# Patient Record
Sex: Female | Born: 1960 | Race: White | Hispanic: No | Smoking: Never smoker
Health system: Southern US, Community
[De-identification: ages and names within clinical notes are randomized; demographics above are authoritative.]

## PROBLEM LIST (undated history)

## (undated) DIAGNOSIS — R002 Palpitations: Secondary | ICD-10-CM

## (undated) DIAGNOSIS — I499 Cardiac arrhythmia, unspecified: Secondary | ICD-10-CM

## (undated) DIAGNOSIS — G4733 Obstructive sleep apnea (adult) (pediatric): Secondary | ICD-10-CM

## (undated) DIAGNOSIS — K219 Gastro-esophageal reflux disease without esophagitis: Secondary | ICD-10-CM

## (undated) DIAGNOSIS — I480 Paroxysmal atrial fibrillation: Secondary | ICD-10-CM

## (undated) DIAGNOSIS — Z87442 Personal history of urinary calculi: Secondary | ICD-10-CM

## (undated) DIAGNOSIS — F419 Anxiety disorder, unspecified: Secondary | ICD-10-CM

## (undated) DIAGNOSIS — E538 Deficiency of other specified B group vitamins: Secondary | ICD-10-CM

## (undated) DIAGNOSIS — T884XXA Failed or difficult intubation, initial encounter: Secondary | ICD-10-CM

## (undated) HISTORY — DX: Palpitations: R00.2

## (undated) HISTORY — DX: Obstructive sleep apnea (adult) (pediatric): G47.33

## (undated) HISTORY — PX: ESOPHAGOGASTRODUODENOSCOPY: SHX1529

## (undated) HISTORY — DX: Deficiency of other specified B group vitamins: E53.8

## (undated) HISTORY — PX: COLONOSCOPY: SHX174

## (undated) HISTORY — DX: Paroxysmal atrial fibrillation: I48.0

---

## 2001-01-25 HISTORY — PX: AUGMENTATION MAMMAPLASTY: SUR837

## 2003-01-26 HISTORY — PX: BREAST BIOPSY: SHX20

## 2004-01-26 HISTORY — PX: OTHER SURGICAL HISTORY: SHX169

## 2005-01-25 HISTORY — PX: REDUCTION MAMMAPLASTY: SUR839

## 2013-02-27 ENCOUNTER — Ambulatory Visit: Payer: Self-pay | Admitting: Internal Medicine

## 2013-07-21 DIAGNOSIS — E538 Deficiency of other specified B group vitamins: Secondary | ICD-10-CM | POA: Insufficient documentation

## 2014-02-27 DIAGNOSIS — N950 Postmenopausal bleeding: Secondary | ICD-10-CM | POA: Insufficient documentation

## 2015-07-21 DIAGNOSIS — I480 Paroxysmal atrial fibrillation: Secondary | ICD-10-CM | POA: Insufficient documentation

## 2015-07-21 DIAGNOSIS — F419 Anxiety disorder, unspecified: Secondary | ICD-10-CM | POA: Insufficient documentation

## 2015-12-25 ENCOUNTER — Other Ambulatory Visit: Payer: Self-pay | Admitting: Student

## 2015-12-25 DIAGNOSIS — R1310 Dysphagia, unspecified: Secondary | ICD-10-CM

## 2016-01-09 ENCOUNTER — Ambulatory Visit
Admission: RE | Admit: 2016-01-09 | Discharge: 2016-01-09 | Disposition: A | Discharge status: Home / Self Care | Payer: BLUE CROSS/BLUE SHIELD | Source: Ambulatory Visit | Attending: Student | Admitting: Student

## 2016-01-09 DIAGNOSIS — K449 Diaphragmatic hernia without obstruction or gangrene: Secondary | ICD-10-CM | POA: Insufficient documentation

## 2016-01-09 DIAGNOSIS — R1310 Dysphagia, unspecified: Secondary | ICD-10-CM | POA: Diagnosis present

## 2016-01-16 ENCOUNTER — Ambulatory Visit
Admission: RE | Admit: 2016-01-16 | Payer: BLUE CROSS/BLUE SHIELD | Source: Ambulatory Visit | Admitting: Unknown Physician Specialty

## 2016-01-16 ENCOUNTER — Encounter: Admission: RE | Payer: Self-pay | Source: Ambulatory Visit

## 2016-01-16 SURGERY — ESOPHAGOGASTRODUODENOSCOPY (EGD) WITH PROPOFOL
Anesthesia: General

## 2016-04-06 DIAGNOSIS — G4733 Obstructive sleep apnea (adult) (pediatric): Secondary | ICD-10-CM | POA: Insufficient documentation

## 2016-04-06 DIAGNOSIS — Z9989 Dependence on other enabling machines and devices: Secondary | ICD-10-CM

## 2016-04-06 DIAGNOSIS — R002 Palpitations: Secondary | ICD-10-CM | POA: Insufficient documentation

## 2016-04-06 DIAGNOSIS — Z780 Asymptomatic menopausal state: Secondary | ICD-10-CM | POA: Insufficient documentation

## 2016-05-11 ENCOUNTER — Other Ambulatory Visit: Payer: Self-pay | Admitting: Internal Medicine

## 2016-05-11 DIAGNOSIS — Z1231 Encounter for screening mammogram for malignant neoplasm of breast: Secondary | ICD-10-CM

## 2016-06-02 ENCOUNTER — Other Ambulatory Visit: Payer: Self-pay | Admitting: Internal Medicine

## 2016-06-02 ENCOUNTER — Ambulatory Visit
Admission: RE | Admit: 2016-06-02 | Discharge: 2016-06-02 | Disposition: A | Discharge status: Home / Self Care | Payer: BLUE CROSS/BLUE SHIELD | Source: Ambulatory Visit | Attending: Internal Medicine | Admitting: Internal Medicine

## 2016-06-02 DIAGNOSIS — Z1231 Encounter for screening mammogram for malignant neoplasm of breast: Secondary | ICD-10-CM

## 2016-09-28 ENCOUNTER — Other Ambulatory Visit: Payer: Self-pay | Admitting: Internal Medicine

## 2016-09-28 DIAGNOSIS — R1013 Epigastric pain: Secondary | ICD-10-CM

## 2017-05-13 ENCOUNTER — Other Ambulatory Visit: Payer: Self-pay | Admitting: Student

## 2017-05-13 DIAGNOSIS — M7050 Other bursitis of knee, unspecified knee: Secondary | ICD-10-CM

## 2017-05-24 DIAGNOSIS — F5104 Psychophysiologic insomnia: Secondary | ICD-10-CM | POA: Insufficient documentation

## 2017-08-08 ENCOUNTER — Encounter
Admission: RE | Admit: 2017-08-08 | Discharge: 2017-08-08 | Disposition: A | Discharge status: Home / Self Care | Payer: BLUE CROSS/BLUE SHIELD | Source: Ambulatory Visit | Attending: Orthopedic Surgery | Admitting: Orthopedic Surgery

## 2017-08-08 ENCOUNTER — Other Ambulatory Visit: Payer: Self-pay

## 2017-08-08 HISTORY — DX: Personal history of urinary calculi: Z87.442

## 2017-08-08 HISTORY — DX: Failed or difficult intubation, initial encounter: T88.4XXA

## 2017-08-08 HISTORY — DX: Gastro-esophageal reflux disease without esophagitis: K21.9

## 2017-08-08 HISTORY — DX: Anxiety disorder, unspecified: F41.9

## 2017-08-08 NOTE — Pre-Procedure Instructions (Signed)
ECG 12-lead3/13/2018 [facility] Component Name Value Ref Range  Vent Rate (bpm) 57   PR Interval (msec) 130   QRS Interval (msec) 92   QT Interval (msec) 462   QTc (msec) 449   Result Narrative  Sinus bradycardia at 57 bpm, leftward axis, normal intervals, Possible Left atrial enlargement Left ventricular hypertrophy Abnormal ECG No previous ECGs available I reviewed and concur with this report. Electronically signed [email or web address]:XWRUEby:SINGH MD, JASMINE ([id number]) on 05/02/2016 7:49:02 AM  Status Results Details   Encounter Summary

## 2017-08-08 NOTE — Patient Instructions (Signed)
Your procedure is scheduled on: 08-12-17 FRIDAY Report to Same Day Surgery 2nd floor medical mall [facility](Medical Mall Entrance-take elevator on left to 2nd floor.  Check in with surgery information desk.) To find out your arrival time please call [phone] between 1PM - 3PM on 08-11-17 THURSDAY  Remember: Instructions that are not followed completely may result in serious medical risk, up to and including death, or upon the discretion of your surgeon and anesthesiologist your surgery may need to be rescheduled.    _x___ 1. Do not eat food after midnight the night before your procedure. NO GUM OR CANDY AFTER MIDNIGHT.  You may drink clear liquids up to 2 hours before you are scheduled to arrive at the hospital for your procedure.  Do not drink clear liquids within 2 hours of your scheduled arrival to the hospital.  Clear liquids include  --Water or Apple juice without pulp  --Clear carbohydrate beverage such as ClearFast or Gatorade  --Black Coffee or Clear Tea (No milk, no creamers, do not add anything to the coffee or Tea    __x__ 2. No Alcohol for 24 hours before or after surgery.   __x__3. No Smoking or e-cigarettes for 24 prior to surgery.  Do not use any chewable tobacco products for at least 6 hour prior to surgery   ____  4. Bring all medications with you on the day of surgery if instructed.    __x__ 5. Notify your doctor if there is any change in your medical condition     (cold, fever, infections).    x___6. On the morning of surgery brush your teeth with toothpaste and water.  You may rinse your mouth with mouth wash if you wish.  Do not swallow any toothpaste or mouthwash.   Do not wear jewelry, make-up, hairpins, clips or nail polish.  Do not wear lotions, powders, or perfumes. You may wear deodorant.  Do not shave 48 hours prior to surgery. Men may shave face and neck.  Do not bring valuables to the hospital.    [facility] is not responsible for any belongings or  valuables.               Contacts, dentures or bridgework may not be worn into surgery.  Leave your suitcase in the car. After surgery it may be brought to your room.  For patients admitted to the hospital, discharge time is determined by your treatment team.  _  Patients discharged the day of surgery will not be allowed to drive home.  You will need someone to drive you home and stay with you the night of your procedure.    Please read over the following fact sheets that you were given:   [facility] Preparing [facility] and or MRSA Information   _x___ TAKE THE FOLLOWING MEDICATION THE MORNING OF SURGERY WITH A SMALL SIP OF WATER. These include:  1. OMEPRAZOLE  2. TAKE AN OMEPRAZOLE THE NIGHT BEFORE YOUR SURGERY  3.  4.  5.  6.  ____Fleets enema or Magnesium Citrate as directed.   _x___ Use CHG Soap or sage wipes as directed on instruction sheet   ____ Use inhalers on the day of surgery and bring to hospital day of surgery  ____ Stop Metformin and Janumet 2 days prior to surgery.    ____ Take 1/2 of usual insulin dose the night before surgery and none on the morning surgery.   _x___ Follow recommendations from Cardiologist, Pulmonologist or PCP regarding stopping Aspirin,  Coumadin, Plavix ,Eliquis, Effient, or Pradaxa, and Pletal-CALL DR PATELS OFFICE REGARDING YOUR ASPIRIN  X____Stop Anti-inflammatories such as Advil, Aleve, Ibuprofen, Motrin, Naproxen, Naprosyn, Goodies powders or aspirin products NOW- OK to take Tylenol    ____ Stop supplements until after surgery.    _X___ Bring C-Pap to the hospital.

## 2017-08-10 DIAGNOSIS — I4891 Unspecified atrial fibrillation: Secondary | ICD-10-CM | POA: Insufficient documentation

## 2017-08-10 DIAGNOSIS — E669 Obesity, unspecified: Secondary | ICD-10-CM | POA: Insufficient documentation

## 2017-08-11 MED ORDER — APIXABAN 5 MG PO TABS
5.00 | ORAL_TABLET | ORAL | Status: DC
Start: 2017-08-11 — End: 2017-08-11

## 2017-08-11 MED ORDER — ACETAMINOPHEN 325 MG PO TABS
650.00 | ORAL_TABLET | ORAL | Status: DC
Start: ? — End: 2017-08-11

## 2017-08-11 MED ORDER — MAGNESIUM OXIDE 400 (240 MG) MG PO TABS
400.00 | ORAL_TABLET | ORAL | Status: DC
Start: 2017-08-12 — End: 2017-08-11

## 2017-08-11 MED ORDER — ZOLPIDEM TARTRATE 5 MG PO TABS
5.00 | ORAL_TABLET | ORAL | Status: DC
Start: ? — End: 2017-08-11

## 2017-08-11 MED ORDER — ATENOLOL 50 MG PO TABS
50.00 | ORAL_TABLET | ORAL | Status: DC
Start: 2017-08-12 — End: 2017-08-11

## 2017-08-11 MED ORDER — DRONEDARONE HCL 400 MG PO TABS
400.00 | ORAL_TABLET | ORAL | Status: DC
Start: 2017-08-11 — End: 2017-08-11

## 2017-08-11 MED ORDER — PANTOPRAZOLE SODIUM 40 MG PO TBEC
40.00 | DELAYED_RELEASE_TABLET | ORAL | Status: DC
Start: 2017-08-12 — End: 2017-08-11

## 2017-08-11 MED ORDER — RANOLAZINE ER 500 MG PO TB12
500.00 | ORAL_TABLET | ORAL | Status: DC
Start: 2017-08-11 — End: 2017-08-11

## 2017-08-11 MED ORDER — ASPIRIN EC 81 MG PO TBEC
81.00 | DELAYED_RELEASE_TABLET | ORAL | Status: DC
Start: 2017-08-12 — End: 2017-08-11

## 2017-08-11 MED ORDER — ONDANSETRON 4 MG PO TBDP
4.00 | ORAL_TABLET | ORAL | Status: DC
Start: ? — End: 2017-08-11

## 2017-08-17 ENCOUNTER — Other Ambulatory Visit: Payer: Self-pay

## 2017-08-17 ENCOUNTER — Encounter (HOSPITAL_COMMUNITY): Payer: Self-pay | Admitting: Nurse Practitioner

## 2017-08-17 ENCOUNTER — Inpatient Hospital Stay
Admission: EM | Admit: 2017-08-17 | Discharge: 2017-08-20 | DRG: 390 | Disposition: A | Discharge status: Home / Self Care | Payer: BLUE CROSS/BLUE SHIELD | Attending: General Surgery | Admitting: General Surgery

## 2017-08-17 ENCOUNTER — Ambulatory Visit (HOSPITAL_COMMUNITY)
Admission: RE | Admit: 2017-08-17 | Discharge: 2017-08-17 | Disposition: A | Discharge status: Home / Self Care | Payer: BLUE CROSS/BLUE SHIELD | Source: Ambulatory Visit | Attending: Internal Medicine | Admitting: Internal Medicine

## 2017-08-17 ENCOUNTER — Encounter: Payer: Self-pay | Admitting: Emergency Medicine

## 2017-08-17 ENCOUNTER — Emergency Department: Payer: BLUE CROSS/BLUE SHIELD

## 2017-08-17 VITALS — BP 126/88 | HR 61 | Ht 65.0 in | Wt 226.0 lb

## 2017-08-17 DIAGNOSIS — G4730 Sleep apnea, unspecified: Secondary | ICD-10-CM | POA: Diagnosis present

## 2017-08-17 DIAGNOSIS — I480 Paroxysmal atrial fibrillation: Secondary | ICD-10-CM | POA: Insufficient documentation

## 2017-08-17 DIAGNOSIS — K219 Gastro-esophageal reflux disease without esophagitis: Secondary | ICD-10-CM | POA: Diagnosis not present

## 2017-08-17 DIAGNOSIS — E669 Obesity, unspecified: Secondary | ICD-10-CM | POA: Diagnosis present

## 2017-08-17 DIAGNOSIS — Z7901 Long term (current) use of anticoagulants: Secondary | ICD-10-CM

## 2017-08-17 DIAGNOSIS — F419 Anxiety disorder, unspecified: Secondary | ICD-10-CM | POA: Diagnosis not present

## 2017-08-17 DIAGNOSIS — K56609 Unspecified intestinal obstruction, unspecified as to partial versus complete obstruction: Secondary | ICD-10-CM | POA: Diagnosis not present

## 2017-08-17 DIAGNOSIS — Z7982 Long term (current) use of aspirin: Secondary | ICD-10-CM | POA: Insufficient documentation

## 2017-08-17 DIAGNOSIS — Z79899 Other long term (current) drug therapy: Secondary | ICD-10-CM | POA: Diagnosis not present

## 2017-08-17 DIAGNOSIS — Z87442 Personal history of urinary calculi: Secondary | ICD-10-CM

## 2017-08-17 DIAGNOSIS — R1012 Left upper quadrant pain: Secondary | ICD-10-CM | POA: Diagnosis present

## 2017-08-17 LAB — LACTIC ACID, PLASMA: LACTIC ACID, VENOUS: 0.9 mmol/L (ref 0.5–1.9)

## 2017-08-17 LAB — CBC
HCT: 41.3 % (ref 35.0–47.0)
HEMOGLOBIN: 14 g/dL (ref 12.0–16.0)
MCH: 29.9 pg (ref 26.0–34.0)
MCHC: 33.8 g/dL (ref 32.0–36.0)
MCV: 88.6 fL (ref 80.0–100.0)
Platelets: 351 10*3/uL (ref 150–440)
RBC: 4.67 MIL/uL (ref 3.80–5.20)
RDW: 13.4 % (ref 11.5–14.5)
WBC: 11.8 10*3/uL — ABNORMAL HIGH (ref 3.6–11.0)

## 2017-08-17 LAB — COMPREHENSIVE METABOLIC PANEL
ALBUMIN: 4.3 g/dL (ref 3.5–5.0)
ALT: 25 U/L (ref 0–44)
ANION GAP: 9 (ref 5–15)
AST: 22 U/L (ref 15–41)
Alkaline Phosphatase: 94 U/L (ref 38–126)
BILIRUBIN TOTAL: 0.7 mg/dL (ref 0.3–1.2)
BUN: 16 mg/dL (ref 6–20)
CALCIUM: 9.3 mg/dL (ref 8.9–10.3)
CO2: 27 mmol/L (ref 22–32)
Chloride: 104 mmol/L (ref 98–111)
Creatinine, Ser: 0.94 mg/dL (ref 0.44–1.00)
GFR calc Af Amer: >60 mL/min (ref >60)
GLUCOSE: 106 mg/dL — AB (ref 70–99)
Potassium: 4.1 mmol/L (ref 3.5–5.1)
Sodium: 140 mmol/L (ref 135–145)
TOTAL PROTEIN: 7.8 g/dL (ref 6.5–8.1)

## 2017-08-17 LAB — LIPASE, BLOOD: Lipase: 26 U/L (ref 11–51)

## 2017-08-17 MED ORDER — SODIUM CHLORIDE 0.9 % IV BOLUS
1000.0000 mL | Freq: Once | INTRAVENOUS | Status: AC
  Administered 2017-08-17: 1000 mL via INTRAVENOUS

## 2017-08-17 MED ORDER — ATENOLOL 50 MG PO TABS
50.0000 mg | ORAL_TABLET | Freq: Every evening | ORAL | Status: DC
  Administered 2017-08-18 – 2017-08-19 (×3): 50 mg via ORAL
  Filled 2017-08-17 (×4): qty 1

## 2017-08-17 MED ORDER — KETOROLAC TROMETHAMINE 30 MG/ML IJ SOLN: 15.0000 mg | Freq: Once | INTRAMUSCULAR | Status: DC

## 2017-08-17 MED ORDER — RANOLAZINE ER 500 MG PO TB12
500.0000 mg | ORAL_TABLET | Freq: Two times a day (BID) | ORAL | Status: DC
  Administered 2017-08-17 – 2017-08-18 (×2): 500 mg via ORAL
  Filled 2017-08-17 (×4): qty 1

## 2017-08-17 MED ORDER — ATENOLOL 50 MG PO TABS: 50.0000 mg | ORAL_TABLET | Freq: Every evening | ORAL | Status: DC

## 2017-08-17 MED ORDER — ONDANSETRON HCL 4 MG/2ML IJ SOLN
INTRAMUSCULAR | Status: AC
  Administered 2017-08-17: 4 mg via INTRAVENOUS
  Filled 2017-08-17: qty 2

## 2017-08-17 MED ORDER — FENTANYL CITRATE (PF) 100 MCG/2ML IJ SOLN
50.0000 ug | INTRAMUSCULAR | Status: DC | PRN
  Administered 2017-08-17: 50 ug via INTRAVENOUS

## 2017-08-17 MED ORDER — PROCHLORPERAZINE EDISYLATE 10 MG/2ML IJ SOLN
10.0000 mg | Freq: Once | INTRAMUSCULAR | Status: AC
  Administered 2017-08-17: 10 mg via INTRAVENOUS
  Filled 2017-08-17: qty 2

## 2017-08-17 MED ORDER — ONDANSETRON HCL 4 MG/2ML IJ SOLN: 4.0000 mg | Freq: Four times a day (QID) | INTRAMUSCULAR | Status: DC | PRN

## 2017-08-17 MED ORDER — ONDANSETRON HCL 4 MG/2ML IJ SOLN
4.0000 mg | Freq: Once | INTRAMUSCULAR | Status: AC | PRN
  Administered 2017-08-17: 4 mg via INTRAVENOUS

## 2017-08-17 MED ORDER — MORPHINE SULFATE (PF) 4 MG/ML IV SOLN
6.0000 mg | Freq: Once | INTRAVENOUS | Status: AC
  Administered 2017-08-17: 6 mg via INTRAVENOUS
  Filled 2017-08-17: qty 2

## 2017-08-17 MED ORDER — DRONEDARONE HCL 400 MG PO TABS
400.0000 mg | ORAL_TABLET | Freq: Two times a day (BID) | ORAL | Status: DC
  Filled 2017-08-17: qty 1

## 2017-08-17 MED ORDER — FENTANYL CITRATE (PF) 100 MCG/2ML IJ SOLN
INTRAMUSCULAR | Status: AC
  Administered 2017-08-17: 50 ug via INTRAVENOUS
  Filled 2017-08-17: qty 2

## 2017-08-17 MED ORDER — ZOLPIDEM TARTRATE 5 MG PO TABS
5.0000 mg | ORAL_TABLET | Freq: Every evening | ORAL | Status: DC | PRN
  Administered 2017-08-18 – 2017-08-19 (×2): 5 mg via ORAL
  Filled 2017-08-17 (×2): qty 1

## 2017-08-17 MED ORDER — DRONEDARONE HCL 400 MG PO TABS
400.0000 mg | ORAL_TABLET | Freq: Two times a day (BID) | ORAL | Status: DC
  Administered 2017-08-18 – 2017-08-19 (×3): 400 mg via ORAL
  Filled 2017-08-17 (×3): qty 1

## 2017-08-17 MED ORDER — PIPERACILLIN-TAZOBACTAM 3.375 G IVPB
3.3750 g | Freq: Three times a day (TID) | INTRAVENOUS | Status: DC
  Administered 2017-08-17 – 2017-08-19 (×6): 3.375 g via INTRAVENOUS
  Filled 2017-08-17 (×10): qty 50

## 2017-08-17 MED ORDER — CEFAZOLIN SODIUM-DEXTROSE 2-4 GM/100ML-% IV SOLN
2.0000 g | Freq: Once | INTRAVENOUS | Status: AC
  Administered 2017-08-18: 2 g via INTRAVENOUS
  Filled 2017-08-17: qty 100

## 2017-08-17 MED ORDER — LACTATED RINGERS IV SOLN
INTRAVENOUS | Status: DC
  Administered 2017-08-17 – 2017-08-19 (×2): via INTRAVENOUS

## 2017-08-17 MED ORDER — CLORAZEPATE DIPOTASSIUM 3.75 MG PO TABS
3.7500 mg | ORAL_TABLET | Freq: Every day | ORAL | Status: DC | PRN
  Filled 2017-08-17: qty 1

## 2017-08-17 NOTE — ED Notes (Signed)
This RN went into room to answer call bell. Pt asked "Did I ring out?" Friend answered that pt didn't ring but friend hit the call bell demanding that pt needs to be on the monitor since "she was just seen for a fib." Informed friend that pt had EKG done already and that pt doesn't require cardiac monitoring for abdominal pain. Still requesting cardiac monitoring. Placed pt on 5 lead. Denies any other needs at this time.

## 2017-08-17 NOTE — Progress Notes (Addendum)
Pt requesting cardiac monitoring for recent a fib diagnosis. RN spoke with MD concerning pt's request. MD states cardiac monitoring not needed at this time.  After speaking with pt, MD requested cardiac monitoring.

## 2017-08-17 NOTE — ED Notes (Signed)
Surgeon at bedside.  

## 2017-08-17 NOTE — Progress Notes (Addendum)
Primary Care Physician: Leotis Shames, MD Referring Physician:  F/u hospitalization  [facility]     Tracey Lin is a 57 y.o. female with a h/o paroxysmal afib, sleep apnea, treated with CPAP that is in the afib clinic for f/u hospitalization for afib while in [place], [place] to attend a[place]. She was walking to her car and  was aware of increased HR, dizziness and lightheadedness and was taken to [facility].  She had been out in the sun for about 30 mins and her fluid intake had been less than usual  for the day. Her HR was 170 bpm. She was thought to have mild dehydration.She was admitted, placed on Cardizem drip for a couple of days then started on Multaq, ranexa, atenolol, eliquis and did convert to SR prior to discharge. The MD told pt that she would need quick f/u as multaq was for short term as it could cause liver failure. She is planning to see Dr. Johney Frame soon. She would like to discuss coming off drugs and a possible ablation down the line. CHA2DS2VASc score is 1 for female. She would like to get clearance to have her knee surgery as she wants this first so she can get back to exercising to obtain wight loss. Echo was done at [facility] and with normal results( all records can be found in Care Everywhere).   She has had afib for many years but it had been quiet, treated in the [place] area until she moved to [place] 6 years ago. She saw Dr. Darrold Junker a couple of times but has not seen him since 2015.  She was suppose to have knee surgery last week but it was cancelled. She needs pre op clearance since she had recent hospital visit and is now on anticoagulation. She states that she has gained  40 lbs over the last 6 years. She does use cpap, but still sleeps very poorly. Feels that she does get at least 4 hours a night use.  Today, she denies symptoms of palpitations, chest pain, shortness of breath, orthopnea, PND, lower extremity edema, dizziness, presyncope,  syncope, or neurologic sequela. The patient is tolerating medications without difficulties and is otherwise without complaint today.   Past Medical History:  Diagnosis Date  . Anxiety   . Atrial fibrillation (HCC)   . Difficult intubation    "SMALL TRACHEA"  . GERD (gastroesophageal reflux disease)   . History of kidney stones    H/O  . Sleep apnea    USES CPAP   Past Surgical History:  Procedure Laterality Date  . AUGMENTATION MAMMAPLASTY  2003  . BREAST BIOPSY Right 2005   benign  . COLONOSCOPY    . ESOPHAGOGASTRODUODENOSCOPY    . REDUCTION MAMMAPLASTY  2007  . TUMMY TUCK  2006    Current Outpatient Medications  Medication Sig Dispense Refill  . apixaban (ELIQUIS) 5 MG TABS tablet Take 5 mg by mouth 2 (two) times daily.    Marland Kitchen aspirin EC 81 MG tablet Take 81 mg by mouth every evening.    Marland Kitchen atenolol (TENORMIN) 50 MG tablet Take 50 mg by mouth every evening.   3  . clorazepate (TRANXENE) 3.75 MG tablet Take 3.75 mg by mouth daily as needed for anxiety (TAKES VERY RARELY-MAYBE ONCE A MONTH).   0  . dronedarone (MULTAQ) 400 MG tablet Take 400 mg by mouth 2 (two) times daily with a meal.    . ibuprofen (ADVIL,MOTRIN) 200 MG tablet Take 600  mg by mouth daily as needed for moderate pain.    . Magnesium 400 MG CAPS Take 1 capsule by mouth daily.    Marland Kitchen. omeprazole (PRILOSEC) 40 MG capsule Take 40 mg by mouth daily as needed (acid reflux).    . ranolazine (RANEXA) 500 MG 12 hr tablet Take 500 mg by mouth 2 (two) times daily.    Marland Kitchen. zolpidem (AMBIEN) 5 MG tablet Take 5 mg by mouth at bedtime as needed for sleep.     No current facility-administered medications for this encounter.     Allergies  Allergen Reactions  . Ivp Dye [Iodinated Diagnostic Agents] Itching    Social History   Socioeconomic History  . Marital status: Divorced    Spouse name: Not on file  . Number of children: Not on file  . Years of education: Not on file  . Highest education level: Not on file    Occupational History  . Not on file  Social Needs  . Financial resource strain: Not on file  . Food insecurity:    Worry: Not on file    Inability: Not on file  . Transportation needs:    Medical: Not on file    Non-medical: Not on file  Tobacco Use  . Smoking status: Never Smoker  . Smokeless tobacco: Never Used  Substance and Sexual Activity  . Alcohol use: Yes    Frequency: Never    Comment: OCC WINE  . Drug use: Never  . Sexual activity: Not on file  Lifestyle  . Physical activity:    Days per week: Not on file    Minutes per session: Not on file  . Stress: Not on file  Relationships  . Social connections:    Talks on phone: Not on file    Gets together: Not on file    Attends religious service: Not on file    Active member of club or organization: Not on file    Attends meetings of clubs or organizations: Not on file    Relationship status: Not on file  . Intimate partner violence:    Fear of current or ex partner: Not on file    Emotionally abused: Not on file    Physically abused: Not on file    Forced sexual activity: Not on file  Other Topics Concern  . Not on file  Social History Narrative  . Not on file    No family history on file.  ROS- All systems are reviewed and negative except as per the HPI above  Physical Exam: Vitals:   08/17/17 0842  BP: 126/88  Pulse: 61  Weight: 226 lb (102.5 kg)  Height: 5\' 5"  (1.651 m)   Wt Readings from Last 3 Encounters:  08/17/17 226 lb (102.5 kg)  08/08/17 215 lb (97.5 kg)    Labs: No results found for: NA, K, CL, CO2, GLUCOSE, BUN, CREATININE, CALCIUM, PHOS, MG No results found for: INR No results found for: CHOL, HDL, LDLCALC, TRIG   GEN- The patient is well appearing, alert and oriented x 3 today.   Head- normocephalic, atraumatic Eyes-  Sclera clear, conjunctiva pink Ears- hearing intact Oropharynx- clear Neck- supple, no JVP Lymph- no cervical lymphadenopathy Lungs- Clear to ausculation  bilaterally, normal work of breathing Heart- Regular rate and rhythm, no murmurs, rubs or gallops, PMI not laterally displaced GI- soft, NT, ND, + BS Extremities- no clubbing, cyanosis, or edema MS- no significant deformity or atrophy Skin- no rash or lesion Psych- euthymic  mood, full affect Neuro- strength and sensation are intact  EKG-NSR at 61 bpm, pr int 146 ms, qrs int 84 ms, qtc 450 ms Epic records reviewed Care Everywhere    Assessment and Plan: 1. Paroxysmal afib Has had afib for many years,very quiet, but  with recent breakthrough , treated with hospitlaization in [place], [place], back in [place] with addition of  multaq Continue Multaq 400 mg bid, pt wants to stop, concerned with liver issues  Continue ranexa for now Afib triggers reviewed Can stop ASA  2. Chadsvasc score of 1 Continue eliquis 5 mg bid for now Per Dr. Johney Frame when to stop   3.Pending knee surgery Needing cardiac clearance  Will defer to Dr. Johney Frame  4. Lifestyle issues Encouraged to continue CPAP She wants to return to exercise when knee issues corrected Diet modification encouraged  F/u with Dr. Johney Frame 7/29    Elvina Sidle. [facility] [address] [place], [place] [address] [phone]

## 2017-08-17 NOTE — ED Notes (Addendum)
Patient's friend, Gaylan GeroldKim Dimuro, would like to be contacted if the patient goes to surgery.  She can be reached at [phone].

## 2017-08-17 NOTE — ED Notes (Signed)
Pt states abd pain began today around 12. States vomiting. States LUQ sore to touch. Denies diarrhea. Still has appendix and gallbladder. Has hx of kidney stones but states this pain is different. Pt friend is talking for pt. Was in hospital last week for a fib, takes blood thinners. Saw cardiologist this AM. Pt is alert, oriented. States fentanyl didn't help with pain. Friend states "this is definitely not her hurt but you need to fix her because we have a long trip coming up."

## 2017-08-17 NOTE — ED Triage Notes (Addendum)
Patient presents to the ED with epigastric pain and left sharp upper quadrant pain since noon.  Patient reports vomiting x 2.  Patient states is intermittent.  Patient has history of afib that is generally controlled.  Patient is tearful and appears very uncomfortable during triage.

## 2017-08-17 NOTE — ED Provider Notes (Signed)
[facility] Emergency Department Provider Note  ____________________________________________   First MD Initiated Contact with Patient 08/17/17 1749     (approximate)  I have reviewed the triage vital signs and the nursing notes.   HISTORY  Chief Complaint Abdominal Pain   HPI Tracey Lin is a 57 y.o. female since the emergency department with left upper quadrant abdominal pain that began roughly 5 or 6 hours prior to arrival.  The pain began gradually and has been slowly progressive and is now coming in severe "waves".  It radiates from the left upper quadrant across her upper abdomen towards the epigastric region.  She has had some nausea and vomiting.  Last bowel movement was this morning.  No history of abdominal surgeries.  She does have a recent diagnosis of atrial fibrillation however saw her cardiologist this morning and was in sinus rhythm.  She does have a history of kidney stones however she says this is different.  No dysuria frequency or hesitance.  Was given fentanyl in triage which worked briefly but the pain is recurred.    Past Medical History:  Diagnosis Date  . Anxiety   . Atrial fibrillation (HCC)   . Difficult intubation    "SMALL TRACHEA"  . GERD (gastroesophageal reflux disease)   . History of kidney stones    H/O  . Sleep apnea    USES CPAP    There are no active problems to display for this patient.   Past Surgical History:  Procedure Laterality Date  . AUGMENTATION MAMMAPLASTY  2003  . BREAST BIOPSY Right 2005   benign  . COLONOSCOPY    . ESOPHAGOGASTRODUODENOSCOPY    . REDUCTION MAMMAPLASTY  2007  . TUMMY TUCK  2006    Prior to Admission medications   Medication Sig Start Date End Date Taking? Authorizing Provider  apixaban (ELIQUIS) 5 MG TABS tablet Take 5 mg by mouth 2 (two) times daily.    [provider]  atenolol (TENORMIN) 50 MG tablet Take 50 mg by mouth every evening.  05/04/17   [provider]  clorazepate (TRANXENE) 3.75 MG tablet Take 3.75 mg by mouth daily as needed for anxiety (TAKES VERY RARELY-MAYBE ONCE A MONTH).  06/23/17   [provider]  dronedarone (MULTAQ) 400 MG tablet Take 400 mg by mouth 2 (two) times daily with a meal.    [provider]  ibuprofen (ADVIL,MOTRIN) 200 MG tablet Take 600 mg by mouth daily as needed for moderate pain.    [provider]  Magnesium 400 MG CAPS Take 1 capsule by mouth daily.    [provider]  omeprazole (PRILOSEC) 40 MG capsule Take 40 mg by mouth daily as needed (acid reflux).    [provider]  ranolazine (RANEXA) 500 MG 12 hr tablet Take 500 mg by mouth 2 (two) times daily.    [provider]  zolpidem (AMBIEN) 5 MG tablet Take 5 mg by mouth at bedtime as needed for sleep.    [provider]    Allergies Ivp dye [iodinated diagnostic agents]  No family history on file.  Social History Social History   Tobacco Use  . Smoking status: Never Smoker  . Smokeless tobacco: Never Used  Substance Use Topics  . Alcohol use: Yes    Frequency: Never    Comment: OCC WINE  . Drug use: Never    Review of Systems Constitutional: No fever/chills Eyes: No visual changes. ENT: No sore throat.  Cardiovascular: Denies chest pain. Respiratory: Denies shortness of breath. Gastrointestinal: Positive for abdominal pain.  Positive for nausea, positive for vomiting.  No diarrhea.  No constipation. Genitourinary: Negative for dysuria. Musculoskeletal: Negative for back pain. Skin: Negative for rash. Neurological: Negative for headaches, focal weakness or numbness.   ____________________________________________   PHYSICAL EXAM:  VITAL SIGNS: ED Triage Vitals  Enc Vitals Group     BP 08/17/17 1640 98/75     Pulse Rate 08/17/17 1640 60     Resp 08/17/17 1640 18     Temp 08/17/17 1640 98.4 F (36.9 C)     Temp Source 08/17/17 1640 Oral     SpO2 08/17/17  1640 100 %     Weight 08/17/17 1655 225 lb (102.1 kg)     Height 08/17/17 1655 5\' 5"  (1.651 m)     Head Circumference --      Peak Flow --      Pain Score 08/17/17 1640 10     Pain Loc --      Pain Edu? --      Excl. in GC? --     Constitutional: Alert and oriented x4 tearful and uncomfortable appearing fidgeting in bed Eyes: PERRL EOMI. Head: Atraumatic. Nose: No congestion/rhinnorhea. Mouth/Throat: No trismus Neck: No stridor.   Cardiovascular: Normal rate, regular rhythm. Grossly normal heart sounds.  Good peripheral circulation. Respiratory: Normal respiratory effort.  No retractions. Lungs CTAB and moving good air Gastrointestinal: Distended abdomen diffusely tender particularly left upper left lower quadrants no McBurney's tenderness negative Murphy's no frank peritonitis Musculoskeletal: No lower extremity edema   Neurologic:  Normal speech and language. No gross focal neurologic deficits are appreciated. Skin:  Skin is warm, dry and intact. No rash noted. Psychiatric: Extremely anxious appearing    ____________________________________________   DIFFERENTIAL includes but not limited to  Pancreatitis, gastritis, gastric reflux, appendicitis, diverticulitis, bowel obstruction ____________________________________________   LABS (all labs ordered are listed, but only abnormal results are displayed)  Labs Reviewed  COMPREHENSIVE METABOLIC PANEL - Abnormal; Notable for the following components:      Result Value   Glucose, Bld 106 (*)    All other components within normal limits  CBC - Abnormal; Notable for the following components:   WBC 11.8 (*)    All other components within normal limits  LIPASE, BLOOD  LACTIC ACID, PLASMA  URINALYSIS, COMPLETE (UACMP) WITH MICROSCOPIC  LACTIC ACID, PLASMA    Lab work reviewed by me with elevated white count which is nonspecific but could be secondary to pain or infection __________________________________________  EKG  ED  ECG REPORT I, Merrily BrittleNeil Jill Ruppe, the attending physician, personally viewed and interpreted this ECG.  Date: 08/17/2017 EKG Time:  Rate: 62 Rhythm: normal sinus rhythm QRS Axis: normal Intervals: normal ST/T Wave abnormalities: normal Narrative Interpretation: no evidence of acute ischemia  ____________________________________________  RADIOLOGY  CT abdomen pelvis without contrast reviewed by me consistent with early small bowel obstruction with possible mesenteric ischemia____________________________________________   PROCEDURES  Procedure(s) performed: no  Procedures  Critical Care performed: no  ____________________________________________   INITIAL IMPRESSION / ASSESSMENT AND PLAN / ED COURSE  Pertinent labs & imaging results that were available during my care of the patient were reviewed by me and considered in my medical decision making (see chart for details).   As part of my medical decision making, I reviewed the following data within the electronic MEDICAL RECORD NUMBER History obtained from family if available, nursing notes, old chart and ekg, as well  as notes from prior ED visits.  Patient arrives extremely uncomfortable appearing with left-sided abdominal pain and is quite tender.  Differential is broad but given the degree of her pain I do think she requires a CT scan of her abdomen pelvis.  She seems to have a true allergy to IV contrast so we will defer that at this point and begin with a CT noncontrast.  6 mg of IV morphine and 10 mg of IV Compazine for pain and nausea now.  The patient CT scan is back showing a small bowel obstruction and possible ischemia.  I will add on a lactic acid and reach out to on-call general surgery.  I spoke with Dr. Maia Plan who will kindly come evaluate and admit the patient to his service.      ____________________________________________   FINAL CLINICAL IMPRESSION(S) / ED DIAGNOSES  Final diagnoses:  Small bowel obstruction  (HCC)      NEW MEDICATIONS STARTED DURING THIS VISIT:  New Prescriptions   No medications on file     Note:  This document was prepared using Dragon voice recognition software and may include unintentional dictation errors.     Merrily Brittle, MD 08/17/17 2137

## 2017-08-17 NOTE — ED Notes (Signed)
Pt taken to CT via stretcher.

## 2017-08-17 NOTE — ED Notes (Signed)
Attempted to call report. Was told that the receiving nurse was busy and to call back in 15 minutes.

## 2017-08-17 NOTE — ED Notes (Signed)
Pt friend has gone home. Pt daughter at bedside. Has brought a suitcase, pillow, food. No distress noted at this time. Pt appears comfortable on stretcher.

## 2017-08-18 ENCOUNTER — Telehealth: Payer: Self-pay | Admitting: *Deleted

## 2017-08-18 ENCOUNTER — Ambulatory Visit (HOSPITAL_COMMUNITY): Payer: BLUE CROSS/BLUE SHIELD | Admitting: Nurse Practitioner

## 2017-08-18 ENCOUNTER — Other Ambulatory Visit: Payer: Self-pay

## 2017-08-18 DIAGNOSIS — R1012 Left upper quadrant pain: Secondary | ICD-10-CM | POA: Diagnosis present

## 2017-08-18 DIAGNOSIS — K56609 Unspecified intestinal obstruction, unspecified as to partial versus complete obstruction: Secondary | ICD-10-CM | POA: Diagnosis present

## 2017-08-18 DIAGNOSIS — I480 Paroxysmal atrial fibrillation: Secondary | ICD-10-CM | POA: Diagnosis present

## 2017-08-18 DIAGNOSIS — K219 Gastro-esophageal reflux disease without esophagitis: Secondary | ICD-10-CM | POA: Diagnosis present

## 2017-08-18 DIAGNOSIS — E669 Obesity, unspecified: Secondary | ICD-10-CM | POA: Diagnosis present

## 2017-08-18 DIAGNOSIS — Z87442 Personal history of urinary calculi: Secondary | ICD-10-CM | POA: Diagnosis not present

## 2017-08-18 DIAGNOSIS — G4730 Sleep apnea, unspecified: Secondary | ICD-10-CM | POA: Diagnosis present

## 2017-08-18 DIAGNOSIS — Z7901 Long term (current) use of anticoagulants: Secondary | ICD-10-CM | POA: Diagnosis not present

## 2017-08-18 DIAGNOSIS — F419 Anxiety disorder, unspecified: Secondary | ICD-10-CM | POA: Diagnosis present

## 2017-08-18 LAB — CBC WITH DIFFERENTIAL/PLATELET
BASOS ABS: 0 10*3/uL (ref 0–0.1)
BASOS ABS: 0 10*3/uL (ref 0–0.1)
BASOS PCT: 0 %
BASOS PCT: 0 %
EOS PCT: 1 %
Eosinophils Absolute: 0.1 10*3/uL (ref 0–0.7)
Eosinophils Absolute: 0.2 10*3/uL (ref 0–0.7)
Eosinophils Relative: 1 %
HCT: 35.3 % (ref 35.0–47.0)
HCT: 37.1 % (ref 35.0–47.0)
HEMOGLOBIN: 13.2 g/dL (ref 12.0–16.0)
Hemoglobin: 12 g/dL (ref 12.0–16.0)
LYMPHS PCT: 12 %
LYMPHS PCT: 21 %
Lymphs Abs: 1.6 10*3/uL (ref 1.0–3.6)
Lymphs Abs: 2.4 10*3/uL (ref 1.0–3.6)
MCH: 30.1 pg (ref 26.0–34.0)
MCH: 31.2 pg (ref 26.0–34.0)
MCHC: 34 g/dL (ref 32.0–36.0)
MCHC: 35.5 g/dL (ref 32.0–36.0)
MCV: 87.9 fL (ref 80.0–100.0)
MCV: 88.4 fL (ref 80.0–100.0)
Monocytes Absolute: 0.7 10*3/uL (ref 0.2–0.9)
Monocytes Absolute: 0.7 10*3/uL (ref 0.2–0.9)
Monocytes Relative: 5 %
Monocytes Relative: 7 %
NEUTROS ABS: 10.7 10*3/uL — AB (ref 1.4–6.5)
NEUTROS PCT: 82 %
NEUTROS PCT: 71 %
Neutro Abs: 8.2 10*3/uL — ABNORMAL HIGH (ref 1.4–6.5)
Platelets: 287 10*3/uL (ref 150–440)
Platelets: 300 10*3/uL (ref 150–440)
RBC: 4 MIL/uL (ref 3.80–5.20)
RBC: 4.22 MIL/uL (ref 3.80–5.20)
RDW: 13.3 % (ref 11.5–14.5)
RDW: 13.3 % (ref 11.5–14.5)
WBC: 11.4 10*3/uL — ABNORMAL HIGH (ref 3.6–11.0)
WBC: 13.2 10*3/uL — AB (ref 3.6–11.0)

## 2017-08-18 LAB — COMPREHENSIVE METABOLIC PANEL
ALBUMIN: 3.4 g/dL — AB (ref 3.5–5.0)
ALK PHOS: 71 U/L (ref 38–126)
ALT: 22 U/L (ref 0–44)
AST: 19 U/L (ref 15–41)
Anion gap: 5 (ref 5–15)
BUN: 15 mg/dL (ref 6–20)
CALCIUM: 8.3 mg/dL — AB (ref 8.9–10.3)
CHLORIDE: 106 mmol/L (ref 98–111)
CO2: 29 mmol/L (ref 22–32)
Creatinine, Ser: 0.88 mg/dL (ref 0.44–1.00)
GFR calc non Af Amer: >60 mL/min (ref >60)
GLUCOSE: 99 mg/dL (ref 70–99)
Potassium: 4.3 mmol/L (ref 3.5–5.1)
SODIUM: 140 mmol/L (ref 135–145)
Total Bilirubin: 0.8 mg/dL (ref 0.3–1.2)
Total Protein: 6 g/dL — ABNORMAL LOW (ref 6.5–8.1)

## 2017-08-18 LAB — APTT: aPTT: 33 seconds (ref 24–36)

## 2017-08-18 LAB — LACTIC ACID, PLASMA
LACTIC ACID, VENOUS: 0.7 mmol/L (ref 0.5–1.9)
LACTIC ACID, VENOUS: 0.8 mmol/L (ref 0.5–1.9)
Lactic Acid, Venous: 0.7 mmol/L (ref 0.5–1.9)

## 2017-08-18 LAB — PROTIME-INR
INR: 1.13
PROTHROMBIN TIME: 14.4 s (ref 11.4–15.2)

## 2017-08-18 NOTE — Progress Notes (Signed)
Spoke with Dr. Hazle Quantintron-Diaz regarding patient's plan of care for this evening. Patient to remain NPO through tonight to rest bowel and continue IV abx. Patient updated and aware of current plan of care.

## 2017-08-18 NOTE — Progress Notes (Signed)
Patient ID: Tracey Lin, female   DOB: 04/12/1960, 57 y.o.   MRN: [id number]     SURGICAL PROGRESS NOTE   Hospital Day(s): 0.   Post op day(s):  Marland Kitchen.   Interval History: Patient seen and examined today at 7:10 am, no acute events or new complaints overnight. Patient reports has no pain and passed gas per rectum. denies nausea and vomiting.  Vital signs in last 24 hours: [min-max] current  Temp:  [98 F (36.7 C)-98.4 F (36.9 C)] 98.3 F (36.8 C) (07/25 0358) Pulse Rate:  [53-63] 63 (07/25 0358) Resp:  [10-20] 20 (07/25 0358) BP: (98-126)/(62-88) 109/62 (07/25 0358) SpO2:  [94 %-100 %] 97 % (07/25 0358) Weight:  [102.1 kg (225 lb)-102.5 kg (226 lb)] 102.1 kg (225 lb) (07/24 1655)     Height: 5\' 5"  (165.1 cm) Weight: 102.1 kg (225 lb) BMI (Calculated): 37.44    Physical Exam:  Constitutional: alert, cooperative and no distress  Respiratory: breathing non-labored at rest  Cardiovascular: regular rate and sinus rhythm  Gastrointestinal: soft, non-tender to superficial or deep palpation, and non-distended  Labs:  CBC Latest Ref Rng & Units 08/17/2017 08/17/2017  WBC 3.6 - 11.0 K/uL 11.4(H) 11.8(H)  Hemoglobin 12.0 - 16.0 g/dL 62.113.2 30.814.0  Hematocrit 65.735.0 - 47.0 % 37.1 41.3  Platelets 150 - 440 K/uL 300 351   CMP Latest Ref Rng & Units 08/17/2017  Glucose 70 - 99 mg/dL 846(N106(H)  BUN 6 - 20 mg/dL 16  Creatinine 6.290.44 - 5.281.00 mg/dL 4.130.94  Sodium 244135 - 010145 mmol/L 140  Potassium 3.5 - 5.1 mmol/L 4.1  Chloride 98 - 111 mmol/L 104  CO2 22 - 32 mmol/L 27  Calcium 8.9 - 10.3 mg/dL 9.3  Total Protein 6.5 - 8.1 g/dL 7.8  Total Bilirubin 0.3 - 1.2 mg/dL 0.7  Alkaline Phos 38 - 126 U/L 94  AST 15 - 41 U/L 22  ALT 0 - 44 U/L 25   Imaging studies: No new pertinent imaging studies   Assessment/Plan:  [age] y.o. female with small bowel obstruction of unknown etiology. Patient refers at this moment that she does not feel any pain. She walked to the bathroom, passed gas and did not feel pain. On  physical exam, abdomen was palpated deeply and there was no pain.  Will repeat labs during the day to continue evaluation of trend, but most importantly will follow with physical exam.  Hospitalist evaluation will be appreciated for recommendations regarding atrial fibrillation and anticoagulation. Eliquis on Hold. If patient needed to be anticoagulated, I consider that we should start with heparin.   Gae GallopEdgardo Cintrn-Daz, MD

## 2017-08-18 NOTE — H&P (Signed)
SURGICAL HISTORY AND PHYSICAL NOTE   HISTORY OF PRESENT ILLNESS (HPI):  57 y.o. female presented to [facility] ED for evaluation of abdominal pain. Patient reports started with abdominal pain on 08/18/17 around 4PM. Pateitn refers pain is localized on the left upper quadrant and radiates to the epigastric area. No radiation to left lower or right side. She refers associated nausea but no vomiting. Nothing exacerbate the pain but deep palpation reproduce the pain. Pain improved with morphine. Does not associate oral intake. Denies diarrhea, or rectal bleeding. At ED workup was done. Labs shows mild leukocytosis, no electrolyte disturbances, and normal CO2 and lictic acid (no acidosis). CT scan was done and patient was found with finding of small bowel obstruction of unknown etiology. Images personally reviewed.   Patient refers has an exacerbation of her chronic atrial fibrillation last week and was admitted for that reason. Patient was discharged with treatment with atenolol, Multaq and Eliquis. Patient has been on Eliquis since a year ago. Last dose 08/17/17 in AM.   Surgery is consulted by Dr. Lamont Snowball in this context for evaluation and management of small bowel obstruction.  PAST MEDICAL HISTORY (PMH):  Past Medical History:  Diagnosis Date  . Anxiety   . Atrial fibrillation (HCC)   . Difficult intubation    "SMALL TRACHEA"  . GERD (gastroesophageal reflux disease)   . History of kidney stones    H/O  . Sleep apnea    USES CPAP     PAST SURGICAL HISTORY (PSH):  Past Surgical History:  Procedure Laterality Date  . AUGMENTATION MAMMAPLASTY  2003  . BREAST BIOPSY Right 2005   benign  . COLONOSCOPY    . ESOPHAGOGASTRODUODENOSCOPY    . REDUCTION MAMMAPLASTY  2007  . TUMMY TUCK  2006     MEDICATIONS:  Prior to Admission medications   Medication Sig Start Date End Date Taking? Authorizing Provider  apixaban (ELIQUIS) 5 MG TABS tablet Take 5 mg by mouth 2 (two) times daily.   Yes  [provider]  atenolol (TENORMIN) 50 MG tablet Take 50 mg by mouth every evening.  05/04/17  Yes [provider]  clorazepate (TRANXENE) 3.75 MG tablet Take 3.75 mg by mouth daily as needed for anxiety (TAKES VERY RARELY-MAYBE ONCE A MONTH).  06/23/17  Yes [provider]  dronedarone (MULTAQ) 400 MG tablet Take 400 mg by mouth 2 (two) times daily with a meal.   Yes [provider]  ibuprofen (ADVIL,MOTRIN) 200 MG tablet Take 600 mg by mouth daily as needed for moderate pain.   Yes [provider]  Magnesium 400 MG TABS Take 1 capsule by mouth daily.    Yes [provider]  omeprazole (PRILOSEC) 40 MG capsule Take 40 mg by mouth daily as needed (acid reflux).   Yes [provider]  ranolazine (RANEXA) 500 MG 12 hr tablet Take 500 mg by mouth 2 (two) times daily.   Yes [provider]  zolpidem (AMBIEN) 5 MG tablet Take 5 mg by mouth at bedtime as needed for sleep.   Yes [provider]     ALLERGIES:  Allergies  Allergen Reactions  . Codeine Nausea And Vomiting  . Ivp Dye [Iodinated Diagnostic Agents] Itching     SOCIAL HISTORY:  Social History   Socioeconomic History  . Marital status: Divorced    Spouse name: Not on file  . Number of children: Not on file  . Years of education: Not on file  . Highest education level:  Not on file  Occupational History  . Not on file  Social Needs  . Financial resource strain: Not on file  . Food insecurity:    Worry: Not on file    Inability: Not on file  . Transportation needs:    Medical: Not on file    Non-medical: Not on file  Tobacco Use  . Smoking status: Never Smoker  . Smokeless tobacco: Never Used  Substance and Sexual Activity  . Alcohol use: Yes    Frequency: Never    Comment: OCC WINE  . Drug use: Never  . Sexual activity: Not on file  Lifestyle  . Physical activity:    Days per week: Not on file    Minutes per session: Not on file  .  Stress: Not on file  Relationships  . Social connections:    Talks on phone: Not on file    Gets together: Not on file    Attends religious service: Not on file    Active member of club or organization: Not on file    Attends meetings of clubs or organizations: Not on file    Relationship status: Not on file  . Intimate partner violence:    Fear of current or ex partner: Not on file    Emotionally abused: Not on file    Physically abused: Not on file    Forced sexual activity: Not on file  Other Topics Concern  . Not on file  Social History Narrative  . Not on file    The patient currently resides (home / rehab facility / nursing home): Home The patient normally is (ambulatory / bedbound): Ambulatory   FAMILY HISTORY:  No family history on file.   REVIEW OF SYSTEMS:  Constitutional: denies weight loss, fever, chills, or sweats  Eyes: denies any other vision changes, history of eye injury  ENT: denies sore throat, hearing problems  Respiratory: denies shortness of breath, wheezing  Cardiovascular: denies chest pain, palpitations  Gastrointestinal: Positive for abdominal pain, and nausea  Genitourinary: denies burning with urination or urinary frequency Musculoskeletal: denies any other joint pains or cramps  Skin: denies any other rashes or skin discolorations  Neurological: denies any other headache, dizziness, weakness  Psychiatric: denies any other depression, anxiety   All other review of systems were negative   VITAL SIGNS:  Temp:  [98 F (36.7 C)-98.4 F (36.9 C)] 98 F (36.7 C) (07/24 2249) Pulse Rate:  [53-62] 55 (07/24 2249) Resp:  [10-18] 18 (07/24 2249) BP: (98-126)/(64-88) 115/71 (07/24 2249) SpO2:  [94 %-100 %] 97 % (07/24 2249) Weight:  [102.1 kg (225 lb)-102.5 kg (226 lb)] 102.1 kg (225 lb) (07/24 1655)     Height: 5\' 5"  (165.1 cm) Weight: 102.1 kg (225 lb) BMI (Calculated): 37.44   INTAKE/OUTPUT:  This shift: No intake/output data recorded.  Last 2  shifts: @IOLAST2SHIFTS @   PHYSICAL EXAM:  Constitutional:  -- Normal body habitus  -- Awake, alert, and oriented x3  Eyes:  -- Pupils equally round and reactive to light  -- No scleral icterus  Ear, nose, and throat:  -- No jugular venous distension  Pulmonary:  -- No crackles  -- Equal breath sounds bilaterally -- Breathing non-labored at rest Cardiovascular:  -- S1, S2 present  -- No pericardial rubs Gastrointestinal:  -- Abdomen soft, tender to palpation on left upper quadrant, non-distended, no guarding or rebound tenderness -- No abdominal masses appreciated, pulsatile or otherwise  Musculoskeletal and Integumentary:  -- Wounds or skin  discoloration: None appreciated -- Extremities: B/L UE and LE FROM, hands and feet warm, no edema  Neurologic:  -- Motor function: intact and symmetric -- Sensation: intact and symmetric   Labs:  CBC Latest Ref Rng & Units 08/17/2017 08/17/2017  WBC 3.6 - 11.0 K/uL 11.4(H) 11.8(H)  Hemoglobin 12.0 - 16.0 g/dL 16.1 09.6  Hematocrit 04.5 - 47.0 % 37.1 41.3  Platelets 150 - 440 K/uL 300 351   CMP Latest Ref Rng & Units 08/17/2017  Glucose 70 - 99 mg/dL 409(W)  BUN 6 - 20 mg/dL 16  Creatinine 1.19 - 1.47 mg/dL 8.29  Sodium 562 - 130 mmol/L 140  Potassium 3.5 - 5.1 mmol/L 4.1  Chloride 98 - 111 mmol/L 104  CO2 22 - 32 mmol/L 27  Calcium 8.9 - 10.3 mg/dL 9.3  Total Protein 6.5 - 8.1 g/dL 7.8  Total Bilirubin 0.3 - 1.2 mg/dL 0.7  Alkaline Phos 38 - 126 U/L 94  AST 15 - 41 U/L 22  ALT 0 - 44 U/L 25   Coagulation:  Lab Results  Component Value Date   INR 1.13 08/17/2017   APTT 33 08/17/2017   Lactic Acid, Venous    Component Value Date/Time   LATICACIDVEN 0.9 08/17/2017 1946    Imaging studies:  EXAM: CT ABDOMEN AND PELVIS WITHOUT CONTRAST  TECHNIQUE: Multidetector CT imaging of the abdomen and pelvis was performed following the standard protocol without IV contrast.  COMPARISON:  None.  FINDINGS: Lower chest: No  acute abnormality.  Hepatobiliary: 1.7 cm gallstone. Gallbladder appears otherwise normal with no evidence of cholecystitis. No focal liver abnormality is seen. No bile duct dilatation.  Pancreas: Unremarkable. No pancreatic ductal dilatation or surrounding inflammatory changes.  Spleen: Normal in size without focal abnormality.  Adrenals/Urinary Tract: Adrenal glands appear normal. 5 mm stone within the proximal RIGHT ureter causing minimal to mild RIGHT-sided hydronephrosis. Additional 2 mm RIGHT renal stone. LEFT kidney appears normal without mass, stone or hydronephrosis. Bladder appears normal, partially decompressed.  Stomach/Bowel: Moderately distended small bowel loops within the central abdomen and LEFT upper quadrant, measuring up to 3.7 cm diameter, with associated air-fluid levels. There are relatively decompressed small bowel loops within the lower abdomen indicating small bowel obstruction. Associated mesenteric inflammation/fluid stranding within the mesentery adjacent to the distended small bowel loops. Transition zone is seen within the LEFT lower quadrant, where there is thickening of the walls of the small bowel.  Scattered diverticulosis within the sigmoid and lower descending colon without evidence of acute diverticulitis.  Vascular/Lymphatic: No significant vascular findings are present. No enlarged abdominal or pelvic lymph nodes.  Reproductive: Uterus and bilateral adnexa are unremarkable.  Other: No abscess collections seen.  No free intraperitoneal air.  Musculoskeletal: No acute or suspicious osseous finding.  IMPRESSION: 1. Moderately distended small bowel loops within the central abdomen and LEFT upper quadrant, with associated air-fluid levels, indicating small-bowel obstruction. This could be a partial or early complete obstruction, favor partial obstruction. Small bowel wall thickening is seen at the transition zone in the LEFT  lower quadrant, suggesting that the obstruction is due to enteritis of infectious, inflammatory or ischemic nature. No obstructing mass seen. 2. Of note, there is also inflammation/fluid stranding within the mesentery between the distended small bowel loops within the upper abdomen which can be a secondary sign of an adjacent small bowel ischemia. No pneumatosis intestinalis, portal venous gas or other confirming signs of a small bowel ischemia are seen. 3. At minimum, recommend close clinical follow-up, and  follow-up CT as needed, to exclude early developing small bowel ischemia as the cause of the small bowel obstruction. 4. 5 mm stone within the proximal RIGHT ureter, suspected to be an incidental finding given the lack of RIGHT flank pain, causing only minimal to mild RIGHT-sided hydronephrosis. 5. 2 mm RIGHT renal stone. 6. Cholelithiasis without evidence of acute cholecystitis. 7. Colonic diverticulosis without evidence of acute diverticulitis.   Electronically Signed   By: Bary Richard M.D.   On: 08/17/2017 18:39  Assessment/Plan:  57 y.o. female with complex case of small bowel obstruction, complicated by pertinent comorbidities including atrial fibrillation on anticoagulation.  Patient with CT scan finding of small bowel dilation of several bowel loops. With her history of recent atrial fibrillation episode there is a concern of embolic event to small bowel. Patient does not has acidosis, normal lactate, mild leukocytosis that is not worsening, the second time that I evaluated the patient in a 2 hour period patient is out of pain. Patient is on sinus rhythm, no tachycardia, normal blood pressure.  Patient was oriented about the CT scan finding and the multiple possibilities of her small bowel obstruction, ischemia, infectious, inflammatory, malignancy, among others. Patient oriented about the alternative of conservative management trial vs diagnostic laparoscopy. Patient  oriented about the high risk for surgery at this moment due to anticoagulation which she took today. After a long conversation and offering patient the diagnostic laparoscopy with possible exploratory laparotomy, patient prefers to proceed with conservative management trial. Patient oriented that if the labs, physical exam, pain or any sign of deterioration, will need surgical management. Will follow closely with serial physical exam and serial labs. Patient will be NPO, bowel rest, IV antibiotic started. Will consult Hospitalist for atrial fibrillation recommendation patient being NPO.     All of the above findings and recommendations were discussed with the patient and her daughter, and all of patient's and her family's questions were answered to their expressed satisfaction.  Gae Gallop, MD

## 2017-08-18 NOTE — Progress Notes (Signed)
Patient had large brown BM this morning, feeling better since but mild nausea intermittently. Patient still NPO sips with meds. NSR on telemetry. Hospitalist consult pending. Patient is aware of current plan of care. Will continue to closely monitor.

## 2017-08-18 NOTE — Telephone Encounter (Signed)
   [facility] Pre-operative Risk Assessment    Request for surgical clearance:  1. What type of surgery is being performed?   Left knee arthroscopy with partial  medial menisectomy open pes  anserine   2. When is this surgery scheduled? TBD   3. What type of clearance is required (medical clearance vs. Pharmacy clearance to hold med vs. Both)? Medical & Pharmacy  4. Are there any medications that need to be held prior to surgery and how long?  Blood thinner - Eliquis   5. Practice name and name of physician performing surgery?   [facility]  Dr. Leim Fabry   6. What is your office phone number  [phone]    7.   What is your office fax number  [phone]  8.   Anesthesia type (None, local, MAC, general) ?    Tracey Lin 08/18/2017, 10:42 AM  _________________________________________________________________  Pt is scheduled to see Dr. [name] on Monday 7/29   (provider comments below)

## 2017-08-18 NOTE — Progress Notes (Signed)
Patient ID: Jennette DubinBarbara A Ebrahimi, female   DOB: 05/21/1960, 57 y.o.   MRN: [id number]  Patient re evaluated at 1:10pm. Patient refers feeling good. Denies abdominal pain. She has ambulated without pain. Upon deep palpation there is no pain. Will continue serial abdominal exams. New labs show normal lactic acid with some increase in WBC count. Since patient continue asymptomatic, will hold surgical management. Patient refers continue passing gas and had a bowel movement.

## 2017-08-18 NOTE — Progress Notes (Signed)
Patient ID: Tracey Lin, female   DOB: 07/21/1960, 57 y.o.   MRN: [id number] Patient re evaluated at 2:15 am. The WBC remain the same from 11.8 to 11.4. No increase in lactic acid which is in normal parameters 0.8. Physical exam is benign with minimal discomfort. No pain out of proportion from physical exam. No nausea or vomiting. Will continue with serial physical exam and labs. Patient oriented that if deteriorates will need surgery.

## 2017-08-19 ENCOUNTER — Encounter: Payer: Self-pay | Admitting: Internal Medicine

## 2017-08-19 LAB — CBC WITH DIFFERENTIAL/PLATELET
Basophils Absolute: 0 K/uL (ref 0–0.1)
Basophils Relative: 0 %
Eosinophils Absolute: 0.2 K/uL (ref 0–0.7)
Eosinophils Relative: 3 %
HCT: 37.1 % (ref 35.0–47.0)
Hemoglobin: 12.7 g/dL (ref 12.0–16.0)
Lymphocytes Relative: 39 %
Lymphs Abs: 2.5 K/uL (ref 1.0–3.6)
MCH: 30.5 pg (ref 26.0–34.0)
MCHC: 34.4 g/dL (ref 32.0–36.0)
MCV: 88.8 fL (ref 80.0–100.0)
Monocytes Absolute: 0.4 K/uL (ref 0.2–0.9)
Monocytes Relative: 7 %
Neutro Abs: 3.4 K/uL (ref 1.4–6.5)
Neutrophils Relative %: 51 %
Platelets: 272 K/uL (ref 150–440)
RBC: 4.17 MIL/uL (ref 3.80–5.20)
RDW: 13 % (ref 11.5–14.5)
WBC: 6.6 K/uL (ref 3.6–11.0)

## 2017-08-19 LAB — HIV ANTIBODY (ROUTINE TESTING W REFLEX): HIV Screen 4th Generation wRfx: NONREACTIVE

## 2017-08-19 MED ORDER — ACETAMINOPHEN 500 MG PO TABS
1000.0000 mg | ORAL_TABLET | Freq: Four times a day (QID) | ORAL | Status: DC | PRN
  Administered 2017-08-19: 1000 mg via ORAL
  Filled 2017-08-19: qty 2

## 2017-08-19 MED ORDER — DRONEDARONE HCL 400 MG PO TABS
400.0000 mg | ORAL_TABLET | Freq: Two times a day (BID) | ORAL | Status: DC
  Administered 2017-08-19 – 2017-08-20 (×2): 400 mg via ORAL
  Filled 2017-08-19 (×3): qty 1

## 2017-08-19 MED ORDER — RANOLAZINE ER 500 MG PO TB12
500.0000 mg | ORAL_TABLET | Freq: Two times a day (BID) | ORAL | Status: DC
  Administered 2017-08-19 – 2017-08-20 (×3): 500 mg via ORAL
  Filled 2017-08-19 (×4): qty 1

## 2017-08-19 NOTE — Progress Notes (Signed)
Patient ID: Tracey DubinBarbara A Troop, female   DOB: 12/31/1960, 57 y.o.   MRN: [id number]     SURGICAL PROGRESS NOTE   Patient evaluated and found stable. Patient refers continue without pain. Today had clear liquids and full liquid for supper. Refers did not increased pain and continued to have bowel movements. Patient refers wants to be discharged. I discussed with her about the importance to go slowly and progress diet to solid food to see if she tolerates without pain. Agree with patient that if she tolerates soft diet tomorrow may go home later during the day.

## 2017-08-19 NOTE — Telephone Encounter (Signed)
Pt has an OV with Dr Johney FrameAllred 7/29. Pre op clearance and recommendations on holding anticoagulation can be addressed then.  Corine ShelterLUKE Alexzandria Massman PA-C 08/19/2017 11:20 AM

## 2017-08-19 NOTE — Progress Notes (Signed)
Patient ID: Tracey Lin, female   DOB: 04/10/1960, 57 y.o.   MRN: [id number]     SURGICAL PROGRESS NOTE   Hospital Day(s): 1.   Post op day(s):  Marland Kitchen.   Interval History: Patient seen and examined, no acute events or new complaints overnight. Patient reports continue without pain, denies nausea or vomiting. Denies any pain. Refers continue passing gas and having bowel movements.  Vital signs in last 24 hours: [min-max] current  Temp:  [98.1 F (36.7 C)-98.9 F (37.2 C)] 98.2 F (36.8 C) (07/26 0836) Pulse Rate:  [57-66] 60 (07/26 0836) Resp:  [18-20] 18 (07/26 0836) BP: (98-124)/(53-78) 124/78 (07/26 0836) SpO2:  [97 %-99 %] 97 % (07/26 0836)     Height: 5\' 5"  (165.1 cm) Weight: 102.1 kg (225 lb) BMI (Calculated): 37.44   Physical Exam:  Constitutional: alert, cooperative and no distress  Respiratory: breathing non-labored at rest  Cardiovascular: regular rate and sinus rhythm  Gastrointestinal: soft, non-tender, and non-distended  Labs:  CBC Latest Ref Rng & Units 08/19/2017 08/18/2017 08/17/2017  WBC 3.6 - 11.0 K/uL 6.6 13.2(H) 11.4(H)  Hemoglobin 12.0 - 16.0 g/dL 40.912.7 81.112.0 91.413.2  Hematocrit 35.0 - 47.0 % 37.1 35.3 37.1  Platelets 150 - 440 K/uL 272 287 300   CMP Latest Ref Rng & Units 08/18/2017 08/17/2017  Glucose 70 - 99 mg/dL 99 782(N106(H)  BUN 6 - 20 mg/dL 15 16  Creatinine 5.620.44 - 1.00 mg/dL 1.300.88 8.650.94  Sodium 784135 - 145 mmol/L 140 140  Potassium 3.5 - 5.1 mmol/L 4.3 4.1  Chloride 98 - 111 mmol/L 106 104  CO2 22 - 32 mmol/L 29 27  Calcium 8.9 - 10.3 mg/dL 8.3(L) 9.3  Total Protein 6.5 - 8.1 g/dL 6.0(L) 7.8  Total Bilirubin 0.3 - 1.2 mg/dL 0.8 0.7  Alkaline Phos 38 - 126 U/L 71 94  AST 15 - 41 U/L 19 22  ALT 0 - 44 U/L 22 25    Imaging studies: No new pertinent imaging studies   Assessment/Plan:  57 y.o. female with small bowel obstruction of unknown etiology. Patient refers continue completely asymptomatic. There is no pain. Patient continue to pass gas. Today WBC count  normalized to 6.6. Will start clear liquid diet.  Hospitalist evaluation will be appreciated for recommendations regarding atrial fibrillation and anticoagulation. Eliquis on Hold. If patient needed to be anticoagulated, I consider that we should start with heparin  Gae GallopEdgardo Cintrn-Daz, MD

## 2017-08-19 NOTE — Consult Note (Signed)
[facility] [place] [facility]   PATIENT NAME: Tracey NgBarbara Lin    MR#:  [id number]  DATE OF BIRTH:  06/14/1960  DATE OF ADMISSION:  08/17/2017  PRIMARY CARE PHYSICIAN: Leotis ShamesSingh, Jasmine, MD   REQUESTING/REFERRING PHYSICIAN: Dr Catalina PizzaEdgar Cintron-Diaz  CHIEF COMPLAINT:   Chief Complaint  Patient presents with  . Abdominal Pain    HISTORY OF PRESENT ILLNESS:  Tracey NgBarbara Lin  is a 57 y.o. female with a known history of atrial fibrillation.  Patient was admitted yesterday by surgical team for small bowel obstruction. Patient feeling much better today.  Medical consult was called for anticoagulation.  The patient was just recently started on Eliquis last week when she was [place] to the [place] in [place] and was placed on [place] and Eliquis at that time.  She stated she had some constipation at that time but went home and was able to have a bowel movement.  She presented after projectile vomiting and severe epigastric and left upper quadrant pain.  She went to the urgent care center and then ended up in the ER and being admitted by the surgical team.  Today she is passed gas and had a bowel movement.  She is tolerated the liquid diet so far.    PAST MEDICAL HISTORY:   Past Medical History:  Diagnosis Date  . Anxiety   . Atrial fibrillation (HCC)   . Difficult intubation    "SMALL TRACHEA"  . GERD (gastroesophageal reflux disease)   . History of kidney stones    H/O  . Sleep apnea    USES CPAP    PAST SURGICAL HISTORY:   Past Surgical History:  Procedure Laterality Date  . AUGMENTATION MAMMAPLASTY  2003  . BREAST BIOPSY Right 2005   benign  . COLONOSCOPY    . ESOPHAGOGASTRODUODENOSCOPY    . REDUCTION MAMMAPLASTY  2007  . TUMMY TUCK  2006    SOCIAL HISTORY:   Social History   Tobacco Use  . Smoking status: Never Smoker  . Smokeless tobacco: Never Used  Substance Use Topics  . Alcohol use: Yes    Frequency: Never    Comment: OCC WINE     FAMILY HISTORY:   Family History  Problem Relation Age of Onset  . CAD Mother   . Pancreatic cancer Father     DRUG ALLERGIES:   Allergies  Allergen Reactions  . Codeine Nausea And Vomiting  . Ivp Dye [Iodinated Diagnostic Agents] Itching    REVIEW OF SYSTEMS:  CONSTITUTIONAL: No fever, chills or sweats.  Positive for fatigue.  EYES: No blurred or double vision.  EARS, NOSE, AND THROAT: No tinnitus or ear pain. No sore throat RESPIRATORY: No cough, shortness of breath, wheezing or hemoptysis.  CARDIOVASCULAR: No chest pain, orthopnea, edema.  GASTROINTESTINAL: Positive for nausea and  Vomiting.  Positive for abdominal pain. No blood in bowel movements GENITOURINARY: No dysuria, hematuria.  ENDOCRINE: No polyuria, nocturia,  HEMATOLOGY: No anemia, easy bruising or bleeding SKIN: No rash or lesion. MUSCULOSKELETAL: No joint pain or arthritis.   NEUROLOGIC: No tingling, numbness, weakness.  PSYCHIATRY: No anxiety or depression.  Positive for insomnia  MEDICATIONS AT HOME:   Prior to Admission medications   Medication Sig Start Date End Date Taking? Authorizing Provider  apixaban (ELIQUIS) 5 MG TABS tablet Take 5 mg by mouth 2 (two) times daily.   Yes [provider]  atenolol (TENORMIN) 50 MG tablet Take 50 mg by mouth every evening.  05/04/17  Yes [provider]  clorazepate (TRANXENE) 3.75 MG tablet Take 3.75 mg by mouth daily as needed for anxiety (TAKES VERY RARELY-MAYBE ONCE A MONTH).  06/23/17  Yes [provider]  dronedarone (MULTAQ) 400 MG tablet Take 400 mg by mouth 2 (two) times daily with a meal.   Yes [provider]  ibuprofen (ADVIL,MOTRIN) 200 MG tablet Take 600 mg by mouth daily as needed for moderate pain.   Yes [provider]  Magnesium 400 MG TABS Take 1 capsule by mouth daily.    Yes [provider]  omeprazole (PRILOSEC) 40 MG capsule Take 40 mg by mouth daily as needed (acid reflux).   Yes  [provider]  ranolazine (RANEXA) 500 MG 12 hr tablet Take 500 mg by mouth 2 (two) times daily.   Yes [provider]  zolpidem (AMBIEN) 5 MG tablet Take 5 mg by mouth at bedtime as needed for sleep.   Yes [provider]      VITAL SIGNS:  Blood pressure 100/70, pulse 61, temperature 98.1 F (36.7 C), temperature source Oral, resp. rate 18, height 5\' 5"  (1.651 m), weight 102.1 kg (225 lb), last menstrual period 08/09/2011, SpO2 98 %.  PHYSICAL EXAMINATION:  GENERAL:  57 y.o.-year-old patient lying in the bed with no acute distress.  EYES: Pupils equal, round, reactive to light and accommodation. No scleral icterus. Extraocular muscles intact.  HEENT: Head atraumatic, normocephalic. Oropharynx and nasopharynx clear.  NECK:  Supple, no jugular venous distention. No thyroid enlargement, no tenderness.  LUNGS: Normal breath sounds bilaterally, no wheezing, rales,rhonchi or crepitation. No use of accessory muscles of respiration.  CARDIOVASCULAR: S1, S2 normal. No murmurs, rubs, or gallops.  ABDOMEN: Soft, nontender, nondistended. Bowel sounds present. No organomegaly or mass.  EXTREMITIES: No pedal edema, cyanosis, or clubbing.  NEUROLOGIC: Cranial nerves II through XII are intact. Muscle strength 5/5 in all extremities. Sensation intact. Gait not checked.  PSYCHIATRIC: The patient is alert and oriented x 3.  SKIN: No rash, lesion, or ulcer.   LABORATORY PANEL:   CBC Recent Labs  Lab 08/19/17 0549  WBC 6.6  HGB 12.7  HCT 37.1  PLT 272   ------------------------------------------------------------------------------------------------------------------  Chemistries  Recent Labs  Lab 08/18/17 1120  NA 140  K 4.3  CL 106  CO2 29  GLUCOSE 99  BUN 15  CREATININE 0.88  CALCIUM 8.3*  AST 19  ALT 22  ALKPHOS 71  BILITOT 0.8    ------------------------------------------------------------------------------------------------------------------    RADIOLOGY:  Ct Abdomen Pelvis Wo Contrast  Result Date: 08/17/2017 CLINICAL DATA:  Epigastric pain and sharp LEFT upper quadrant pain since noon. Vomiting x2. EXAM: CT ABDOMEN AND PELVIS WITHOUT CONTRAST TECHNIQUE: Multidetector CT imaging of the abdomen and pelvis was performed following the standard protocol without IV contrast. COMPARISON:  None. FINDINGS: Lower chest: No acute abnormality. Hepatobiliary: 1.7 cm gallstone. Gallbladder appears otherwise normal with no evidence of cholecystitis. No focal liver abnormality is seen. No bile duct dilatation. Pancreas: Unremarkable. No pancreatic ductal dilatation or surrounding inflammatory changes. Spleen: Normal in size without focal abnormality. Adrenals/Urinary Tract: Adrenal glands appear normal. 5 mm stone within the proximal RIGHT ureter causing minimal to mild RIGHT-sided hydronephrosis. Additional 2 mm RIGHT renal stone. LEFT kidney appears normal without mass, stone or hydronephrosis. Bladder appears normal, partially decompressed. Stomach/Bowel: Moderately distended small bowel loops within the central abdomen and LEFT upper quadrant, measuring up to 3.7 cm diameter, with associated air-fluid levels. There are relatively decompressed small bowel loops within the lower  abdomen indicating small bowel obstruction. Associated mesenteric inflammation/fluid stranding within the mesentery adjacent to the distended small bowel loops. Transition zone is seen within the LEFT lower quadrant, where there is thickening of the walls of the small bowel. Scattered diverticulosis within the sigmoid and lower descending colon without evidence of acute diverticulitis. Vascular/Lymphatic: No significant vascular findings are present. No enlarged abdominal or pelvic lymph nodes. Reproductive: Uterus and bilateral adnexa are unremarkable. Other: No  abscess collections seen.  No free intraperitoneal air. Musculoskeletal: No acute or suspicious osseous finding. IMPRESSION: 1. Moderately distended small bowel loops within the central abdomen and LEFT upper quadrant, with associated air-fluid levels, indicating small-bowel obstruction. This could be a partial or early complete obstruction, favor partial obstruction. Small bowel wall thickening is seen at the transition zone in the LEFT lower quadrant, suggesting that the obstruction is due to enteritis of infectious, inflammatory or ischemic nature. No obstructing mass seen. 2. Of note, there is also inflammation/fluid stranding within the mesentery between the distended small bowel loops within the upper abdomen which can be a secondary sign of an adjacent small bowel ischemia. No pneumatosis intestinalis, portal venous gas or other confirming signs of a small bowel ischemia are seen. 3. At minimum, recommend close clinical follow-up, and follow-up CT as needed, to exclude early developing small bowel ischemia as the cause of the small bowel obstruction. 4. 5 mm stone within the proximal RIGHT ureter, suspected to be an incidental finding given the lack of RIGHT flank pain, causing only minimal to mild RIGHT-sided hydronephrosis. 5. 2 mm RIGHT renal stone. 6. Cholelithiasis without evidence of acute cholecystitis. 7. Colonic diverticulosis without evidence of acute diverticulitis. Electronically Signed   By: Bary Elner Seifert M.D.   On: 08/17/2017 18:39    EKG:   Normal sinus rhythm 62 bpm  IMPRESSION AND PLAN:   1.  Paroxysmal atrial fibrillation.  Currently in normal sinus rhythm.  Continue Multaq and atenolol.  Restart Eliquis as soon as we know she definitely does not need surgery. 2.  Sleep apnea and obesity.  Patient refused CPAP here.  Has it at home. 3.  History of B12 deficiency 4.  GERD on PPI  5.  Anxiety on Tranxene 6.  Small bowel obstruction seems to be resolving.  Hopefully the patient  will be discharged tomorrow if tolerates diet.  All the records are reviewed and case discussed with admitting provider. Management plans discussed with the patient,  and she is in agreement.  CODE STATUS: Full code  TOTAL TIME TAKING CARE OF THIS PATIENT: 50 minutes.  Case discussed with Dr. Hazle Quant and I will sign off at this time.   Alford Highland M.D on 08/19/2017 at 2:39 PM  Between 7am to 6pm - Pager - [phone]  After 6pm call admission pager [phone]  [facility] Office  [phone]  CC: Primary care physician; Leotis Shames, MD

## 2017-08-20 MED ORDER — APIXABAN 5 MG PO TABS
5.0000 mg | ORAL_TABLET | Freq: Two times a day (BID) | ORAL | Status: DC
  Administered 2017-08-20: 5 mg via ORAL
  Filled 2017-08-20 (×2): qty 1

## 2017-08-20 NOTE — Discharge Summary (Signed)
Physician Discharge Summary  Patient ID: Tracey DubinBarbara A Munger MRN: [id number] DOB/AGE: 56/03/1960 56 y.o.  Admit date: 08/17/2017 Discharge date: 08/20/2017  Admission Diagnoses: Small bowel obstruction, enteritis  Discharge Diagnoses:  Active Problems:   Small bowel obstruction (HCC)   SBO (small bowel obstruction) (HCC)   Discharged Condition: good  Hospital Course: Patient admitted due to small bowel obstruction. Due to her history of atrial fibrillation and recent start of anticoagulation it was concerned that the enteritis was due to ischemia. Diagnostic laparoscopy was discussed with patient but since the pain was improving and patient was taking Eliquis, it was decided to be held. Serial physical exam and labs were done and patient continue to improve. Patient started to pass gas per rectum and having bowel movement. Since the patient completely improved and the obstruction resolved, patient was started on clear liquids and advanced slowly. Diet advanced up to soft diet. Today patient tolerated pancakes and bacon. Tolerated well without pain.   Consults: Hospitalist  Significant Diagnostic Studies: CT scan  Treatments: IV hydration, antibiotics: Zosyn and analgesia: Morphine and Fentanyl  Discharge Exam: Blood pressure 125/72, pulse 60, temperature 98.2 F (36.8 C), temperature source Oral, resp. rate 18, height 5\' 5"  (1.651 m), weight 102.1 kg (225 lb), last menstrual period 08/09/2011, SpO2 99 %. General appearance: alert and cooperative Resp: clear to auscultation bilaterally Cardio: regular rate and rhythm, S1, S2 normal, no murmur, click, rub or gallop GI: soft, non-tender; bowel sounds normal; no masses,  no organomegaly  Disposition: Discharge disposition: 01-Home or Self Care       Discharge Instructions    Diet - low sodium heart healthy   Complete by:  As directed    Increase activity slowly   Complete by:  As directed      Allergies as of 08/20/2017    Reactions   Codeine Nausea And Vomiting   Ivp Dye [iodinated Diagnostic Agents] Itching      Medication List    TAKE these medications   atenolol 50 MG tablet Commonly known as:  TENORMIN Take 50 mg by mouth every evening.   clorazepate 3.75 MG tablet Commonly known as:  TRANXENE Take 3.75 mg by mouth daily as needed for anxiety (TAKES VERY RARELY-MAYBE ONCE A MONTH).   dronedarone 400 MG tablet Commonly known as:  MULTAQ Take 400 mg by mouth 2 (two) times daily with a meal.   ELIQUIS 5 MG Tabs tablet Generic drug:  apixaban Take 5 mg by mouth 2 (two) times daily.   ibuprofen 200 MG tablet Commonly known as:  ADVIL,MOTRIN Take 600 mg by mouth daily as needed for moderate pain.   Magnesium 400 MG Tabs Take 1 capsule by mouth daily.   omeprazole 40 MG capsule Commonly known as:  PRILOSEC Take 40 mg by mouth daily as needed (acid reflux).   ranolazine 500 MG 12 hr tablet Commonly known as:  RANEXA Take 500 mg by mouth 2 (two) times daily.   zolpidem 5 MG tablet Commonly known as:  AMBIEN Take 5 mg by mouth at bedtime as needed for sleep.        Signed: Carolan Shiverdgardo Cintron-Diaz 08/20/2017, 8:51 AM

## 2017-08-20 NOTE — Discharge Instructions (Signed)
°  Diet: Resume home heart healthy soft diet.   Activity: Increase activity as tolerated.  Medications: Resume all home medications. For mild to moderate pain: acetaminophen (Tylenol) or ibuprofen (if no kidney disease).   Call office [phone]) at any time if any questions, worsening pain, fevers/chills, bleeding, drainage from incision site, or other concerns.

## 2017-08-20 NOTE — Progress Notes (Signed)
Pt discharged home.  Discharge instructions, prescriptions and follow up appointment given to and reviewed with pt.  Pt verbalized understanding.  Escorted by auxillary. 

## 2017-08-22 ENCOUNTER — Ambulatory Visit (INDEPENDENT_AMBULATORY_CARE_PROVIDER_SITE_OTHER): Payer: BLUE CROSS/BLUE SHIELD | Admitting: Internal Medicine

## 2017-08-22 ENCOUNTER — Encounter (INDEPENDENT_AMBULATORY_CARE_PROVIDER_SITE_OTHER): Payer: Self-pay

## 2017-08-22 ENCOUNTER — Encounter: Payer: Self-pay | Admitting: Internal Medicine

## 2017-08-22 VITALS — BP 122/70 | HR 57 | Ht 65.0 in | Wt 216.0 lb

## 2017-08-22 DIAGNOSIS — M722 Plantar fascial fibromatosis: Secondary | ICD-10-CM | POA: Insufficient documentation

## 2017-08-22 DIAGNOSIS — D649 Anemia, unspecified: Secondary | ICD-10-CM | POA: Insufficient documentation

## 2017-08-22 DIAGNOSIS — F410 Panic disorder [episodic paroxysmal anxiety] without agoraphobia: Secondary | ICD-10-CM | POA: Insufficient documentation

## 2017-08-22 DIAGNOSIS — I480 Paroxysmal atrial fibrillation: Secondary | ICD-10-CM | POA: Diagnosis not present

## 2017-08-22 DIAGNOSIS — G4733 Obstructive sleep apnea (adult) (pediatric): Secondary | ICD-10-CM | POA: Diagnosis not present

## 2017-08-22 NOTE — Telephone Encounter (Signed)
I s/w Dr. Learta CoddingSunny Patel's office [facility] and informed that the pt's surgery will need to be postponed. Per Dr. Johney FrameAllred pt cannot come off Eliquis x 4 weeks after Cardioversion. Pt was seen in our today by Dr. Johney FrameAllred. Dr. Learta CoddingSunny Patel's office has been notified surgery needs to be postponed. I will remove from Pre Op Call Back pool for this time being. Pt will be calling back when she is ready to schedule he surgery.

## 2017-08-22 NOTE — Telephone Encounter (Signed)
   Primary Cardiologist: Hillis RangeJames Allred, MD  Chart reviewed as part of pre-operative protocol coverage. Patient was contacted 08/22/2017 in reference to pre-operative risk assessment for pending surgery as outlined below.  Pt has had recent atrial fib and cannot come off her Eliquis for 4 weeks after cardioversion.  The pt will call us when she is ready to proceed with surgery of Medial menisectomy.  I spoke to Dr. Johney FrameAllred today and while low risk for procedure she cannot stop Eliquis for now.      Pre-op covering staff: - Please schedule appointment and call patient to inform them. - Please contact requesting surgeon's office via preferred method (i.e, phone, fax) to inform them of need for appointment prior to surgery.  Nada BoozerLaura Justis Dupas, NP 08/22/2017, 4:26 PM

## 2017-08-22 NOTE — Progress Notes (Signed)
Electrophysiology Office Note   Date:  08/22/2017   ID:  Tracey Lin, DOB 1960-10-30, MRN [id number]  PCP:  Leotis Shames, MD  Cardiologist:  None (saw Dr Darrold Junker several years ago) Primary Electrophysiologist: Hillis Range, MD    CC: afib   History of Present Illness: Tracey Lin is a 57 y.o. female who presents today for electrophysiology evaluation.   She is referred by Tracey Lin with the AF clinic for EP consultation regarding her afib.  She reports being diagnosed with atrial fibrillation 15 years ago after presenting with palpitations and fatigue.  She has been treated with atenolol and ASA with good result.  She has not had afib for over 6 years until 2 weeks ago when she had afib and was admitted to [facility] hospital with afib while at a [place].   She was hospitalized for 3 days.  She was started on multaq, ranexa, and eliquis.  She converted to sinus after 2 days.  She has had no further afib.  Today, she denies symptoms of palpitations, chest pain, shortness of breath, orthopnea, PND, lower extremity edema, claudication, dizziness, presyncope, syncope, bleeding, or neurologic sequela. The patient is tolerating medications without difficulties and is otherwise without complaint today.    Past Medical History:  Diagnosis Date  . Anxiety   . Difficult intubation    "SMALL TRACHEA"  . GERD (gastroesophageal reflux disease)   . History of kidney stones    H/O  . OSA (obstructive sleep apnea)    USES CPAP  . Paroxysmal atrial fibrillation [facility])    Past Surgical History:  Procedure Laterality Date  . AUGMENTATION MAMMAPLASTY  2003  . BREAST BIOPSY Right 2005   benign  . COLONOSCOPY    . ESOPHAGOGASTRODUODENOSCOPY    . REDUCTION MAMMAPLASTY  2007  . TUMMY TUCK  2006     Current Outpatient Medications  Medication Sig Dispense Refill  . atenolol (TENORMIN) 50 MG tablet Take 50 mg by mouth every evening.   3  . clorazepate (TRANXENE) 3.75 MG  tablet Take 3.75 mg by mouth daily as needed for anxiety (TAKES VERY RARELY-MAYBE ONCE A MONTH).   0  . ibuprofen (ADVIL,MOTRIN) 200 MG tablet Take 600 mg by mouth daily as needed for moderate pain.    . Magnesium 400 MG TABS Take 1 capsule by mouth daily.     Marland Kitchen omeprazole (PRILOSEC) 40 MG capsule Take 40 mg by mouth daily as needed (acid reflux).    Marland Kitchen zolpidem (AMBIEN) 5 MG tablet Take 5 mg by mouth at bedtime as needed for sleep.     No current facility-administered medications for this visit.     Allergies:   Codeine and Ivp dye [iodinated diagnostic agents]   Social History:  The patient  reports that she has never smoked. She has never used smokeless tobacco. She reports that she drinks alcohol. She reports that she does not use drugs.   Family History:  The patient's  family history includes CAD in her mother; Pancreatic cancer in her father.    ROS:  Please see the history of present illness.   All other systems are personally reviewed and negative.    PHYSICAL EXAM: VS:  BP 122/70   Pulse (!) 57   Ht 5\' 5"  (1.651 m)   Wt 216 lb (98 kg)   LMP 08/09/2011 (Approximate) Comment: PT STILL SPOTS AS OF 08-08-17  SpO2 99%   BMI 35.94 kg/m  , BMI Body mass  index is 35.94 kg/m. GEN: Well nourished, well developed, in no acute distress  HEENT: normal  Neck: no JVD, carotid bruits, or masses Cardiac: RRR; no murmurs, rubs, or gallops,no edema  Respiratory:  clear to auscultation bilaterally, normal work of breathing GI: soft, nontender, nondistended, + BS MS: no deformity or atrophy  Skin: warm and dry  Neuro:  Strength and sensation are intact Psych: euthymic mood, full affect  EKG:  EKG is ordered today. The ekg ordered today is personally reviewed and shows sinus rhythm 61 bpm, PR 146 msec, QRS 84 msec, Qtc 450 msec   Recent Labs: 08/18/2017: ALT 22; BUN 15; Creatinine, Ser 0.88; Potassium 4.3; Sodium 140 08/19/2017: Hemoglobin 12.7; Platelets 272  personally reviewed    Lipid Panel  No results found for: CHOL, TRIG, HDL, CHOLHDL, VLDL, LDLCALC, LDLDIRECT personally reviewed   Wt Readings from Last 3 Encounters:  08/22/17 216 lb (98 kg)  08/17/17 225 lb (102.1 kg)  08/17/17 226 lb (102.5 kg)      Other studies personally reviewed: Additional studies/ records that were reviewed today include: AF clinic notes, prior echo  Review of the above records today demonstrates: as above   ASSESSMENT AND PLAN:  1.  Paroxysmal atrial fibrillation The patient has symptomatic recurrent afib.  She has not had but one episode in the past 6 years. We discussed options at length.  At this time, she would prefer a conservative approach.  She would like to stop ranexa and multaq.  She may consider long term AADs or ablation of she has more frequent afib.  I think that this is reasonable. Stop ranexa today Stop multaq if no afib in 2 weeks chads2vasc score is 1.  Stop eliquis 4 weeks after cardioversion as per guidelines  2. OSA Compliant with CPAP She has difficulty with sleep which she thinks is her biggest trigger for afib. At her request, I will refer to Dr Terrace ArabiaYan for management of sleep apnea.   Follow-up:  AF clinic in 6 weeks  Current medicines are reviewed at length with the patient today.   The patient does not have concerns regarding her medicines.  The following changes were made today:  none  Labs/ tests ordered today include:  Orders Placed This Encounter  Procedures  . Ambulatory referral to Sleep Studies    Signed, Hillis RangeJames Katelynn Heidler, MD  08/22/2017 9:42 AM     [facility] [address] [place] [address] [phone] (office) [phone] (fax)

## 2017-08-22 NOTE — Patient Instructions (Addendum)
Medication Instructions:  Your physician has recommended you make the following change in your medication:  1.  Stop taking Ranexa today 2.  Stop taking Eliquis in 2 weeks on September 05, 2017 3.  Stop taking Multaq in 2 weeks on September 05, 2017  Labwork: None ordered.  Testing/Procedures: None ordered.  Follow-Up: Your physician wants you to follow-up in: 6 weeks [facility]   Any Other Special Instructions Will Be Listed Below (If Applicable).  If you need a refill on your cardiac medications before your next appointment, please call your pharmacy.

## 2017-08-26 ENCOUNTER — Encounter: Admission: RE | Payer: Self-pay | Source: Ambulatory Visit

## 2017-08-26 ENCOUNTER — Ambulatory Visit
Admission: RE | Admit: 2017-08-26 | Payer: BLUE CROSS/BLUE SHIELD | Source: Ambulatory Visit | Admitting: Orthopedic Surgery

## 2017-08-26 SURGERY — ARTHROSCOPY, KNEE, WITH MEDIAL MENISCECTOMY
Anesthesia: Choice | Laterality: Left

## 2017-08-29 ENCOUNTER — Institutional Professional Consult (permissible substitution): Payer: BLUE CROSS/BLUE SHIELD | Admitting: Internal Medicine

## 2017-08-30 NOTE — Addendum Note (Signed)
Encounter addended by: Newman Niparroll, Donna C, NP on: 08/30/2017 11:18 AM  Actions taken: LOS modified

## 2017-10-03 ENCOUNTER — Encounter (HOSPITAL_COMMUNITY): Payer: Self-pay | Admitting: Nurse Practitioner

## 2017-10-03 ENCOUNTER — Ambulatory Visit (HOSPITAL_COMMUNITY)
Admission: RE | Admit: 2017-10-03 | Discharge: 2017-10-03 | Disposition: A | Discharge status: Home / Self Care | Payer: BLUE CROSS/BLUE SHIELD | Source: Ambulatory Visit | Attending: Nurse Practitioner | Admitting: Nurse Practitioner

## 2017-10-03 VITALS — BP 116/74 | HR 65 | Ht 60.0 in | Wt 216.0 lb

## 2017-10-03 DIAGNOSIS — R9431 Abnormal electrocardiogram [ECG] [EKG]: Secondary | ICD-10-CM | POA: Diagnosis not present

## 2017-10-03 DIAGNOSIS — G4733 Obstructive sleep apnea (adult) (pediatric): Secondary | ICD-10-CM | POA: Insufficient documentation

## 2017-10-03 DIAGNOSIS — R42 Dizziness and giddiness: Secondary | ICD-10-CM | POA: Insufficient documentation

## 2017-10-03 DIAGNOSIS — Z7982 Long term (current) use of aspirin: Secondary | ICD-10-CM | POA: Diagnosis not present

## 2017-10-03 DIAGNOSIS — F419 Anxiety disorder, unspecified: Secondary | ICD-10-CM | POA: Insufficient documentation

## 2017-10-03 DIAGNOSIS — K219 Gastro-esophageal reflux disease without esophagitis: Secondary | ICD-10-CM | POA: Insufficient documentation

## 2017-10-03 DIAGNOSIS — Z885 Allergy status to narcotic agent status: Secondary | ICD-10-CM | POA: Insufficient documentation

## 2017-10-03 DIAGNOSIS — I4439 Other atrioventricular block: Secondary | ICD-10-CM | POA: Diagnosis not present

## 2017-10-03 DIAGNOSIS — Z79899 Other long term (current) drug therapy: Secondary | ICD-10-CM | POA: Insufficient documentation

## 2017-10-03 DIAGNOSIS — Z8249 Family history of ischemic heart disease and other diseases of the circulatory system: Secondary | ICD-10-CM | POA: Insufficient documentation

## 2017-10-03 DIAGNOSIS — I517 Cardiomegaly: Secondary | ICD-10-CM | POA: Diagnosis not present

## 2017-10-03 DIAGNOSIS — Z87442 Personal history of urinary calculi: Secondary | ICD-10-CM | POA: Insufficient documentation

## 2017-10-03 DIAGNOSIS — I480 Paroxysmal atrial fibrillation: Secondary | ICD-10-CM | POA: Diagnosis not present

## 2017-10-03 NOTE — Progress Notes (Signed)
Primary Care Physician: Leotis Shames, MD Referring Physician:  F/u hospitalization  [facility] hospital     Tracey Lin is a 57 y.o. female with a h/o paroxysmal afib, sleep apnea, treated with CPAP that is in the afib clinic for f/u hospitalization for afib while in [place], [place] to attend a[place] rally. She was walking to her car and  was aware of increased HR, dizziness and lightheadedness and was taken to [facility].  She had been out in the sun for about 30 mins and her fluid intake had been less than usual  for the day. Her HR was 170 bpm. She was thought to have mild dehydration.She was admitted, placed on Cardizem drip for a couple of days then started on Multaq, ranexa, atenolol, eliquis and did convert to SR prior to discharge. The MD told pt that she would need quick f/u as multaq was for short term as it could cause liver failure. She is planning to see Dr. Johney Frame soon. She would like to discuss coming off drugs and a possible ablation down the line. CHA2DS2VASc score is 1 for female. She would like to get clearance to have her knee surgery as she wants this first so she can get back to exercising to obtain wight loss. Echo was done at [facility] and with normal results( all records can be found in Care Everywhere).   She has had afib for many years but it had been quiet, treated in the [place] area until she moved to [place] 6 years ago. She saw Dr. Darrold Junker a couple of times but has not seen him since 2015.  She was suppose to have knee surgery last week but it was cancelled.  She states that she has gained  40 lbs over the last 6 years. She does use cpap, but still sleeps very poorly. Feels that she does get at least 4 hours a night use.  F/u in afib clinic 9/9. When she saw Dr. Johney Frame, he advised her to start weaning off the ranexa, multaq and DOAC which she has successfully done. She  feels well. No further afib. She did have a SBO which self released right after I  saw her last in clinic. No further reoccurrence.   Today, she denies symptoms of palpitations, chest pain, shortness of breath, orthopnea, PND, lower extremity edema, dizziness, presyncope, syncope, or neurologic sequela. The patient is tolerating medications without difficulties and is otherwise without complaint today.   Past Medical History:  Diagnosis Date  . Anxiety   . Difficult intubation    "SMALL TRACHEA"  . GERD (gastroesophageal reflux disease)   . History of kidney stones    H/O  . OSA (obstructive sleep apnea)    USES CPAP  . Paroxysmal atrial fibrillation [facility])    Past Surgical History:  Procedure Laterality Date  . AUGMENTATION MAMMAPLASTY  2003  . BREAST BIOPSY Right 2005   benign  . COLONOSCOPY    . ESOPHAGOGASTRODUODENOSCOPY    . REDUCTION MAMMAPLASTY  2007  . TUMMY TUCK  2006    Current Outpatient Medications  Medication Sig Dispense Refill  . aspirin EC 81 MG tablet Take 81 mg by mouth daily.    Marland Kitchen atenolol (TENORMIN) 50 MG tablet Take 50 mg by mouth every evening.   3  . clorazepate (TRANXENE) 3.75 MG tablet Take 3.75 mg by mouth daily as needed for anxiety (TAKES VERY RARELY-MAYBE ONCE A MONTH).   0  . ibuprofen (ADVIL,MOTRIN) 200 MG tablet  Take 600 mg by mouth daily as needed for moderate pain.    . Magnesium 400 MG TABS Take 1 capsule by mouth daily.     Marland Kitchen omeprazole (PRILOSEC) 40 MG capsule Take 40 mg by mouth daily as needed (acid reflux).    Marland Kitchen zolpidem (AMBIEN) 5 MG tablet Take 5 mg by mouth at bedtime as needed for sleep.     No current facility-administered medications for this encounter.     Allergies  Allergen Reactions  . Codeine Nausea And Vomiting  . Ivp Dye [Iodinated Diagnostic Agents] Itching    Social History   Socioeconomic History  . Marital status: Divorced    Spouse name: Not on file  . Number of children: Not on file  . Years of education: Not on file  . Highest education level: Not on file  Occupational History  . Not  on file  Social Needs  . Financial resource strain: Not on file  . Food insecurity:    Worry: Not on file    Inability: Not on file  . Transportation needs:    Medical: Not on file    Non-medical: Not on file  Tobacco Use  . Smoking status: Never Smoker  . Smokeless tobacco: Never Used  Substance and Sexual Activity  . Alcohol use: Yes    Frequency: Never    Comment: OCC WINE  . Drug use: Never  . Sexual activity: Not on file  Lifestyle  . Physical activity:    Days per week: Not on file    Minutes per session: Not on file  . Stress: Not on file  Relationships  . Social connections:    Talks on phone: Not on file    Gets together: Not on file    Attends religious service: Not on file    Active member of club or organization: Not on file    Attends meetings of clubs or organizations: Not on file    Relationship status: Not on file  . Intimate partner violence:    Fear of current or ex partner: Not on file    Emotionally abused: Not on file    Physically abused: Not on file    Forced sexual activity: Not on file  Other Topics Concern  . 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 CAD Mother   . Pancreatic cancer Father     ROS- All systems are reviewed and negative except as per the HPI above  Physical Exam: Vitals:   10/03/17 1538  BP: 116/74  Pulse: 65  Weight: 98 kg  Height: 5' (1.524 m)   Wt Readings from Last 3 Encounters:  10/03/17 98 kg  08/22/17 98 kg  08/17/17 102.1 kg    Labs: Lab Results  Component Value Date   NA 140 08/18/2017   K 4.3 08/18/2017   CL 106 08/18/2017   CO2 29 08/18/2017   GLUCOSE 99 08/18/2017   BUN 15 08/18/2017   CREATININE 0.88 08/18/2017   CALCIUM 8.3 (L) 08/18/2017   Lab Results  Component Value Date   INR 1.13 08/17/2017   No results found for: CHOL, HDL, LDLCALC,  TRIG   GEN- The patient is well appearing, alert and oriented x 3 today.   Head- normocephalic, atraumatic Eyes-  Sclera clear, conjunctiva pink Ears- hearing intact  Oropharynx- clear Neck- supple, no JVP Lymph- no cervical lymphadenopathy Lungs- Clear to ausculation bilaterally, normal work of breathing Heart- Regular rate and rhythm, no murmurs, rubs or gallops, PMI not laterally displaced GI- soft, NT, ND, + BS Extremities- no clubbing, cyanosis, or edema MS- no significant deformity or atrophy Skin- no rash or lesion Psych- euthymic mood, full affect Neuro- strength and sensation are intact  EKG-NSR at 65  bpm, pr int 146 ms, qrs int 84 ms, qtc 450 ms Epic records reviewed Care Everywhere    Assessment and Plan: 1. Paroxysmal afib Has had afib for many years,very quiet, but  with recent breakthrough, treated with hospitlaization in [place], [place], back in SR with addition of  multaq Now has weaned off multaq, ranexa and DOAC and is feeling well, no further afib Afib triggers reviewed  2. Chadsvasc score of 1 Now off eliquis  4. Lifestyle issues Encouraged to continue CPAP Diet modification encouraged  F/u with afib clinic as needed  Lupita Leash C. [facility] [address] [place], [place] [address] [phone]

## 2017-10-11 ENCOUNTER — Encounter: Payer: Self-pay | Admitting: Neurology

## 2017-10-12 ENCOUNTER — Encounter: Payer: Self-pay | Admitting: Neurology

## 2017-10-12 ENCOUNTER — Ambulatory Visit (INDEPENDENT_AMBULATORY_CARE_PROVIDER_SITE_OTHER): Payer: BLUE CROSS/BLUE SHIELD | Admitting: Neurology

## 2017-10-12 VITALS — BP 135/87 | HR 67 | Ht 65.0 in | Wt 213.0 lb

## 2017-10-12 DIAGNOSIS — G4733 Obstructive sleep apnea (adult) (pediatric): Secondary | ICD-10-CM | POA: Diagnosis not present

## 2017-10-12 DIAGNOSIS — F5104 Psychophysiologic insomnia: Secondary | ICD-10-CM

## 2017-10-12 DIAGNOSIS — Z6834 Body mass index (BMI) 34.0-34.9, adult: Secondary | ICD-10-CM

## 2017-10-12 DIAGNOSIS — F419 Anxiety disorder, unspecified: Secondary | ICD-10-CM

## 2017-10-12 DIAGNOSIS — I480 Paroxysmal atrial fibrillation: Secondary | ICD-10-CM

## 2017-10-12 DIAGNOSIS — R0683 Snoring: Secondary | ICD-10-CM

## 2017-10-12 DIAGNOSIS — E6609 Other obesity due to excess calories: Secondary | ICD-10-CM

## 2017-10-12 DIAGNOSIS — K56609 Unspecified intestinal obstruction, unspecified as to partial versus complete obstruction: Secondary | ICD-10-CM | POA: Diagnosis not present

## 2017-10-12 MED ORDER — ALPRAZOLAM 0.5 MG PO TABS: 0.2500 mg | ORAL_TABLET | Freq: Every evening | ORAL | 0 refills | Status: DC | PRN

## 2017-10-12 NOTE — Progress Notes (Signed)
SLEEP MEDICINE CLINIC   Provider:  Melvyn Novas, [place] D  Primary Care Physician:  Leotis Shames, MD   Referring Provider: Dr. Hillis Range, MD  Cardiology   Chief Complaint  Patient presents with  . New Patient (Initial Visit)    pt alone, rm 10. pt has sleep apnea and uses CPAP , but doesn't like it.-  17th July 2019 she was hospitalized for Atrial fib at the [facility], [place] for 3 days. On 7-24 she was hospitalized at [facility] for presumed small bowel obstruction, has again tested for atrial fibrillation. She was on Eliquis at that time.  Didn't use the machine and stopped using the machine- she struggles with sleep using the machine. Marland Kitchen     HPI:  Tracey Lin is a 57 y.o. female patient of [place] descent , seen by Dr. Lynnell Chad  Seen on 10-12-2017  in a referral for re-evaluation of treatment options of sleep apnea.   Chief complaint according to patient : " I am used to functioning with little sleep- it's normal for me" . "I never slept well on CPAP ever, tried different masks".  Tracey Lin is a 57 year old Caucasian female patient, right-handed, who works as a Veterinary surgeon. She was recently evaluated by Dr. Johney Frame for atrial fibrillation.  She was diagnosed first time about 15 years ago after she presented with palpitations and had been very fatigued. At the time she was also sen for a PSG.  Rate control was achieved with atenolol and she used aspirin.  She had not had symptoms of atrial fibrillation for well over 6 years until mid July when she started to have another bout and was admitted to [facility] in [place].  She had suffered the atrial fibrillation on the way to an emotional MAGA rally for the election of the [place]th [place] destrict- usually stress or emotional upheaval is a trigger for her atrial fib. She had rapid ventricular response.   Her heart rate is now controlled, she remained 30 days on  anticoagulation- not longer on Eliquis. We will hydrate only the patient was also taking off Ranexa and Multaq.  She has discontinued CPAP use a while back because she did not feel any better using it.  Her initial sleep study cannot be reviewed here today but she had a repeat sleep study in May 20, 2016, while living in the [place].  She was at [facility].  The sleep study showed an overall AHI of only 6 an hour, total desaturation time was 3.4% of the total night, average oxygen oxygen saturation was 94.5%.  And she snored loudly over 30 dB for almost 40% of the night.  The findings are consistent with a mild non-positional obstructive sleep apnea.  There was not enough supine sleep time to assess positional component.  This was actually a attended sleep test a home sleep test.  Home sleep test are scored by behavioral criteria which means how much the patient moves.  Often this is mistaken.  Patient may be resting very still and the device will rates this at the time of sleep and is not, on the contrary a restless sleeper may be told that he or she does not sleep at all and they actually are.  So the interpreting physician stated that the patient should be educated on sleep hygiene, maintain ideal body weight and follow up with the referring physician.   He recommended auto CPAP  between 5 and 15 cmH2O pressure or alternatively a mandibular advancement splint.   The patient actually received a new CPAP and she has hated it ever since.   She wants to discuss alternatives to CPAP treatment. She considers herself claustrophobic.   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 She leaves about 3 hours between dinner and bed time.  She aims for a 10 -11 PM sleep time- she winds down and goes to sleep in less than 30 minutes.  She snores. She has apneas when on her back. She doesn't recall dreaming.  She falls asleep reading or watching  TV, but wakes at 3 or 4 AM.  She has 2-4 nocturias. The bedroom is cool, quiet and dark. The patient uses an adjustable bed, sleeps on her side on one pillow.    Sleep medical history and family sleep history: son  Has sleep apnea, overweight. Patient has been told that she is difficult to intubate, has a small airway, small trachea. .  Social history: divorced , sleeps alone, 2 children adult son in [place], daughter lives in the same [place]. Non smoker- never has - ETOH: once a month.  Caffeine , coffee 1 mug in AM and sometimes after lunch.  Sodas none- tea rarely.   Review of Systems:  vivid nightmares on trazodone.  Insomnia on Ambien.  Insomnia- hypervigilance.   Out of a complete 14 system review, the patient complains of only the following symptoms, and all other reviewed systems are negative. Epworth score 2/ 24  , Fatigue severity score 24. 63   , depression score n/a   Social History   Socioeconomic History  . Marital status: Divorced    Spouse name: Not on file  . Number of children: Not on file  . Years of education: Not on file  . Highest education level: Not on file  Occupational History  . Not on file  Social Needs  . Financial resource strain: Not on file  . Food insecurity:    Worry: Not on file    Inability: Not on file  . Transportation needs:    Medical: Not on file    Non-medical: Not on file  Tobacco Use  . Smoking status: Never Smoker  . Smokeless tobacco: Never Used  Substance and Sexual Activity  . Alcohol use: Yes    Frequency: Never    Comment: OCC WINE  . Drug use: Never  . Sexual activity: Not on file  Lifestyle  . Physical activity:    Days per week: Not on file    Minutes per session: Not on file  . Stress: Not on file  Relationships  . Social connections:    Talks on phone: Not on file    Gets together: Not on file    Attends religious service: Not on file    Active member of club or organization: Not on file    Attends meetings of clubs  or organizations: Not on file    Relationship status: Not on file  . Intimate partner violence:    Fear of current or ex partner: Not on file    Emotionally abused: Not on file    Physically abused: Not on file    Forced sexual activity: Not on file  Other Topics Concern  . Not on file  Social History Narrative   Lives in [place]    Works as a Community education officer of  [employer]    Family History  Problem Relation Age of Onset  . CAD Mother   . Pancreatic cancer Father     Past Medical History:  Diagnosis Date  . Anxiety   . Difficult intubation    "SMALL TRACHEA"  . GERD (gastroesophageal reflux disease)   . History of kidney stones    H/O  . OSA (obstructive sleep apnea)    USES CPAP  . Paroxysmal atrial fibrillation [facility])     Past Surgical History:  Procedure Laterality Date  . AUGMENTATION MAMMAPLASTY  2003  . BREAST BIOPSY Right 2005   benign  . COLONOSCOPY    . ESOPHAGOGASTRODUODENOSCOPY    . REDUCTION MAMMAPLASTY  2007  . TUMMY TUCK  2006    Current Outpatient Medications  Medication Sig Dispense Refill  . aspirin EC 81 MG tablet Take 81 mg by mouth daily.    Marland Kitchen atenolol (TENORMIN) 50 MG tablet Take 50 mg by mouth every evening.   3  . clorazepate (TRANXENE) 3.75 MG tablet Take 3.75 mg by mouth daily as needed for anxiety (TAKES VERY RARELY-MAYBE ONCE A MONTH).   0  . zolpidem (AMBIEN) 5 MG tablet Take 5 mg by mouth at bedtime as needed for sleep.     No current facility-administered medications for this visit.     Allergies as of 10/12/2017 - Review Complete 10/12/2017  Allergen Reaction Noted  . Codeine Nausea And Vomiting 08/17/2017  . Ivp dye [iodinated diagnostic agents] Itching 08/08/2017    Vitals: LMP 08/09/2011 (Approximate) Comment: PT STILL SPOTS AS OF 08-08-17 Last Weight:  Wt Readings from Last 1 Encounters:  10/03/17 216 lb (98 kg)   WUJ:WJXBJ is no height or weight on file to calculate BMI.     Last Height:   Ht  Readings from Last 1 Encounters:  10/03/17 5' (1.524 m)    Physical exam:  General: The patient is awake, alert and appears not in acute distress. The patient is well groomed. Head: Normocephalic, atraumatic. Neck is supple. Mallampati 5- very narrow airway  neck circumference: 15  . Nasal airflow patent - status post septoplasty,  . Retrognathia is seen.  Cardiovascular:  Regular rate and rhythm , without  murmurs or carotid bruit, and without distended neck veins. Respiratory: Lungs are clear to auscultation. Skin:  Without evidence of edema, or rash Trunk: BMI is 35. The patient's posture is erect   Neurologic exam : The patient is awake and alert, oriented to place and time.   Memory subjective described as intact. Attention span & concentration ability appears normal.  Speech is fluent,  without  dysarthria, dysphonia or aphasia.  Mood and affect are alert.  Cranial nerves: Pupils are equal and briskly reactive to light.  Funduscopic exam without  evidence of pallor or edema. Extraocular movements  in vertical and horizontal planes intact and without nystagmus. Visual fields by finger perimetry are intact. Hearing to finger rub intact.   Facial sensation intact to fine touch.  Facial motor strength is symmetric and tongue and uvula move midline. Shoulder shrug was symmetrical.   Motor exam:   Normal tone, muscle bulk and symmetric strength in all extremities. Sensory:  Fine touch, pinprick and vibration were tested in all extremities. Proprioception tested in the upper extremities was normal. Coordination: Finger-to-nose maneuver  normal without evidence of ataxia, dysmetria or tremor. Gait and station: Patient walks without assistive device and is able unassisted to climb up to the exam table. Strength within  normal limits. Stance is stable and normal .Tandem gait is unfragmented. Turns with 3 Steps. Romberg testing is negative. Deep tendon reflexes: in the  upper and lower  extremities are symmetric and intact. Babinski maneuver response is downgoing.  I reviewed her  ED and cardiology records, labs, and EKG tracings.  The patient's lab results from 08-10-2017 showed normal levels of all parts of the comprehensive metabolic panel except for the glucose at 141.  This was not a fasting glucose.  The patient's blood tests were taken after she arrived at the emergency room in the afternoon hours.  She had normal liver function tests AST 14.5 ALT 11.0 she had a normal glomerular filtration rate troponin levels were normal at 0.03.  Urine urine analysis was complete and normal.  Twelve-lead EKG showed atrial fibrillation with abnormal R wave progression, obliterated P wave, early transition QRS area.  Heart rate was 146 bpm.    Diagnosis was atrial fibrillation with rapid ventricular response.    Assessment:  After physical and neurologic examination, review of laboratory studies,  Personal review of imaging studies, reports of other /same  Imaging studies, results of polysomnography and / or neurophysiology testing and pre-existing records as far as provided in visit., my assessment is   1) OSA was mild at AHI 6 /h- but associated with snoring, not hypoxemia. This was a HST during a time this patient had not had symptoms of atrial fib.  2) Paroxysmal atrial fib is the main reason to treat even mild apnea- otherwise I wouldn't recommend CPAP at all.   3) insomnia - explained  Electronics to get out of the bedroom 30 minutes of wind down,  Hot shower, bedroom is to be cool and quiet and dark.  Keep a bedtime before midnight.  Ambien ok for prn-  Xanax prn is actually less likely to affect her the next day.   3) CPAP intolerant - lets see in an attended sleep study if she is in need of therapy at all- based on the results she may be referred to an INSPIRE procedure, dental device , or just weight loss.  I will order a referral to medical weight management.      The  patient was advised of the nature of the diagnosed disorder , the treatment options and the  risks for general health and wellness arising from not treating the condition.   I spent more than 45  minutes of face to face time with the patient.  Greater than 50% of time was spent in counseling and coordination of care. We have discussed the diagnosis and differential and I answered the patient's questions.    Plan:  Treatment plan and additional workup :   1) Anxiety , address with lifestyle changes, Xanax low dose prn for panic attacks. This will help with insomnia.   2) CPAP intolerance- need to confirm OSA in a PSG attended study. Alternative therapies are :  Inspire procedure, dental device and weight loss.     Melvyn NovasARMEN Jermya Dowding, MD 10/12/2017, 1:02 PM  Certified in Neurology by ABPN Certified in Sleep Medicine by [facility]  [facility] [address], [address] [place], [place] [id number]

## 2017-10-12 NOTE — Patient Instructions (Signed)

## 2017-11-16 ENCOUNTER — Ambulatory Visit (INDEPENDENT_AMBULATORY_CARE_PROVIDER_SITE_OTHER): Payer: BLUE CROSS/BLUE SHIELD | Admitting: Neurology

## 2017-11-16 DIAGNOSIS — G4733 Obstructive sleep apnea (adult) (pediatric): Secondary | ICD-10-CM | POA: Diagnosis not present

## 2017-11-16 DIAGNOSIS — F419 Anxiety disorder, unspecified: Secondary | ICD-10-CM

## 2017-11-16 DIAGNOSIS — I480 Paroxysmal atrial fibrillation: Secondary | ICD-10-CM

## 2017-11-16 DIAGNOSIS — G4761 Periodic limb movement disorder: Secondary | ICD-10-CM

## 2017-11-16 DIAGNOSIS — E6609 Other obesity due to excess calories: Secondary | ICD-10-CM

## 2017-11-16 DIAGNOSIS — Z6834 Body mass index (BMI) 34.0-34.9, adult: Secondary | ICD-10-CM

## 2017-11-16 DIAGNOSIS — K56609 Unspecified intestinal obstruction, unspecified as to partial versus complete obstruction: Secondary | ICD-10-CM

## 2017-11-16 DIAGNOSIS — R0683 Snoring: Secondary | ICD-10-CM

## 2017-11-16 DIAGNOSIS — F5104 Psychophysiologic insomnia: Secondary | ICD-10-CM

## 2017-11-21 DIAGNOSIS — R0683 Snoring: Secondary | ICD-10-CM | POA: Insufficient documentation

## 2017-11-21 NOTE — Procedures (Signed)
PATIENT'S NAME:  Tracey Lin, Tracey Lin DOB:      October 02, 1960      MR#:    [id number]     DATE OF RECORDING: 11/16/2017 REFERRING M.D.:  Hillis Range MD Study Performed:   Baseline Polysomnogram HISTORY:   57 year old Caucasian female patient, right-handed, who works as a Veterinary surgeon. She was recently evaluated by Dr. Johney Frame for atrial fibrillation.  She was diagnosed first time about 15 years ago after she presented with palpitations and had been very fatigued. At the time she was also referred for a PSG.  Rate control was achieved with atenolol .She used aspirin.  She had not had symptoms of atrial fibrillation for well over 6 years until mid- July 2019 when she started to have palpitation onset and was admitted to [facility] in [place], [place].  Her heart rate is now controlled, she remained 30 days on anticoagulation on Eliquis. She has discontinued CPAP use a while back because she did not feel any better using it.  Her initial sleep study cannot be reviewed here today but she had a repeat sleep study in May 20, 2016, while living in the [place].   The sleep study showed an overall AHI of only 6 /hour, total desaturation time was 3.4% of the total night, average oxygen saturation was 94.5%.  And she snored loudly over 30 dB for almost 40% of the night.  The findings are consistent with a mild non-positional obstructive sleep apnea. So the interpreting physician stated that the patient should be educated on sleep hygiene, maintain ideal body weight and follow up with the referring physician. He recommended auto CPAP between 5 and 15 cmH2O pressure or alternatively a mandibular advancement splint. The patient actually received a new CPAP and she has hated it ever since. She wants to discuss alternatives to CPAP treatment. She considers herself claustrophobic.  Patient has been told that she is difficult to intubate, has a small airway, small trachea.   The patient endorsed the Epworth  Sleepiness Scale at 2 points.   The patient's weight 214 pounds with a height of 65 (inches), resulting in a BMI of 35.6 kg/m2. The patient's neck circumference measured 15 inches.  CURRENT MEDICATIONS: Aspirin, Tenormin, Tranxene, Ambien   PROCEDURE:  This is a multichannel digital polysomnogram utilizing the Somnostar 11.2 system.  Electrodes and sensors were applied and monitored per AASM Specifications.   EEG, EOG, Chin and Limb EMG, were sampled at 200 Hz.  ECG, Snore and Nasal Pressure, Thermal Airflow, Respiratory Effort, CPAP Flow and Pressure, Oximetry was sampled at 50 Hz. Digital video and audio were recorded.      BASELINE STUDY: Lights Out was at 22:35 and Lights On at 05:00.  Total recording time (TRT) was 386 minutes, with a total sleep time (TST) of 283 minutes.   The patient's sleep latency was 39.5 minutes.  REM latency was 110 minutes.  The sleep efficiency was 73.3 %.     SLEEP ARCHITECTURE: WASO (Wake after sleep onset) was 80.5 minutes.  There were 81.5 minutes in Stage N1, 75.5 minutes Stage N2, 78 minutes Stage N3 and 48 minutes in Stage REM.  The percentage of Stage N1 was 28.8%, Stage N2 was 26.7%, Stage N3 was 27.6% and Stage R (REM sleep) was 17.%.     RESPIRATORY ANALYSIS:  There were a total of 38 respiratory events:  0 obstructive apneas, 0 central apneas and 3 mixed apneas with a total of 3 apneas and an apnea index (AI)  of .6 /hour. There were 35 hypopneas with a hypopnea index of 7.4 /hour. The patient also had 42 respiratory event related arousals (RERAs).  The total APNEA/HYPOPNEA INDEX (AHI) was 8.1 /hour and the total RESPIRATORY DISTURBANCE INDEX was 17.0 /hour.  23 events occurred in REM sleep and 30 events in NREM. The REM AHI was 0. 28.8 /hour, versus a non-REM AHI of 3.8. The patient spent 140 minutes of total sleep time in the supine position and 143 minutes in non-supine. The supine AHI was 9.4 versus a non-supine AHI of 6.8.  OXYGEN SATURATION & C02:   The Wake baseline 02 saturation was 96%, with the lowest being 81%. Time spent below 89% saturation equaled 12 minutes. Desaturation clustered in REM sleep.    PERIODIC LIMB MOVEMENTS:  The patient had a total of 110 Periodic Limb Movements.  The Periodic Limb Movement (PLM) index was 23.3 and the PLM Arousal index was 15.7/hour. The arousals were noted as: 50 were spontaneous, 74 were associated with PLMs, and 56 were associated with respiratory events.  Audio and video analysis did not show any abnormal or unusual movements, behaviors, phonations or vocalizations.   EKG was in keeping with normal sinus rhythm (NSR). Post-study, the patient indicated that sleep was the same as usual.    IMPRESSION:  1. Mild Obstructive Sleep Apnea (OSA) at AHI 8.1/h. with loud snoring at RDI 17/h.  2. Severe Periodic Limb Movement Disorder (PLMD). 3. Primary Snoring  RECOMMENDATIONS:  1. Advise CPAP titration. 2. Evaluation for PLMs- RLS overlap, neuropathy or spinal stenosis to be considered.   I certify that I have reviewed the entire raw data recording prior to the issuance of this report in accordance with the Standards of Accreditation of the American Academy of Sleep Medicine (AASM)   Melvyn Novas, MD    11-21-2017  Diplomat, American Board of Psychiatry and Neurology  Diplomat, American Board of Sleep Medicine Medical Director, [facility] [employer]

## 2017-11-21 NOTE — Addendum Note (Signed)
Addended by: Melvyn Novas on: 11/21/2017 05:33 PM   Modules accepted: Orders

## 2017-11-22 ENCOUNTER — Inpatient Hospital Stay: Admission: RE | Admit: 2017-11-22 | Payer: BLUE CROSS/BLUE SHIELD | Source: Ambulatory Visit

## 2017-11-22 ENCOUNTER — Telehealth: Payer: Self-pay | Admitting: Neurology

## 2017-11-22 NOTE — Telephone Encounter (Signed)
She had no atrial fib during the test night, but usually an atrial  fib patient is better served with CPAP. By the type and degree of her apnea, she can go to a dental device, and weight loss will help, too.  Melvyn Novas, MD

## 2017-11-22 NOTE — Telephone Encounter (Signed)
-----   Message from Melvyn Novas, MD sent at 11/21/2017  5:33 PM EDT ----- Not in atrial fib during this sleep study- mild apnea, loud snoring, severe PLMs.   IMPRESSION:  1. Mild Obstructive Sleep Apnea (OSA) at AHI 8.1/h. with loud  snoring at RDI 17/h.  2. Severe Periodic Limb Movement Disorder (PLMD). 3. Primary Snoring  RECOMMENDATIONS:  1. Advise CPAP titration. 2. Evaluation for PLMs- RLS overlap, neuropathy or spinal  stenosis to be considered.

## 2017-11-22 NOTE — Telephone Encounter (Signed)
Called the patient back to advise her of Dr Dohmeier's statement. No answer. LVM for the patient to call back to discuss.

## 2017-11-22 NOTE — Telephone Encounter (Signed)
Called the patient and reviewed her sleep study with her. Advised the patient the findings in the study. Informed her that Dr Vickey Huger would recommend CPAP for treatment and would like for her to come back in for a CPAP titration test. The patient became upset and would like to know if Dr Vickey Huger would have any other alternatives then using the CPAP. Patient is not wanting to pursue CPAP as treatment unless that is her only resort. Advised the patient that I would let Dr Vickey Huger know her concern and see if she thinks she would be a ca nidate for any other treatment options. Patient verbalized understanding and was appreciative.

## 2017-11-29 ENCOUNTER — Other Ambulatory Visit: Payer: Self-pay

## 2017-11-29 ENCOUNTER — Encounter
Admission: RE | Admit: 2017-11-29 | Discharge: 2017-11-29 | Disposition: A | Discharge status: Home / Self Care | Payer: BLUE CROSS/BLUE SHIELD | Source: Ambulatory Visit | Attending: Orthopedic Surgery | Admitting: Orthopedic Surgery

## 2017-11-29 DIAGNOSIS — Z01818 Encounter for other preprocedural examination: Secondary | ICD-10-CM | POA: Insufficient documentation

## 2017-11-29 HISTORY — DX: Cardiac arrhythmia, unspecified: I49.9

## 2017-11-29 NOTE — Patient Instructions (Signed)
Your procedure is scheduled on: Friday 12/02/17 Report to Day Surgery. To find out your arrival time please call [phone] between 1PM - 3PM on Thurs. 12/01/17.  Remember: Instructions that are not followed completely may result in serious medical risk,  up to and including death, or upon the discretion of your surgeon and anesthesiologist your  surgery may need to be rescheduled.     _X__ 1. Do not eat food after midnight the night before your procedure.                 No gum chewing or hard candies. You may drink clear liquids up to 2 hours                 before you are scheduled to arrive for your surgery- DO not drink clear                 liquids within 2 hours of the start of your surgery.                 Clear Liquids include:  water, apple juice without pulp, clear carbohydrate                 drink such as Clearfast of Gatorade, Black Coffee or Tea (Do not add                 anything to coffee or tea).  __X__2.  On the morning of surgery brush your teeth with toothpaste and water, you                may rinse your mouth with mouthwash if you wish.  Do not swallow any toothpaste of mouthwash.     _X__ 3.  No Alcohol for 24 hours before or after surgery.   __ 4.  Do Not Smoke or use e-cigarettes For 24 Hours Prior to Your Surgery.                 Do not use any chewable tobacco products for at least 6 hours prior to                 surgery.  ___  5.  Bring all medications with you on the day of surgery if instructed.   __x__  6.  Notify your doctor if there is any change in your medical condition      (cold, fever, infections).     Do not wear jewelry, make-up, hairpins, clips or nail polish. Do not wear lotions, powders, or perfumes. You may wear deodorant. Do not shave 48 hours prior to surgery. Men may shave face and neck. Do not bring valuables to the hospital.    [facility] is not responsible for any belongings or valuables.  Contacts,  dentures or bridgework may not be worn into surgery. Leave your suitcase in the car. After surgery it may be brought to your room. For patients admitted to the hospital, discharge time is determined by your treatment team.   Patients discharged the day of surgery will not be allowed to drive home.   Please read over the following fact sheets that you were given:     __x__ Take these medicines the morning of surgery with A SIP OF WATER:    1. ALPRAZolam (XANAX) 0.5 MG tablet if needed  2.   3.   4.  5.  6.  ____ Fleet Enema (as directed)   __x__ Use CHG Soap as directed  ____ Use inhalers on the day of surgery  ____ Stop metformin 2 days prior to surgery    ____ Take 1/2 of usual insulin dose the night before surgery. No insulin the morning          of surgery.   __x__ Stop aspirin today  __x__ Stop Anti-inflammatories today.  May take Tylenol if needed   ____ Stop supplements until after surgery.    ____ Bring C-Pap to the hospital.

## 2017-12-01 MED ORDER — CEFAZOLIN SODIUM-DEXTROSE 2-4 GM/100ML-% IV SOLN
2.0000 g | Freq: Once | INTRAVENOUS | Status: AC
  Administered 2017-12-02: 2 g via INTRAVENOUS

## 2017-12-02 ENCOUNTER — Encounter
Admission: RE | Disposition: A | Discharge status: Home / Self Care | Payer: Self-pay | Source: Ambulatory Visit | Attending: Orthopedic Surgery

## 2017-12-02 ENCOUNTER — Other Ambulatory Visit: Payer: Self-pay

## 2017-12-02 ENCOUNTER — Ambulatory Visit: Payer: BLUE CROSS/BLUE SHIELD

## 2017-12-02 ENCOUNTER — Ambulatory Visit
Admission: RE | Admit: 2017-12-02 | Discharge: 2017-12-02 | Disposition: A | Discharge status: Home / Self Care | Payer: BLUE CROSS/BLUE SHIELD | Source: Ambulatory Visit | Attending: Orthopedic Surgery | Admitting: Orthopedic Surgery

## 2017-12-02 DIAGNOSIS — G4733 Obstructive sleep apnea (adult) (pediatric): Secondary | ICD-10-CM | POA: Diagnosis not present

## 2017-12-02 DIAGNOSIS — Z7982 Long term (current) use of aspirin: Secondary | ICD-10-CM | POA: Insufficient documentation

## 2017-12-02 DIAGNOSIS — M2342 Loose body in knee, left knee: Secondary | ICD-10-CM | POA: Insufficient documentation

## 2017-12-02 DIAGNOSIS — Z79899 Other long term (current) drug therapy: Secondary | ICD-10-CM | POA: Diagnosis not present

## 2017-12-02 DIAGNOSIS — X58XXXA Exposure to other specified factors, initial encounter: Secondary | ICD-10-CM | POA: Diagnosis not present

## 2017-12-02 DIAGNOSIS — S83232A Complex tear of medial meniscus, current injury, left knee, initial encounter: Secondary | ICD-10-CM | POA: Diagnosis not present

## 2017-12-02 DIAGNOSIS — I480 Paroxysmal atrial fibrillation: Secondary | ICD-10-CM | POA: Insufficient documentation

## 2017-12-02 HISTORY — PX: KNEE ARTHROSCOPY: SHX127

## 2017-12-02 SURGERY — ARTHROSCOPY, KNEE
Anesthesia: General | Site: Knee | Laterality: Left

## 2017-12-02 MED ORDER — PHENYLEPHRINE HCL 10 MG/ML IJ SOLN
INTRAMUSCULAR | Status: DC | PRN
  Administered 2017-12-02: 100 ug via INTRAVENOUS
  Administered 2017-12-02: 50 ug via INTRAVENOUS
  Administered 2017-12-02 (×2): 100 ug via INTRAVENOUS

## 2017-12-02 MED ORDER — OXYCODONE HCL 5 MG/5ML PO SOLN: 5.0000 mg | Freq: Once | ORAL | Status: AC | PRN

## 2017-12-02 MED ORDER — FAMOTIDINE 20 MG PO TABS
20.0000 mg | ORAL_TABLET | Freq: Once | ORAL | Status: AC
  Administered 2017-12-02: 20 mg via ORAL

## 2017-12-02 MED ORDER — IBUPROFEN 800 MG PO TABS: 800.0000 mg | ORAL_TABLET | Freq: Three times a day (TID) | ORAL | 0 refills | Status: AC

## 2017-12-02 MED ORDER — DEXMEDETOMIDINE HCL 200 MCG/2ML IV SOLN
INTRAVENOUS | Status: DC | PRN
  Administered 2017-12-02 (×3): 4 ug via INTRAVENOUS
  Administered 2017-12-02: 8 ug via INTRAVENOUS

## 2017-12-02 MED ORDER — SUGAMMADEX SODIUM 200 MG/2ML IV SOLN
INTRAVENOUS | Status: DC | PRN
  Administered 2017-12-02: 194.2 mg via INTRAVENOUS

## 2017-12-02 MED ORDER — OXYCODONE HCL 5 MG PO TABS
5.0000 mg | ORAL_TABLET | Freq: Once | ORAL | Status: AC | PRN
  Administered 2017-12-02: 5 mg via ORAL

## 2017-12-02 MED ORDER — OXYCODONE HCL 5 MG PO TABS
ORAL_TABLET | ORAL | Status: AC
  Filled 2017-12-02: qty 1

## 2017-12-02 MED ORDER — ROCURONIUM BROMIDE 100 MG/10ML IV SOLN
INTRAVENOUS | Status: DC | PRN
  Administered 2017-12-02: 50 mg via INTRAVENOUS

## 2017-12-02 MED ORDER — LIDOCAINE HCL (CARDIAC) PF 100 MG/5ML IV SOSY
PREFILLED_SYRINGE | INTRAVENOUS | Status: DC | PRN
  Administered 2017-12-02: 40 mg via INTRAVENOUS
  Administered 2017-12-02: 100 mg via INTRAVENOUS

## 2017-12-02 MED ORDER — PROMETHAZINE HCL 25 MG/ML IJ SOLN: 6.2500 mg | INTRAMUSCULAR | Status: DC | PRN

## 2017-12-02 MED ORDER — FENTANYL CITRATE (PF) 100 MCG/2ML IJ SOLN
INTRAMUSCULAR | Status: AC
  Filled 2017-12-02: qty 2

## 2017-12-02 MED ORDER — LIDOCAINE-EPINEPHRINE 1 %-1:100000 IJ SOLN
INTRAMUSCULAR | Status: DC | PRN
  Administered 2017-12-02: 5 mL
  Administered 2017-12-02: 2 mL

## 2017-12-02 MED ORDER — GLYCOPYRROLATE 0.2 MG/ML IJ SOLN
INTRAMUSCULAR | Status: DC | PRN
  Administered 2017-12-02: 0.2 mg via INTRAVENOUS

## 2017-12-02 MED ORDER — PROPOFOL 10 MG/ML IV BOLUS
INTRAVENOUS | Status: DC | PRN
  Administered 2017-12-02: 200 mg via INTRAVENOUS

## 2017-12-02 MED ORDER — DEXMEDETOMIDINE HCL IN NACL 80 MCG/20ML IV SOLN
INTRAVENOUS | Status: AC
  Filled 2017-12-02: qty 20

## 2017-12-02 MED ORDER — ASPIRIN EC 325 MG PO TBEC: 325.0000 mg | DELAYED_RELEASE_TABLET | Freq: Every day | ORAL | 0 refills | Status: AC

## 2017-12-02 MED ORDER — DEXAMETHASONE SODIUM PHOSPHATE 10 MG/ML IJ SOLN
INTRAMUSCULAR | Status: AC
  Filled 2017-12-02: qty 1

## 2017-12-02 MED ORDER — MIDAZOLAM HCL 2 MG/2ML IJ SOLN
INTRAMUSCULAR | Status: DC | PRN
  Administered 2017-12-02: 2 mg via INTRAVENOUS

## 2017-12-02 MED ORDER — GLYCOPYRROLATE 0.2 MG/ML IJ SOLN
INTRAMUSCULAR | Status: AC
  Filled 2017-12-02: qty 1

## 2017-12-02 MED ORDER — BUPIVACAINE HCL (PF) 0.5 % IJ SOLN
INTRAMUSCULAR | Status: DC | PRN
  Administered 2017-12-02: 2 mL
  Administered 2017-12-02: 5 mL

## 2017-12-02 MED ORDER — FENTANYL CITRATE (PF) 100 MCG/2ML IJ SOLN
INTRAMUSCULAR | Status: AC
  Administered 2017-12-02: 25 ug via INTRAVENOUS
  Filled 2017-12-02: qty 2

## 2017-12-02 MED ORDER — LIDOCAINE HCL (PF) 2 % IJ SOLN
INTRAMUSCULAR | Status: AC
  Filled 2017-12-02: qty 10

## 2017-12-02 MED ORDER — FAMOTIDINE 20 MG PO TABS
ORAL_TABLET | ORAL | Status: AC
  Filled 2017-12-02: qty 1

## 2017-12-02 MED ORDER — EPHEDRINE SULFATE 50 MG/ML IJ SOLN
INTRAMUSCULAR | Status: AC
  Filled 2017-12-02: qty 1

## 2017-12-02 MED ORDER — LACTATED RINGERS IV SOLN
INTRAVENOUS | Status: DC | PRN
  Administered 2017-12-02: 4 mL

## 2017-12-02 MED ORDER — MEPERIDINE HCL 50 MG/ML IJ SOLN: 6.2500 mg | INTRAMUSCULAR | Status: DC | PRN

## 2017-12-02 MED ORDER — ACETAMINOPHEN 500 MG PO TABS: 1000.0000 mg | ORAL_TABLET | Freq: Three times a day (TID) | ORAL | 2 refills | Status: AC

## 2017-12-02 MED ORDER — CEFAZOLIN SODIUM-DEXTROSE 2-4 GM/100ML-% IV SOLN
INTRAVENOUS | Status: AC
  Filled 2017-12-02: qty 100

## 2017-12-02 MED ORDER — ONDANSETRON 4 MG PO TBDP: 4.0000 mg | ORAL_TABLET | Freq: Three times a day (TID) | ORAL | 0 refills | Status: DC | PRN

## 2017-12-02 MED ORDER — FENTANYL CITRATE (PF) 100 MCG/2ML IJ SOLN
INTRAMUSCULAR | Status: DC | PRN
  Administered 2017-12-02: 25 ug via INTRAVENOUS
  Administered 2017-12-02 (×2): 50 ug via INTRAVENOUS

## 2017-12-02 MED ORDER — HYDROCODONE-ACETAMINOPHEN 5-325 MG PO TABS: 1.0000 | ORAL_TABLET | ORAL | 0 refills | Status: DC | PRN

## 2017-12-02 MED ORDER — PROPOFOL 10 MG/ML IV BOLUS
INTRAVENOUS | Status: AC
  Filled 2017-12-02: qty 20

## 2017-12-02 MED ORDER — ONDANSETRON HCL 4 MG/2ML IJ SOLN
INTRAMUSCULAR | Status: AC
  Filled 2017-12-02: qty 2

## 2017-12-02 MED ORDER — FENTANYL CITRATE (PF) 100 MCG/2ML IJ SOLN
25.0000 ug | INTRAMUSCULAR | Status: DC | PRN
  Administered 2017-12-02 (×4): 25 ug via INTRAVENOUS

## 2017-12-02 MED ORDER — EPHEDRINE SULFATE 50 MG/ML IJ SOLN
INTRAMUSCULAR | Status: DC | PRN
  Administered 2017-12-02: 5 mg via INTRAVENOUS
  Administered 2017-12-02 (×2): 10 mg via INTRAVENOUS

## 2017-12-02 MED ORDER — ONDANSETRON HCL 4 MG/2ML IJ SOLN
INTRAMUSCULAR | Status: DC | PRN
  Administered 2017-12-02: 4 mg via INTRAVENOUS

## 2017-12-02 MED ORDER — LACTATED RINGERS IV SOLN
INTRAVENOUS | Status: DC
  Administered 2017-12-02: 10:00:00 via INTRAVENOUS

## 2017-12-02 MED ORDER — MIDAZOLAM HCL 2 MG/2ML IJ SOLN
INTRAMUSCULAR | Status: AC
  Filled 2017-12-02: qty 2

## 2017-12-02 MED ORDER — DEXAMETHASONE SODIUM PHOSPHATE 10 MG/ML IJ SOLN
INTRAMUSCULAR | Status: DC | PRN
  Administered 2017-12-02: 10 mg via INTRAVENOUS

## 2017-12-02 SURGICAL SUPPLY — 55 items
ADAPTER IRRIG TUBE 2 SPIKE SOL (ADAPTER) ×4 IMPLANT
Arthroscopic energy 90 probe with suction ×2 IMPLANT
BANDAGE ACE 6X5 VEL STRL LF (GAUZE/BANDAGES/DRESSINGS) ×2 IMPLANT
BLADE SURG SZ11 CARB STEEL (BLADE) ×2 IMPLANT
BNDG COHESIVE 6X5 TAN STRL LF (GAUZE/BANDAGES/DRESSINGS) ×2 IMPLANT
BNDG ESMARK 6X12 TAN STRL LF (GAUZE/BANDAGES/DRESSINGS) ×2 IMPLANT
BUR RADIUS 3.5 (BURR) IMPLANT
BUR RADIUS 4.0X18.5 (BURR) IMPLANT
CAST PADDING 6X4YD ST 30248 (SOFTGOODS) ×1
CHLORAPREP W/TINT 26ML (MISCELLANEOUS) ×2 IMPLANT
COOLER POLAR GLACIER W/PUMP (MISCELLANEOUS) ×2 IMPLANT
COVER WAND RF STERILE (DRAPES) ×2 IMPLANT
CUFF TOURN 24 STER (MISCELLANEOUS) IMPLANT
CUFF TOURN 30 STER DUAL PORT (MISCELLANEOUS) ×2 IMPLANT
DEVICE SUCT BLK HOLE OR FLOOR (MISCELLANEOUS) ×2 IMPLANT
DRAPE IMP U-DRAPE 54X76 (DRAPES) ×2 IMPLANT
DRAPE LEGGINS SURG 28X43 STRL (DRAPES) IMPLANT
ELECT REM PT RETURN 9FT ADLT (ELECTROSURGICAL)
ELECTRODE REM PT RTRN 9FT ADLT (ELECTROSURGICAL) IMPLANT
FRR STEALTH 3.5MM ×2 IMPLANT
GAUZE SPONGE 4X4 12PLY STRL (GAUZE/BANDAGES/DRESSINGS) ×2 IMPLANT
GLOVE BIOGEL PI IND STRL 8 (GLOVE) ×1 IMPLANT
GLOVE BIOGEL PI INDICATOR 8 (GLOVE) ×1
GLOVE SURG ORTHO 8.0 STRL STRW (GLOVE) ×4 IMPLANT
GOWN STRL REUS W/ TWL LRG LVL3 (GOWN DISPOSABLE) ×1 IMPLANT
GOWN STRL REUS W/ TWL XL LVL3 (GOWN DISPOSABLE) ×1 IMPLANT
GOWN STRL REUS W/TWL LRG LVL3 (GOWN DISPOSABLE) ×1
GOWN STRL REUS W/TWL XL LVL3 (GOWN DISPOSABLE) ×1
IV LACTATED RINGER IRRG 3000ML (IV SOLUTION) ×4
IV LR IRRIG 3000ML ARTHROMATIC (IV SOLUTION) ×4 IMPLANT
KIT TURNOVER KIT A (KITS) ×2 IMPLANT
MANIFOLD NEPTUNE II (INSTRUMENTS) ×2 IMPLANT
MAT ABSORB  FLUID 56X50 GRAY (MISCELLANEOUS) ×1
MAT ABSORB FLUID 56X50 GRAY (MISCELLANEOUS) ×1 IMPLANT
NDL MAYO CATGUT SZ5 (NEEDLE)
NDL SUT 5 .5 CRC TPR PNT MAYO (NEEDLE) IMPLANT
NEEDLE HYPO 22GX1.5 SAFETY (NEEDLE) ×2 IMPLANT
PACK ARTHROSCOPY KNEE (MISCELLANEOUS) ×2 IMPLANT
PAD ABD DERMACEA PRESS 5X9 (GAUZE/BANDAGES/DRESSINGS) ×4 IMPLANT
PAD WRAPON POLAR KNEE (MISCELLANEOUS) ×1 IMPLANT
PADDING CAST COTTON 6X4 ST (SOFTGOODS) ×1 IMPLANT
PENCIL ELECTRO HAND CTR (MISCELLANEOUS) IMPLANT
SET TUBE SUCT SHAVER OUTFL 24K (TUBING) ×2 IMPLANT
SET TUBE TIP INTRA-ARTICULAR (MISCELLANEOUS) ×2 IMPLANT
STRIP CLOSURE SKIN 1/2X4 (GAUZE/BANDAGES/DRESSINGS) IMPLANT
SUT ETHILON 3-0 FS-10 30 BLK (SUTURE) ×2
SUT MNCRL AB 4-0 PS2 18 (SUTURE) IMPLANT
SUT VIC AB 0 CT2 27 (SUTURE) IMPLANT
SUT VIC AB 2-0 CT2 27 (SUTURE) IMPLANT
SUTURE EHLN 3-0 FS-10 30 BLK (SUTURE) ×1 IMPLANT
TOWEL OR 17X26 4PK STRL BLUE (TOWEL DISPOSABLE) ×4 IMPLANT
TUBING ARTHRO INFLOW-ONLY STRL (TUBING) ×2 IMPLANT
WAND HAND CNTRL MULTIVAC 50 (MISCELLANEOUS) IMPLANT
WAND HAND CNTRL MULTIVAC 90 (MISCELLANEOUS) IMPLANT
WRAPON POLAR PAD KNEE (MISCELLANEOUS) ×2

## 2017-12-02 NOTE — OR Nursing (Signed)
Discharge instructions discussed with pt and friend. Both voice understanding. 

## 2017-12-02 NOTE — Anesthesia Postprocedure Evaluation (Signed)
Anesthesia Post Note  Patient: Tracey Lin  Procedure(s) Performed: ARTHROSCOPY KNEE WITH PARTIAL MEDIAL MENISECTOMY, REMOVAL OF LOOSE BODY (Left Knee)  Patient location during evaluation: PACU Anesthesia Type: General Level of consciousness: awake and alert and oriented Pain management: pain level controlled Vital Signs Assessment: post-procedure vital signs reviewed and stable Respiratory status: spontaneous breathing, nonlabored ventilation and respiratory function stable Cardiovascular status: blood pressure returned to baseline and stable Postop Assessment: no signs of nausea or vomiting Anesthetic complications: no     Last Vitals:  Vitals:   12/02/17 1254 12/02/17 1307  BP: 123/69 134/70  Pulse: (!) 54   Resp: 16 16  Temp:    SpO2: 100%     Last Pain:  Vitals:   12/02/17 1328  TempSrc:   PainSc: 7                  Bladimir Auman

## 2017-12-02 NOTE — Anesthesia Preprocedure Evaluation (Signed)
Anesthesia Evaluation  Patient identified by MRN, date of birth, ID band Patient awake    Reviewed: Allergy & Precautions, NPO status , Patient's Chart, lab work & pertinent test results  History of Anesthesia Complications (+) DIFFICULT AIRWAY and history of anesthetic complications (pt states she has a narrow trachea)  Airway Mallampati: III  TM Distance: >3 FB Neck ROM: Full    Dental no notable dental hx.    Pulmonary sleep apnea , neg COPD,    breath sounds clear to auscultation- rhonchi (-) wheezing      Cardiovascular Exercise Tolerance: Good (-) hypertension(-) CAD, (-) Past MI, (-) Cardiac Stents and (-) CABG + dysrhythmias (PAF)  Rhythm:Regular Rate:Normal - Systolic murmurs and - Diastolic murmurs    Neuro/Psych Anxiety negative neurological ROS     GI/Hepatic Neg liver ROS, GERD  ,  Endo/Other  negative endocrine ROSneg diabetes  Renal/GU negative Renal ROS     Musculoskeletal negative musculoskeletal ROS (+)   Abdominal (+) + obese,   Peds  Hematology  (+) anemia ,   Anesthesia Other Findings Past Medical History: No date: Anxiety No date: Difficult intubation     Comment:  "SMALL TRACHEA" No date: Dysrhythmia No date: GERD (gastroesophageal reflux disease) No date: History of kidney stones     Comment:  H/O No date: OSA (obstructive sleep apnea)     Comment:  USES CPAP ( rarely) No date: Paroxysmal atrial fibrillation (HCC)   Reproductive/Obstetrics                             Anesthesia Physical Anesthesia Plan  ASA: II  Anesthesia Plan: General   Post-op Pain Management:    Induction: Intravenous  PONV Risk Score and Plan: 2 and Dexamethasone, Ondansetron and Midazolam  Airway Management Planned: Oral ETT  Additional Equipment:   Intra-op Plan:   Post-operative Plan: Extubation in OR  Informed Consent: I have reviewed the patients History and  Physical, chart, labs and discussed the procedure including the risks, benefits and alternatives for the proposed anesthesia with the patient or authorized representative who has indicated his/her understanding and acceptance.   Dental advisory given  Plan Discussed with: CRNA and Anesthesiologist  Anesthesia Plan Comments:         Anesthesia Quick Evaluation

## 2017-12-02 NOTE — H&P (Signed)
Paper H&P to be scanned into permanent record. H&P reviewed. No significant changes noted.  

## 2017-12-02 NOTE — Transfer of Care (Signed)
Immediate Anesthesia Transfer of Care Note  Patient: Tracey Lin  Procedure(s) Performed: ARTHROSCOPY KNEE WITH PARTIAL MEDIAL MENISECTOMY, REMOVAL OF LOOSE BODY (Left Knee)  Patient Location: PACU  Anesthesia Type:General  Level of Consciousness: awake  Airway & Oxygen Therapy: Patient Spontanous Breathing  Post-op Assessment: Report given to RN  Post vital signs: Reviewed and stable  Last Vitals:  Vitals Value Taken Time  BP    Temp    Pulse 68 12/02/2017 11:50 AM  Resp 14 12/02/2017 11:50 AM  SpO2 100 % 12/02/2017 11:50 AM  Vitals shown include unvalidated device data.  Last Pain:  Vitals:   12/02/17 0937  TempSrc: Temporal  PainSc: 0-No pain         Complications: No apparent anesthesia complications

## 2017-12-02 NOTE — Anesthesia Post-op Follow-up Note (Signed)
Anesthesia QCDR form completed.        

## 2017-12-02 NOTE — Op Note (Signed)
Operative Note    SURGERY DATE: 12/02/2017   PRE-OP DIAGNOSIS:  1. Left medial meniscus tear 2. Left patella degenerative changes   POST-OP DIAGNOSIS:  1. Left medial meniscus tear 2. Left patella degenerative changes 3. Left knee loose body   PROCEDURES:  1. Left knee arthroscopy, partial medial meniscectomy 2. Left knee chondroplasty of patellofemoral compartment 3. Left knee removal of loose body from lateral compartment   SURGEON: Rosealee Albee, MD   ANESTHESIA: Gen   ESTIMATED BLOOD LOSS: minimal   TOTAL IV FLUIDS: per anesthesia   INDICATION(S):  Tracey Lin is a 57 y.o. female with signs and symptoms as well as MRI finding of medial meniscus tear.  She underwent a trial of nonoperative management with corticosteroid injection, physical therapy, and activity modifications with only temporary relief of her symptoms.  After discussion of risks, benefits, and alternatives to surgery, the patient elected to proceed.   OPERATIVE FINDINGS:    Examination under anesthesia: A careful examination under anesthesia was performed.  Passive range of motion was: Hyperextension: 2.  Extension: 0.  Flexion: 120.  Lachman: normal. Pivot Shift: normal.  Posterior drawer: normal.  Varus stability in full extension: normal.  Varus stability in 30 degrees of flexion: normal.  Valgus stability in full extension: normal.  Valgus stability in 30 degrees of flexion: normal.   Intra-operative findings: A thorough arthroscopic examination of the knee was performed.  The findings are: 1. Suprapatellar pouch: Normal 2. Undersurface of median ridge: Normal 3. Medial patellar facet: Grade 2 degenerative changes with focal area approximately 12 mm x 4 mm of grade 4 changes 4. Lateral patellar facet: Grade 2 degenerative changes 5. Trochlea: Grade 2 degenerative changes 6. Lateral gutter/popliteus tendon: Normal 7. Hoffa's fat pad: Inflamed 8. Medial gutter/plica: Normal 9. ACL: Normal 10. PCL:  Normal 11. Medial meniscus: Radial tear of the posterior horn affecting approximately 30% of the meniscus with further horizontal tear of the posterior horn posterior to the radial tear region 12. Medial compartment cartilage: Grade 1 degenerative changes the tibial plateau and medial femoral condyle 13. Lateral meniscus: Normal 14. Lateral compartment cartilage: Grade 1 degenerative changes of the tibial plateau.  There was a large loose body measuring approximately 12 mm x 4 mm in the lateral compartment.   OPERATIVE REPORT:     I identified Tracey Lin in the pre-operative holding area. I marked the operative knee with my initials. I reviewed the risks and benefits of the proposed surgical intervention and the patient (and/or patient's guardian) wished to proceed. The patient was transferred to the operative suite and placed in the supine position with all bony prominences padded.  Anesthesia was administered. Appropriate IV antibiotics were administered prior to incision. The extremity was then prepped and draped in standard fashion. A time out was performed confirming the correct extremity, correct patient, and correct procedure.   Arthroscopy portals were marked. Local anesthetic was injected to the planned portal sites. The anterolateral portal was established with an 11 blade.      The arthroscope was placed in the anterolateral portal and then into the suprapatellar pouch.  A diagnostic knee scope was completed with the above findings. The medial meniscus tear was identified.   Next the medial portal was established under needle localization.  Using an arthroscopic shaver, the large loose body measuring approximately 12 mm in length was removed for the lateral compartment.  It appeared to be a cartilaginous fragment of similar sized to the grade 4  patellar defect.  The MCL was pie-crusted to improve visualization of the posterior horn. The meniscal tear was debrided using an arthroscopic  biter and an oscillating shaver until the meniscus had stable borders. A chondroplasty was performed of the patellofemoral compartment such that there were stable cartilage edges without any loose fragments of cartilage. Arthroscopic fluid was removed from the joint.   The portals were closed with 3-0 Nylon suture. Sterile dressings included Xeroform, 4x4s, Sof-Rol, and Bias wrap. A Polarcare was placed.  The patient was then awakened and taken to the PACU hemodynamically stable without complication.     POSTOPERATIVE PLAN: The patient will be discharged home today once they meet PACU criteria. Aspirin 325 mg daily was prescribed for 2 weeks for DVT prophylaxis.  Physical therapy will start on POD#3-4. Weight-bearing as tolerated. Follow up in 2 weeks per protocol.

## 2017-12-02 NOTE — Anesthesia Procedure Notes (Signed)
Procedure Name: Intubation Date/Time: 12/02/2017 10:34 AM Performed by: Foxworth, Richard Elbert Ewings, RN Pre-anesthesia Checklist: Patient identified, Emergency Drugs available, Suction available, Patient being monitored and Timeout performed Patient Re-evaluated:Patient Re-evaluated prior to induction Oxygen Delivery Method: Circle system utilized Preoxygenation: Pre-oxygenation with 100% oxygen Induction Type: IV induction Ventilation: Mask ventilation without difficulty Laryngoscope Size: McGraph and 3 Grade View: Grade I Tube type: Oral Tube size: 6.5 mm Number of attempts: 1 Airway Equipment and Method: Stylet Placement Confirmation: positive ETCO2 and breath sounds checked- equal and bilateral Secured at: 21 cm Tube secured with: Tape Dental Injury: Teeth and Oropharynx as per pre-operative assessment

## 2017-12-02 NOTE — Discharge Instructions (Signed)
Arthroscopic Knee Surgery - Partial Meniscectomy   Post-Op Instructions   1. Bracing or crutches: Crutches will be provided at the time of discharge from the surgery center if you do not already have them.   2. Ice: You may be provided with a device (Polar Care) that allows you to ice the affected area effectively. Otherwise you can ice manually.    3. Driving:  Plan on not driving for at least two weeks. Please note that you are advised NOT to drive while taking narcotic pain medications as you may be impaired and unsafe to drive.   4. Activity: Ankle pumps several times an hour while awake to prevent blood clots. Weight bearing: as tolerated. Use crutches for as needed (usually ~1 week or less) until pain allows you to ambulate without a limp. Bending and straightening the knee is unlimited. Elevate knee above heart level as much as possible for one week. Avoid standing more than 5 minutes (consecutively) for the first week.  Avoid long distance travel for 2 weeks.  5. Medications:  - You have been provided a prescription for narcotic pain medicine. After surgery, take 1-2 narcotic tablets every 4 hours if needed for severe pain.  - You may take up to 3000mg/day of tylenol (acetaminophen). You can take 1000mg 3x/day. Please check your narcotic. If you have acetaminophen in your narcotic (each tablet will be 325mg), be careful not to exceed a total of 3000mg/day of acetaminophen.  - A prescription for anti-nausea medication will be provided in case the narcotic medicine or anesthesia causes nausea - take 1 tablet every 6 hours only if nauseated.  - Take ibuprofen 800 mg every 8 hours WITH food to reduce post-operative knee swelling. DO NOT STOP IBUPROFEN POST-OP UNTIL INSTRUCTED TO DO SO at first post-op office visit (10-14 days after surgery). However, please discontinue if you have any abdominal discomfort after taking this.  - Take enteric coated aspirin 325 mg once daily for 2 weeks to prevent  blood clots.    6. Bandages: The physical therapist should change the bandages at the first post-op appointment. If needed, the dressing supplies have been provided to you.   7. Physical Therapy: 1-2 times per week for 6 weeks. Therapy typically starts on post operative Day 3 or 4. You have been provided an order for physical therapy. The therapist will provide home exercises.   8. Work: May return to full work usually around 2 weeks after 1st post-operative visit. May do light duty/desk job in approximately 1-2 weeks when off of narcotics, pain is well-controlled, and swelling has decreased. Labor intensive jobs may require 4-6 weeks to return.      9. Post-Op Appointments: Your first post-op appointment will be with Dr. Patel in approximately 2 weeks time.    If you find that they have not been scheduled please call the Orthopaedic Appointment front desk at [phone].  AMBULATORY SURGERY  DISCHARGE INSTRUCTIONS   1) The drugs that you were given will stay in your system until tomorrow so for the next 24 hours you should not:  A) Drive an automobile B) Make any legal decisions C) Drink any alcoholic beverage   2) You may resume regular meals tomorrow.  Today it is better to start with liquids and gradually work up to solid foods.  You may eat anything you prefer, but it is better to start with liquids, then soup and crackers, and gradually work up to solid foods.   3) Please notify your   doctor immediately if you have any unusual bleeding, trouble breathing, redness and pain at the surgery site, drainage, fever, or pain not relieved by medication.    4) Additional Instructions:        Please contact your physician with any problems or Same Day Surgery at [phone], Monday through Friday 6 am to 4 pm, or [place] [facility] [place] Main number at [phone].  

## 2017-12-05 ENCOUNTER — Encounter: Payer: Self-pay | Admitting: Orthopedic Surgery

## 2017-12-28 ENCOUNTER — Other Ambulatory Visit: Payer: Self-pay

## 2017-12-28 ENCOUNTER — Encounter
Admission: RE | Admit: 2017-12-28 | Discharge: 2017-12-28 | Disposition: A | Discharge status: Home / Self Care | Payer: BLUE CROSS/BLUE SHIELD | Source: Ambulatory Visit | Attending: Obstetrics and Gynecology | Admitting: Obstetrics and Gynecology

## 2017-12-28 NOTE — Patient Instructions (Signed)
Your procedure is scheduled on: 01-02-18 Report to Same Day Surgery 2nd floor medical mall [facility](Medical Mall Entrance-take elevator on left to 2nd floor.  Check in with surgery information desk.) To find out your arrival time please call [phone] between 1PM - 3PM on 12-30-17  Remember: Instructions that are not followed completely may result in serious medical risk, up to and including death, or upon the discretion of your surgeon and anesthesiologist your surgery may need to be rescheduled.    _x___ 1. Do not eat food after midnight the night before your procedure. You may drink clear liquids up to 2 hours before you are scheduled to arrive at the hospital for your procedure.  Do not drink clear liquids within 2 hours of your scheduled arrival to the hospital.  Clear liquids include  --Water or Apple juice without pulp  --Clear carbohydrate beverage such as ClearFast or Gatorade  --Black Coffee or Clear Tea (No milk, no creamers, do not add anything to the coffee or Tea   ____Ensure clear carbohydrate drink on the way to the hospital for bariatric patients  ____Ensure clear carbohydrate drink 3 hours before surgery for Dr Rutherford NailByrnett's patients if physician instructed.   No gum chewing or hard candies.     __x__ 2. No Alcohol for 24 hours before or after surgery.   __x__3. No Smoking or e-cigarettes for 24 prior to surgery.  Do not use any chewable tobacco products for at least 6 hour prior to surgery   ____  4. Bring all medications with you on the day of surgery if instructed.    __x__ 5. Notify your doctor if there is any change in your medical condition     (cold, fever, infections).    x___6. On the morning of surgery brush your teeth with toothpaste and water.  You may rinse your mouth with mouth wash if you wish.  Do not swallow any toothpaste or mouthwash.   Do not wear jewelry, make-up, hairpins, clips or nail polish.  Do not wear lotions, powders, or perfumes. You may wear  deodorant.  Do not shave 48 hours prior to surgery. Men may shave face and neck.  Do not bring valuables to the hospital.    [facility] is not responsible for any belongings or valuables.               Contacts, dentures or bridgework may not be worn into surgery.  Leave your suitcase in the car. After surgery it may be brought to your room.  For patients admitted to the hospital, discharge time is determined by your  treatment team.  _  Patients discharged the day of surgery will not be allowed to drive home.  You will need someone to drive you home and stay with you the night of your procedure.    Please read over the following fact sheets that you were given:   [facility] Preparing [facility]  _x___ TAKE THE FOLLOWING MEDICATION THE MORNING OF SURGERY WITH A SMALL SIP OF WATER. These include:  1. PRILOSEC  2. TAKE A PRILOSEC THE NIGHT BEFORE SURGERY  3. YOU MAY TAKE A XANAX AM OF SURGERY IF NEEDED  4.  5.  6.  ____Fleets enema or Magnesium Citrate as directed.   ____ Use CHG Soap or sage wipes as directed on instruction sheet   ____ Use inhalers on the day of surgery and bring to hospital day of surgery  ____ Stop Metformin and Janumet 2 days  prior to surgery.    ____ Take 1/2 of usual insulin dose the night before surgery and none on the morning     surgery.   _x___ Follow recommendations from Cardiologist, Pulmonologist or PCP regarding stopping Aspirin, Coumadin, Plavix ,Eliquis, Effient, or Pradaxa, and Pletal-CALL DR BEASLEYS OFFICE AND ASK ABOUT ASA  X____Stop Anti-inflammatories such as Advil, Aleve, Ibuprofen, Motrin, Naproxen, Naprosyn, Goodies powders or aspirin products NOW-OK to take Tylenol    ____ Stop supplements until after surgery.     ____ Bring C-Pap to the hospital.

## 2018-01-02 ENCOUNTER — Ambulatory Visit: Payer: BLUE CROSS/BLUE SHIELD | Admitting: Certified Registered Nurse Anesthetist

## 2018-01-02 ENCOUNTER — Other Ambulatory Visit: Payer: Self-pay

## 2018-01-02 ENCOUNTER — Ambulatory Visit
Admission: AC | Admit: 2018-01-02 | Discharge: 2018-01-02 | Disposition: A | Discharge status: Home / Self Care | Payer: BLUE CROSS/BLUE SHIELD | Source: Ambulatory Visit | Attending: Obstetrics and Gynecology | Admitting: Obstetrics and Gynecology

## 2018-01-02 ENCOUNTER — Encounter
Admission: AC | Disposition: A | Discharge status: Home / Self Care | Payer: Self-pay | Source: Ambulatory Visit | Attending: Obstetrics and Gynecology

## 2018-01-02 DIAGNOSIS — Z885 Allergy status to narcotic agent status: Secondary | ICD-10-CM | POA: Diagnosis not present

## 2018-01-02 DIAGNOSIS — Z7982 Long term (current) use of aspirin: Secondary | ICD-10-CM | POA: Diagnosis not present

## 2018-01-02 DIAGNOSIS — Z87442 Personal history of urinary calculi: Secondary | ICD-10-CM | POA: Insufficient documentation

## 2018-01-02 DIAGNOSIS — Z888 Allergy status to other drugs, medicaments and biological substances status: Secondary | ICD-10-CM | POA: Insufficient documentation

## 2018-01-02 DIAGNOSIS — I480 Paroxysmal atrial fibrillation: Secondary | ICD-10-CM | POA: Diagnosis not present

## 2018-01-02 DIAGNOSIS — F419 Anxiety disorder, unspecified: Secondary | ICD-10-CM | POA: Insufficient documentation

## 2018-01-02 DIAGNOSIS — G4733 Obstructive sleep apnea (adult) (pediatric): Secondary | ICD-10-CM | POA: Insufficient documentation

## 2018-01-02 DIAGNOSIS — N950 Postmenopausal bleeding: Secondary | ICD-10-CM | POA: Diagnosis not present

## 2018-01-02 DIAGNOSIS — Z79899 Other long term (current) drug therapy: Secondary | ICD-10-CM | POA: Diagnosis not present

## 2018-01-02 DIAGNOSIS — N840 Polyp of corpus uteri: Secondary | ICD-10-CM | POA: Diagnosis not present

## 2018-01-02 DIAGNOSIS — K219 Gastro-esophageal reflux disease without esophagitis: Secondary | ICD-10-CM | POA: Insufficient documentation

## 2018-01-02 HISTORY — PX: DILATATION & CURETTAGE/HYSTEROSCOPY WITH MYOSURE: SHX6511

## 2018-01-02 LAB — URINALYSIS, COMPLETE (UACMP) WITH MICROSCOPIC
BILIRUBIN URINE: NEGATIVE
Bacteria, UA: NONE SEEN
Glucose, UA: NEGATIVE mg/dL
Ketones, ur: NEGATIVE mg/dL
LEUKOCYTES UA: NEGATIVE
Nitrite: NEGATIVE
Protein, ur: NEGATIVE mg/dL
Specific Gravity, Urine: 1.023 (ref 1.005–1.030)
pH: 5 (ref 5.0–8.0)

## 2018-01-02 SURGERY — DILATATION & CURETTAGE/HYSTEROSCOPY WITH MYOSURE
Anesthesia: General

## 2018-01-02 MED ORDER — MEPERIDINE HCL 50 MG/ML IJ SOLN: 6.2500 mg | INTRAMUSCULAR | Status: DC | PRN

## 2018-01-02 MED ORDER — PROPOFOL 10 MG/ML IV BOLUS
INTRAVENOUS | Status: DC | PRN
  Administered 2018-01-02: 180 mg via INTRAVENOUS

## 2018-01-02 MED ORDER — LACTATED RINGERS IV SOLN
INTRAVENOUS | Status: DC
  Administered 2018-01-02: 11:00:00 via INTRAVENOUS

## 2018-01-02 MED ORDER — FENTANYL CITRATE (PF) 100 MCG/2ML IJ SOLN
INTRAMUSCULAR | Status: AC
  Filled 2018-01-02: qty 2

## 2018-01-02 MED ORDER — FAMOTIDINE 20 MG PO TABS
ORAL_TABLET | ORAL | Status: AC
  Filled 2018-01-02: qty 1

## 2018-01-02 MED ORDER — DEXAMETHASONE SODIUM PHOSPHATE 10 MG/ML IJ SOLN
INTRAMUSCULAR | Status: DC | PRN
  Administered 2018-01-02: 8 mg via INTRAVENOUS

## 2018-01-02 MED ORDER — PROMETHAZINE HCL 25 MG/ML IJ SOLN: 6.2500 mg | INTRAMUSCULAR | Status: DC | PRN

## 2018-01-02 MED ORDER — SODIUM CHLORIDE (PF) 0.9 % IJ SOLN
INTRAMUSCULAR | Status: AC
  Filled 2018-01-02: qty 10

## 2018-01-02 MED ORDER — OXYCODONE HCL 5 MG/5ML PO SOLN: 5.0000 mg | Freq: Once | ORAL | Status: DC | PRN

## 2018-01-02 MED ORDER — IBUPROFEN 800 MG PO TABS: 800.0000 mg | ORAL_TABLET | Freq: Three times a day (TID) | ORAL | 1 refills | Status: DC | PRN

## 2018-01-02 MED ORDER — EPHEDRINE SULFATE 50 MG/ML IJ SOLN
INTRAMUSCULAR | Status: AC
  Filled 2018-01-02: qty 1

## 2018-01-02 MED ORDER — LIDOCAINE HCL (PF) 2 % IJ SOLN
INTRAMUSCULAR | Status: AC
  Filled 2018-01-02: qty 10

## 2018-01-02 MED ORDER — FAMOTIDINE 20 MG PO TABS
20.0000 mg | ORAL_TABLET | Freq: Once | ORAL | Status: AC
  Administered 2018-01-02: 20 mg via ORAL

## 2018-01-02 MED ORDER — MIDAZOLAM HCL 2 MG/2ML IJ SOLN
INTRAMUSCULAR | Status: AC
  Filled 2018-01-02: qty 2

## 2018-01-02 MED ORDER — OXYCODONE HCL 5 MG PO TABS: 5.0000 mg | ORAL_TABLET | Freq: Once | ORAL | Status: DC | PRN

## 2018-01-02 MED ORDER — PROPOFOL 10 MG/ML IV BOLUS
INTRAVENOUS | Status: AC
  Filled 2018-01-02: qty 20

## 2018-01-02 MED ORDER — FENTANYL CITRATE (PF) 100 MCG/2ML IJ SOLN
25.0000 ug | INTRAMUSCULAR | Status: DC | PRN
  Administered 2018-01-02 (×2): 50 ug via INTRAVENOUS

## 2018-01-02 MED ORDER — MIDAZOLAM HCL 2 MG/2ML IJ SOLN
INTRAMUSCULAR | Status: DC | PRN
  Administered 2018-01-02: 2 mg via INTRAVENOUS

## 2018-01-02 MED ORDER — LIDOCAINE HCL (CARDIAC) PF 100 MG/5ML IV SOSY
PREFILLED_SYRINGE | INTRAVENOUS | Status: DC | PRN
  Administered 2018-01-02: 100 mg via INTRAVENOUS

## 2018-01-02 MED ORDER — ONDANSETRON HCL 4 MG/2ML IJ SOLN
INTRAMUSCULAR | Status: DC | PRN
  Administered 2018-01-02: 4 mg via INTRAVENOUS

## 2018-01-02 MED ORDER — EPHEDRINE SULFATE 50 MG/ML IJ SOLN
INTRAMUSCULAR | Status: DC | PRN
  Administered 2018-01-02: 10 mg via INTRAVENOUS

## 2018-01-02 MED ORDER — DEXAMETHASONE SODIUM PHOSPHATE 10 MG/ML IJ SOLN
INTRAMUSCULAR | Status: AC
  Filled 2018-01-02: qty 1

## 2018-01-02 MED ORDER — ONDANSETRON HCL 4 MG/2ML IJ SOLN
INTRAMUSCULAR | Status: AC
  Filled 2018-01-02: qty 2

## 2018-01-02 MED ORDER — FENTANYL CITRATE (PF) 100 MCG/2ML IJ SOLN
INTRAMUSCULAR | Status: DC | PRN
  Administered 2018-01-02 (×2): 25 ug via INTRAVENOUS

## 2018-01-02 SURGICAL SUPPLY — 16 items
CANISTER SUCT 3000ML PPV (MISCELLANEOUS) ×2 IMPLANT
CATH ROBINSON RED A/P 16FR (CATHETERS) ×2 IMPLANT
COVER WAND RF STERILE (DRAPES) ×2 IMPLANT
DEVICE MYOSURE LITE (MISCELLANEOUS) ×2 IMPLANT
GLOVE BIO SURGEON STRL SZ7 (GLOVE) ×2 IMPLANT
GLOVE INDICATOR 7.5 STRL GRN (GLOVE) ×2 IMPLANT
GOWN STRL REUS W/ TWL LRG LVL3 (GOWN DISPOSABLE) ×2 IMPLANT
GOWN STRL REUS W/TWL LRG LVL3 (GOWN DISPOSABLE) ×2
KIT PROCEDURE FLUENT (KITS) ×2 IMPLANT
KIT TURNOVER CYSTO (KITS) ×2 IMPLANT
PACK DNC HYST (MISCELLANEOUS) ×2 IMPLANT
PAD OB MATERNITY 4.3X12.25 (PERSONAL CARE ITEMS) ×2 IMPLANT
PAD PREP 24X41 OB/GYN DISP (PERSONAL CARE ITEMS) ×2 IMPLANT
SOL .9 NS 3000ML IRR  AL (IV SOLUTION) ×1
SOL .9 NS 3000ML IRR UROMATIC (IV SOLUTION) ×1 IMPLANT
TUBING CONNECTING 10 (TUBING) ×2 IMPLANT

## 2018-01-02 NOTE — H&P (Signed)
Tracey Lin is a 57 y.o. female here for Pre Op Consulting .   HPI:  Pt presents for a preoperative visit to schedule a D&C, hysteroscopy, and polypectomy for postmenopausal bleeding.  Workup has included: EMBx; BENIGN ENDOMETRIAL POLYP.  Pap 2018: neg with neg HPV  -Endosee hysteroscopy findings: anterior endometrial polyp, attached at the fundus.  - Hx of PMB in 2016, with benign endocervical polyp. Plans for D&C placed and preop completed, but her deductible was so high that she has not moved forward with D&Cand had no more bleeding until this year.  Past Medical History:  has a past medical history of Anemia, Atrial fibrillation (CMS-HCC), History of kidney stones, Panic attacks, Paroxysmal atrial fibrillation (CMS-HCC), and Plantar fasciitis.  Past Surgical History:  has a past surgical history that includes Reduction mammaplasty; Colonoscopy (03/04/2011); Colonoscopy (05/25/2016); and egd (05/25/2016). Family History: family history includes Arthritis in her father; Diabetes in her father; Heart disease in her mother; High blood pressure (Hypertension) in her mother; Hyperlipidemia (Elevated cholesterol) in her mother; Pancreatic cancer in her father; Thyroid disease in her father. Social History:  reports that she has never smoked. She has never used smokeless tobacco. She reports current alcohol use. She reports that she does not use drugs. OB/GYN History:          OB History    Gravida  2   Para  2   Term  2   Preterm      AB      Living  2     SAB      TAB      Ectopic      Molar      Multiple      Live Births             Allergies: is allergic to codeine and iodine and iodide containing products. Medications:  Current Outpatient Medications:  .  ALPRAZolam (XANAX) 0.5 MG tablet, Take 0.5 mg by mouth nightly as needed for Sleep, Disp: , Rfl:  .  aspirin 81 MG EC tablet, Take 81 mg by mouth once daily., Disp: , Rfl:  .  atenolol  (TENORMIN) 50 MG tablet, TAKE 1 TABLET (50 MG TOTAL) BY MOUTH ONCE DAILY., Disp: 90 tablet, Rfl: 3 .  cyanocobalamin (VITAMIN B12) 1,000 mcg/mL injection, Inject 1 mL (1,000 mcg total) into the muscle monthly., Disp: 1 mL, Rfl: 11 .  omeprazole (PRILOSEC) 40 MG DR capsule, Take 1 capsule (40 mg total) by mouth once daily, Disp: 90 capsule, Rfl: 2 .  zolpidem (AMBIEN) 5 MG tablet, Take 2.5 mg by mouth nightly as needed for Sleep, Disp: , Rfl:   Current Facility-Administered Medications:  .  cyanocobalamin (VITAMIN B12) injection 1,000 mcg, 1,000 mcg, Intramuscular, Q30 Days, Leotis Shames, MD, 1,000 mcg at 11/11/17 1645  Review of Systems: No SOB, no palpitations or chest pain, no new lower extremity edema, no nausea or vomiting or bowel or bladder complaints. See HPI for gyn specific ROS.   Exam:      Vitals:   12/13/17 1137  BP: 128/76    WDWN   female in NAD Body mass index is 35.05 kg/m.  General: Patient is well-groomed, well-nourished, appears stated age in no acute distress  HEENT: head is atraumatic and normocephalic, trachea is midline, neck is supple with no palpable nodules  CV: Regular rhythm and normal heart rate, no murmur  Pulm: Clear to auscultation throughout lung fields with no wheezing, crackles, or rhonchi. No increased  work of breathing  Abdomen: soft , no mass, non-tender, no rebound tenderness, no hepatomegaly  Pelvic: Deferred  Impression:   The primary encounter diagnosis was Post-menopausal bleeding. A diagnosis of Endometrial polyp was also pertinent to this visit.    Plan:   -  Preoperative visit: D&C hysteroscopy, and Myosure polypectomy. Consents signed tody.   Risks of surgery were discussed with the patient including but not limited to: bleeding which may require transfusion; infection which may require antibiotics; injury to uterus or surrounding organs; intrauterine scarring which may impair future fertility; need for  additional procedures including laparotomy or laparoscopy; and other postoperative/anesthesia complications. Written informed consent was obtained.  This is a scheduled same-day surgery. She will have a postop visit in 2 weeks to review operative findings and pathology.

## 2018-01-02 NOTE — Anesthesia Preprocedure Evaluation (Signed)
Anesthesia Evaluation  Patient identified by MRN, date of birth, ID band Patient awake    Reviewed: Allergy & Precautions, NPO status , Patient's Chart, lab work & pertinent test results  History of Anesthesia Complications (+) DIFFICULT AIRWAY and history of anesthetic complications (narrow trachea)  Airway Mallampati: II  TM Distance: >3 FB Neck ROM: Full    Dental no notable dental hx.    Pulmonary sleep apnea and Continuous Positive Airway Pressure Ventilation , neg COPD,    breath sounds clear to auscultation- rhonchi (-) wheezing      Cardiovascular (-) hypertension(-) CAD, (-) Past MI, (-) Cardiac Stents and (-) CABG + dysrhythmias (PAF) Atrial Fibrillation  Rhythm:Regular Rate:Normal - Systolic murmurs and - Diastolic murmurs    Neuro/Psych neg Seizures Anxiety negative neurological ROS     GI/Hepatic Neg liver ROS, GERD  ,  Endo/Other  negative endocrine ROSneg diabetes  Renal/GU negative Renal ROS     Musculoskeletal negative musculoskeletal ROS (+)   Abdominal (+) + obese,   Peds  Hematology  (+) anemia ,   Anesthesia Other Findings Past Medical History: No date: Anxiety No date: Difficult intubation     Comment:  "SMALL TRACHEA" No date: Dysrhythmia No date: GERD (gastroesophageal reflux disease) No date: History of kidney stones     Comment:  H/O No date: OSA (obstructive sleep apnea)     Comment:  USES CPAP ( rarely) No date: Paroxysmal atrial fibrillation (HCC)   Reproductive/Obstetrics                             Anesthesia Physical Anesthesia Plan  ASA: II  Anesthesia Plan: General   Post-op Pain Management:    Induction: Intravenous  PONV Risk Score and Plan: 2 and Ondansetron, Midazolam and Dexamethasone  Airway Management Planned: LMA  Additional Equipment:   Intra-op Plan:   Post-operative Plan:   Informed Consent: I have reviewed the patients  History and Physical, chart, labs and discussed the procedure including the risks, benefits and alternatives for the proposed anesthesia with the patient or authorized representative who has indicated his/her understanding and acceptance.   Dental advisory given  Plan Discussed with: CRNA and Anesthesiologist  Anesthesia Plan Comments:         Anesthesia Quick Evaluation

## 2018-01-02 NOTE — Transfer of Care (Signed)
Immediate Anesthesia Transfer of Care Note  Patient: Tracey DubinBarbara A Lin  Procedure(s) Performed: DILATATION & CURETTAGE/HYSTEROSCOPY WITH MYOSURE, polypectomy (N/A )  Patient Location: PACU  Anesthesia Type:General  Level of Consciousness: awake, alert  and oriented  Airway & Oxygen Therapy: Patient connected to face mask  Post-op Assessment: Report given to RN and Post -op Vital signs reviewed and stable  Post vital signs: Reviewed  Last Vitals:  Vitals Value Taken Time  BP 147/78 01/02/2018 11:52 AM  Temp 36.1 C 01/02/2018 11:52 AM  Pulse 67 01/02/2018 11:54 AM  Resp 11 01/02/2018 11:54 AM  SpO2 100 % 01/02/2018 11:54 AM  Vitals shown include unvalidated device data.  Last Pain:  Vitals:   01/02/18 1152  TempSrc:   PainSc: 10-Worst pain ever         Complications: No apparent anesthesia complications

## 2018-01-02 NOTE — Anesthesia Postprocedure Evaluation (Signed)
Anesthesia Post Note  Patient: Tracey DubinBarbara A Lin  Procedure(s) Performed: DILATATION & CURETTAGE/HYSTEROSCOPY WITH MYOSURE, polypectomy (N/A )  Patient location during evaluation: PACU Anesthesia Type: General Level of consciousness: awake and alert and oriented Pain management: pain level controlled Vital Signs Assessment: post-procedure vital signs reviewed and stable Respiratory status: spontaneous breathing, nonlabored ventilation and respiratory function stable Cardiovascular status: blood pressure returned to baseline and stable Postop Assessment: no signs of nausea or vomiting Anesthetic complications: no     Last Vitals:  Vitals:   01/02/18 1237 01/02/18 1253  BP: 133/70 133/77  Pulse: (!) 57 (!) 59  Resp: 11 16  Temp:  36.4 C  SpO2: 100% 98%    Last Pain:  Vitals:   01/02/18 1253  TempSrc: Temporal  PainSc: 0-No pain                 Elmond Poehlman

## 2018-01-02 NOTE — Discharge Instructions (Addendum)
Discharge instructions after a hysteroscopy with dilation and curettage  Signs and Symptoms to Report  Call our office at [phone] if you have any of the following:   . Fever over 100.4 degrees or higher . Severe stomach pain not relieved with pain medications . Bright red bleeding that's heavier than a period that does not slow with rest after the first 24 hours . To go the bathroom a lot (frequency), you can't hold your urine (urgency), or it hurts when you empty your bladder (urinate) . Chest pain . Shortness of breath . Pain in the calves of your legs . Severe nausea and vomiting not relieved with anti-nausea medications . Any concerns  What You Can Expect after Surgery . You may see some pink tinged, bloody fluid. This is normal. You may also have cramping for several days.   Activities after Your Discharge Follow these guidelines to help speed your recovery at home: . Don't drive if you are in pain or taking narcotic pain medicine. You may drive when you can safely slam on the brakes, turn the wheel forcefully, and rotate your torso comfortably. This is typically 4-7 days. Practice in a parking lot or side street prior to attempting to drive regularly.  . Ask others to help with household chores for 4 weeks. . Don't do strenuous activities, exercises, or sports like vacuuming, tennis, squash, etc. until your doctor says it is safe to do so. . Walk as you feel able. Rest often since it may take a week or two for your energy level to return to normal.  . You may climb stairs . Avoid constipation:   -Eat fruits, vegetables, and whole grains. Eat small meals as your appetite will take time to return to normal.   -Drink 6 to 8 glasses of water each day unless your doctor has told you to limit your fluids.   -Use a laxative or stool softener as needed if constipation becomes a problem. You may take Miralax, metamucil, Citrucil, Colace, Senekot, FiberCon, etc. If this does not  relieve the constipation, try two tablespoons of Milk Of Magnesia every 8 hours until your bowels move.  . You may shower.  . Do not get in a hot tub, swimming pool, etc. until your doctor agrees. . Do not douche, use tampons, or have sex until your doctor says it is okay, usually about 2 weeks. . Take your pain medicine when you need it. The medicine may not work as well if the pain is bad.  Take the medicines you were taking before surgery. Other medications you might need are pain medications (ibuprofen), medications for constipation (Colace) and nausea medications (Zofran).        AMBULATORY SURGERY  DISCHARGE INSTRUCTIONS   1) The drugs that you were given will stay in your system until tomorrow so for the next 24 hours you should not:  A) Drive an automobile B) Make any legal decisions C) Drink any alcoholic beverage   2) You may resume regular meals tomorrow.  Today it is better to start with liquids and gradually work up to solid foods.  You may eat anything you prefer, but it is better to start with liquids, then soup and crackers, and gradually work up to solid foods.   3) Please notify your doctor immediately if you have any unusual bleeding, trouble breathing, redness and pain at the surgery site, drainage, fever, or pain not relieved by medication.    4) Additional Instructions:          Please contact your physician with any problems or Same Day Surgery at [phone], Monday through Friday 6 am to 4 pm, or [place] [facility] [place] number at [phone]. 

## 2018-01-02 NOTE — Anesthesia Post-op Follow-up Note (Signed)
Anesthesia QCDR form completed.        

## 2018-01-02 NOTE — Op Note (Signed)
Operative Report Hysteroscopy with Dilation and Curettage   Indications: Postmenopausal bleeding   Pre-operative Diagnosis: Endometrial polyp   Post-operative Diagnosis: same.  Procedure: 1. Exam under anesthesia 2. Fractional D&C 3. Hysteroscopy  Surgeon: Christeen DouglasBethany Shakeia Krus, MD  Assistant(s):  None  Anesthesia: General LMA anesthesia  Anesthesiologist: Alver FisherPenwarden, Amy, MD Anesthesiologist: Alver FisherPenwarden, Amy, MD CRNA: Iran PlanasHolmes, Jason P, CRNA  Estimated Blood Loss:  Minimal         Intraoperative medications: n/a         Total IV Fluids: 600ml  Urine Output: 50ml  Total Fluid Deficit:  60 mL          Specimens: Endocervical curettings, endometrial curettings, Myosure endometrial curettings         Complications:  None; patient tolerated the procedure well.         Disposition: PACU - hemodynamically stable.         Condition: stable  Findings: Uterus measuring 8 cm by sound; normal cervix, vagina, perineum. Anterior endometrial polyp and left cornual polyp   Indication for procedure/Consents: 57 y.o. with postmenopausal bleeding and an anterior polyp noted on office hysteroscopy,  here for scheduled surgery for the aforementioned diagnoses.  She has previously been dx with endometrial benign polyp years ago, and her PMB returned this year. Risks of surgery were discussed with the patient including but not limited to: bleeding which may require transfusion; infection which may require antibiotics; injury to uterus or surrounding organs; intrauterine scarring which may impair future fertility; need for additional procedures including laparotomy or laparoscopy; and other postoperative/anesthesia complications. Written informed consent was obtained.    Procedure Details:   D&C/ Myosure  The patient was taken to the operating room where anesthesia was administered and was found to be adequate. After a formal and adequate timeout was performed, she was placed in the dorsal lithotomy  position and examined with the above findings. She was then prepped and draped in the sterile manner. Her bladder was catheterized for an estimated amount of clear, yellow urine. A weighed speculum was then placed in the patient's vagina and a single tooth tenaculum was applied to the anterior lip of the cervix.  Her cervix was serially dilated to 15 [place] using Hanks dilators. An ECC was performed. The hysteroscope was introduced under direct observation  Using lactated ringers as a distention medium to reveal the above findings. The uterine cavity was carefully examined, both ostia were recognized, and diffusely proliferative endometrium with the polyp above were noted.   This was resected using the Myosure device.  After further careful visualization of the uterine cavity, the hysteroscope was removed under direct visualization.  A sharp curettage was then performed until there was a gritty texture in all four quadrants. The tenaculum was removed from the anterior lip of the cervix and the vaginal speculum was removed after applying silver nitrate for good hemostasis.   The patient tolerated the procedure well and was taken to the recovery area awake and in stable condition. She received iv acetaminophen and Toradol prior to leaving the OR.  The patient will be discharged to home as per PACU criteria. Routine postoperative instructions given. She was prescribed Ibuprofen and Colace. She will follow up in the clinic in two weeks for postoperative evaluation.

## 2018-01-02 NOTE — Anesthesia Procedure Notes (Signed)
Procedure Name: LMA Insertion Date/Time: 01/02/2018 11:03 AM Performed by: Iran PlanasHolmes, Jonnette Nuon P, CRNA Pre-anesthesia Checklist: Patient identified, Emergency Drugs available, Suction available and Patient being monitored Patient Re-evaluated:Patient Re-evaluated prior to induction Oxygen Delivery Method: Circle system utilized Preoxygenation: Pre-oxygenation with 100% oxygen Induction Type: IV induction Ventilation: Mask ventilation without difficulty LMA: LMA inserted LMA Size: 4.0

## 2018-01-03 ENCOUNTER — Telehealth: Payer: Self-pay

## 2018-01-03 ENCOUNTER — Encounter: Payer: Self-pay | Admitting: Obstetrics and Gynecology

## 2018-01-03 LAB — SURGICAL PATHOLOGY

## 2018-01-03 NOTE — Telephone Encounter (Signed)
We have attempted to call the patient two times to schedule sleep study.  Patient has been unavailable at the phone numbers we have on file and has not returned our calls.  At this point we will send a letter asking patient to please contact the sleep lab to schedule their sleep study.  If patient calls back we will schedule them for their sleep study. 

## 2018-01-04 LAB — URINE CULTURE: Culture: NO GROWTH

## 2018-01-04 NOTE — Addendum Note (Signed)
Addendum  created 01/04/18 1450 by Iran PlanasHolmes, Tamee Battin P, CRNA   Charge Capture section accepted

## 2018-01-10 ENCOUNTER — Encounter (INDEPENDENT_AMBULATORY_CARE_PROVIDER_SITE_OTHER): Payer: BLUE CROSS/BLUE SHIELD

## 2018-02-07 ENCOUNTER — Ambulatory Visit (INDEPENDENT_AMBULATORY_CARE_PROVIDER_SITE_OTHER): Payer: BLUE CROSS/BLUE SHIELD | Admitting: Bariatrics

## 2018-02-07 ENCOUNTER — Encounter (INDEPENDENT_AMBULATORY_CARE_PROVIDER_SITE_OTHER): Payer: Self-pay | Admitting: Bariatrics

## 2018-02-07 VITALS — BP 121/77 | HR 61 | Temp 97.7°F | Ht 65.0 in | Wt 210.0 lb

## 2018-02-07 DIAGNOSIS — R0602 Shortness of breath: Secondary | ICD-10-CM

## 2018-02-07 DIAGNOSIS — Z6835 Body mass index (BMI) 35.0-35.9, adult: Secondary | ICD-10-CM

## 2018-02-07 DIAGNOSIS — E559 Vitamin D deficiency, unspecified: Secondary | ICD-10-CM

## 2018-02-07 DIAGNOSIS — Z9189 Other specified personal risk factors, not elsewhere classified: Secondary | ICD-10-CM

## 2018-02-07 DIAGNOSIS — G4733 Obstructive sleep apnea (adult) (pediatric): Secondary | ICD-10-CM

## 2018-02-07 DIAGNOSIS — R5383 Other fatigue: Secondary | ICD-10-CM | POA: Diagnosis not present

## 2018-02-07 DIAGNOSIS — Z0289 Encounter for other administrative examinations: Secondary | ICD-10-CM

## 2018-02-07 DIAGNOSIS — R7309 Other abnormal glucose: Secondary | ICD-10-CM

## 2018-02-07 DIAGNOSIS — E538 Deficiency of other specified B group vitamins: Secondary | ICD-10-CM | POA: Diagnosis not present

## 2018-02-07 DIAGNOSIS — I480 Paroxysmal atrial fibrillation: Secondary | ICD-10-CM

## 2018-02-07 DIAGNOSIS — Z1331 Encounter for screening for depression: Secondary | ICD-10-CM

## 2018-02-08 LAB — COMPREHENSIVE METABOLIC PANEL
ALBUMIN: 4.4 g/dL (ref 3.5–5.5)
ALT: 25 IU/L (ref 0–32)
AST: 21 IU/L (ref 0–40)
Albumin/Globulin Ratio: 1.9 (ref 1.2–2.2)
Alkaline Phosphatase: 98 IU/L (ref 39–117)
BUN/Creatinine Ratio: 17 (ref 9–23)
BUN: 11 mg/dL (ref 6–24)
Bilirubin Total: 0.3 mg/dL (ref 0.0–1.2)
CO2: 23 mmol/L (ref 20–29)
CREATININE: 0.66 mg/dL (ref 0.57–1.00)
Calcium: 9 mg/dL (ref 8.7–10.2)
Chloride: 102 mmol/L (ref 96–106)
GFR calc Af Amer: 114 mL/min/{1.73_m2} (ref >59)
GFR calc non Af Amer: 99 mL/min/{1.73_m2} (ref >59)
Globulin, Total: 2.3 g/dL (ref 1.5–4.5)
Glucose: 89 mg/dL (ref 65–99)
Potassium: 4.4 mmol/L (ref 3.5–5.2)
Sodium: 141 mmol/L (ref 134–144)
Total Protein: 6.7 g/dL (ref 6.0–8.5)

## 2018-02-08 LAB — LIPID PANEL WITH LDL/HDL RATIO
CHOLESTEROL TOTAL: 220 mg/dL — AB (ref 100–199)
HDL: 59 mg/dL (ref >39)
LDL Calculated: 136 mg/dL — ABNORMAL HIGH (ref 0–99)
LDl/HDL Ratio: 2.3 ratio (ref 0.0–3.2)
Triglycerides: 123 mg/dL (ref 0–149)
VLDL Cholesterol Cal: 25 mg/dL (ref 5–40)

## 2018-02-08 LAB — T3: T3, Total: 118 ng/dL (ref 71–180)

## 2018-02-08 LAB — HEMOGLOBIN A1C
Est. average glucose Bld gHb Est-mCnc: 103 mg/dL
Hgb A1c MFr Bld: 5.2 % (ref 4.8–5.6)

## 2018-02-08 LAB — T4, FREE: Free T4: 1.33 ng/dL (ref 0.82–1.77)

## 2018-02-08 LAB — VITAMIN D 25 HYDROXY (VIT D DEFICIENCY, FRACTURES): Vit D, 25-Hydroxy: 10.5 ng/mL — ABNORMAL LOW (ref 30.0–100.0)

## 2018-02-08 LAB — VITAMIN B12: Vitamin B-12: 785 pg/mL (ref 232–1245)

## 2018-02-08 LAB — TSH: TSH: 2.35 u[IU]/mL (ref 0.450–4.500)

## 2018-02-08 LAB — INSULIN, RANDOM: INSULIN: 9.3 u[IU]/mL (ref 2.6–24.9)

## 2018-02-08 NOTE — Progress Notes (Signed)
Office: [phone]  /  Fax: [phone]   Dear Dr. Vickey Hugerohmeier,   Thank you for referring Jennette DubinBarbara A Near to our clinic. The following note includes my evaluation and treatment recommendations.  HPI:   Chief Complaint: OBESITY    Jennette DubinBarbara A Verbeke has been referred by Melvyn Novasarmen Dohmeier, MD for consultation regarding her obesity and obesity related comorbidities.    Jennette DubinBarbara A Matheson (MR# [id number]) is a 58 y.o. female who presents on 02/08/2018 for obesity evaluation and treatment. Current BMI is Body mass index is 34.95 kg/m.Britta Mccreedy. Shealeigh has been struggling with her weight for many years and has been unsuccessful in either losing weight, maintaining weight loss, or reaching her healthy weight goal.     Britta MccreedyBarbara attended our information session and states she is currently in the action stage of change and ready to dedicate time achieving and maintaining a healthier weight. Britta MccreedyBarbara is interested in becoming our patient and working on intensive lifestyle modifications including (but not limited to) diet, exercise and weight loss.    Britta MccreedyBarbara states her desired weight loss is 30 lbs she started gaining weight in her 40's her heaviest weight ever was 220 lbs. she does crave salty and sweet foods she snacks frequently in the evenings she skips meals frequently she frequently makes poor food choices she struggles with emotional eating    Fatigue Britta MccreedyBarbara feels her energy is lower than it should be. This has worsened with weight gain and has not worsened recently. Britta MccreedyBarbara admits to daytime somnolence and she admits to waking up still tired. Patient has a history of obstructive sleep apnea, which may contribute to her fatigue. Patent has a history of symptoms of daytime fatigue and morning fatigue. Patient generally gets 4 or 5 hours of sleep per night, and states they generally have restless sleep. Snoring is present. Apneic episodes are present. Epworth Sleepiness Score is 5  Dyspnea on  exertion Britta MccreedyBarbara notes increasing shortness of breath with exercising and seems to be worsening over time with weight gain. She notes getting out of breath sooner with activity than she used to. This has not gotten worse recently. Britta MccreedyBarbara denies orthopnea.  Sleep Apnea Britta MccreedyBarbara has a history of mild sleep apnea and she rarely wears her CPAP.  She does not wear her CPAP if she has a cold or allergies.         Marland Kitchen.   B12 Deficiency Britta MccreedyBarbara has a diagnosis of B12 insufficiency and notes fatigue. This is not a new diagnosis. Britta MccreedyBarbara is not a vegetarian and does not have a previous diagnosis of pernicious anemia. She does not have a history of weight loss surgery. Britta MccreedyBarbara gets an injection monthly.  Elevated Glucose Britta MccreedyBarbara has a history of some elevated blood glucose readings without a diagnosis of diabetes. She denies polyphagia.  Vitamin D deficiency Britta MccreedyBarbara has a diagnosis of vitamin D deficiency. She is not currently taking vit D and denies nausea, vomiting or muscle weakness.  At risk for osteopenia and osteoporosis Britta MccreedyBarbara is at higher risk of osteopenia and osteoporosis due to vitamin D deficiency.   Paroxysmal Atrial Fibrillation Britta MccreedyBarbara has a history of paroxysmal Afib and she is seeing a cardiologist.  Depression Screen Renn's Food and Mood (modified PHQ-9) score was  Depression screen PHQ 2/9 02/07/2018  Decreased Interest 0  Down, Depressed, Hopeless 0  PHQ - 2 Score 0  Altered sleeping 3  Tired, decreased energy 3  Change in appetite 1  Feeling bad or failure about yourself  0  Trouble  concentrating 1  Moving slowly or fidgety/restless 0  Suicidal thoughts 0  PHQ-9 Score 8  Difficult doing work/chores Not difficult at all    ASSESSMENT AND PLAN:  Other fatigue - Plan: EKG 12-Lead, T3, T4, free, TSH  Shortness of breath on exertion - Plan: Lipid Panel With LDL/HDL Ratio  Obstructive sleep apnea syndrome  B12 nutritional deficiency - Plan: Vitamin B12  Elevated  glucose - Plan: Comprehensive metabolic panel, Hemoglobin A1c, Insulin, random  Vitamin D deficiency - Plan: VITAMIN D 25 Hydroxy (Vit-D Deficiency, Fractures)  Paroxysmal atrial fibrillation (HCC)  Depression screening  At risk for osteoporosis  Class 2 severe obesity with serious comorbidity and body mass index (BMI) of 35.0 to 35.9 in adult, unspecified obesity type (HCC)  PLAN:  Fatigue Terilynn was informed that her fatigue may be related to obesity, depression or many other causes. Labs will be ordered, and in the meanwhile Stevey has agreed to work on diet, exercise and weight loss to help with fatigue. Proper sleep hygiene was discussed including the need for 7-8 hours of quality sleep each night. A sleep study was not ordered based on symptoms and Epworth score.  Dyspnea on exertion Kaysia's shortness of breath appears to be obesity related and exercise induced. She has agreed to work on weight loss and gradually increase exercise to treat her exercise induced shortness of breath. If Lenetta follows our instructions and loses weight without improvement of her shortness of breath, we will plan to refer to pulmonology. We will monitor this condition regularly. Kaysa agrees to this plan.  Sleep Apnea We discussed the importance of sleep apnea today. Verenis agrees to follow up with our clinic in 2 weeks.  B12 Deficiency Mikalynn will work on increasing B12 rich foods in her diet. B12 supplementation was not prescribed today. We will check B12 level today and Janelda will follow up as directed.  Elevated Glucose Fasting labs (insulin level, Hgb A1c) will be obtained and results with be discussed with Britta Mccreedy in 2 weeks at her follow up visit. In the meanwhile Shelicia was started on a lower simple carbohydrate diet and will work on weight loss efforts.  Vitamin D Deficiency Kathyjo was informed that low vitamin D levels contributes to fatigue and are associated with obesity,  breast, and colon cancer. We will check vitamin D level today and she will follow up for routine testing of vitamin D, at least 2-3 times per year. Scotlyn agrees to follow up with our clinic in 2 weeks.  At risk for osteopenia and osteoporosis Laini was given extended  (15 minutes) osteoporosis prevention counseling today. Havanah is at risk for osteopenia and osteoporosis due to her vitamin D deficiency. She was encouraged to take her vitamin D and follow her higher calcium diet and increase strengthening exercise to help strengthen her bones and decrease her risk of osteopenia and osteoporosis.  Paroxysmal Atrial Fibrillation Paulanne will continue to see her cardiologist periodically and she will follow up with our clinic in 2 weeks.  Depression Screen Bambie had a mildly positive depression screening. Depression is commonly associated with obesity and often results in emotional eating behaviors. We will monitor this closely and work on CBT to help improve the non-hunger eating patterns. Referral to Psychology may be required if no improvement is seen as she continues in our clinic.  Obesity Saeeda is currently in the action stage of change and her goal is to continue with weight loss efforts. I recommend Shawntai begin the structured treatment  plan as follows:  She has agreed to follow the Category 3 plan Britta MccreedyBarbara has been instructed to eventually work up to a goal of 150 minutes of combined cardio and strengthening exercise per week for weight loss and overall health benefits. We discussed the following Behavioral Modification Strategies today: increase H2O intake, keeping healthy foods in the home, increasing lean protein intake, decreasing simple carbohydrates, decreasing improper snacking, increasing vegetables and work on meal planning and easy cooking plans   She was informed of the importance of frequent follow up visits to maximize her success with intensive lifestyle modifications  for her multiple health conditions. She was informed we would discuss her lab results at her next visit unless there is a critical issue that needs to be addressed sooner. Britta MccreedyBarbara agreed to keep her next visit at the agreed upon time to discuss these results.  ALLERGIES: Allergies  Allergen Reactions  . Codeine Nausea And Vomiting  . Ivp Dye [Iodinated Diagnostic Agents] Itching    MEDICATIONS: Current Outpatient Medications on File Prior to Visit  Medication Sig Dispense Refill  . ALPRAZolam (XANAX) 0.5 MG tablet Take 0.5-1 tablets (0.25-0.5 mg total) by mouth at bedtime as needed for anxiety. 30 tablet 0  . aspirin EC 81 MG tablet Take 81 mg by mouth daily.    Marland Kitchen. atenolol (TENORMIN) 50 MG tablet Take 50 mg by mouth every evening.   3  . acetaminophen (TYLENOL) 500 MG tablet Take 2 tablets (1,000 mg total) by mouth every 8 (eight) hours. (Patient not taking: Reported on 02/07/2018) 90 tablet 2  . Cyanocobalamin (VITAMIN B-12 IJ) Inject 1,000 mcg as directed every 30 (thirty) days.    Marland Kitchen. HYDROcodone-acetaminophen (NORCO) 5-325 MG tablet Take 1-2 tablets by mouth every 4 (four) hours as needed for moderate pain or severe pain. (Patient not taking: Reported on 02/07/2018) 20 tablet 0  . ibuprofen (ADVIL,MOTRIN) 800 MG tablet Take 1 tablet (800 mg total) by mouth every 8 (eight) hours as needed for moderate pain. (Patient not taking: Reported on 02/07/2018) 30 tablet 1  . omeprazole (PRILOSEC) 20 MG capsule Take 20 mg by mouth daily as needed (heartburn).    . ondansetron (ZOFRAN ODT) 4 MG disintegrating tablet Take 1 tablet (4 mg total) by mouth every 8 (eight) hours as needed for nausea or vomiting. (Patient not taking: Reported on 12/26/2017) 20 tablet 0  . zolpidem (AMBIEN) 5 MG tablet Take 5 mg by mouth at bedtime as needed for sleep.     No current facility-administered medications on file prior to visit.     PAST MEDICAL HISTORY: Past Medical History:  Diagnosis Date  . Anxiety   . B12  deficiency   . Difficult intubation    "SMALL TRACHEA"  . Dysrhythmia   . GERD (gastroesophageal reflux disease)   . History of kidney stones    H/O  . OSA (obstructive sleep apnea)    USES CPAP ( rarely)  . Palpitations   . Paroxysmal atrial fibrillation (HCC)     PAST SURGICAL HISTORY: Past Surgical History:  Procedure Laterality Date  . AUGMENTATION MAMMAPLASTY  2003  . BREAST BIOPSY Right 2005   benign  . COLONOSCOPY    . DILATATION & CURETTAGE/HYSTEROSCOPY WITH MYOSURE N/A 01/02/2018   Procedure: DILATATION & CURETTAGE/HYSTEROSCOPY WITH MYOSURE, polypectomy;  Surgeon: Christeen DouglasBeasley, Bethany, MD;  Location: [facility];  Service: Gynecology;  Laterality: N/A;  . ESOPHAGOGASTRODUODENOSCOPY    . KNEE ARTHROSCOPY Left 12/02/2017   Procedure: ARTHROSCOPY KNEE WITH PARTIAL MEDIAL MENISECTOMY,  REMOVAL OF LOOSE BODY;  Surgeon: Signa Kell, MD;  Location: [facility];  Service: Orthopedics;  Laterality: Left;  . REDUCTION MAMMAPLASTY  2007  . TUMMY TUCK  2006    SOCIAL HISTORY: Social History   Tobacco Use  . Smoking status: Never Smoker  . Smokeless tobacco: Never Used  Substance Use Topics  . Alcohol use: Yes    Frequency: Never    Comment: OCC WINE  . Drug use: Never    FAMILY HISTORY: Family History  Problem Relation Age of Onset  . CAD Mother   . Heart disease Mother   . Pancreatic cancer Father     ROS: Review of Systems  Constitutional: Positive for malaise/fatigue.  Respiratory: Positive for shortness of breath (on exertion).   Cardiovascular: Positive for palpitations. Negative for orthopnea.  Gastrointestinal: Negative for nausea and vomiting.  Musculoskeletal:       Negative for muscle weakness  Endo/Heme/Allergies:       Negative for polyphagia    PHYSICAL EXAM: Blood pressure 121/77, pulse 61, temperature 97.7 F (36.5 C), temperature source Oral, height 5\' 5"  (1.651 m), weight 210 lb (95.3 kg), last menstrual period 08/09/2011, SpO2 96 %. Body mass index  is 34.95 kg/m. Physical Exam Vitals signs reviewed.  Constitutional:      Appearance: Normal appearance. She is well-developed. She is obese.  HENT:     Head: Normocephalic and atraumatic.     Nose: Nose normal.     Mouth/Throat:     Comments: Mallampati = 4 Eyes:     General: No scleral icterus.    Extraocular Movements: Extraocular movements intact.  Neck:     Musculoskeletal: Normal range of motion and neck supple.     Thyroid: No thyromegaly.  Cardiovascular:     Rate and Rhythm: Normal rate and regular rhythm.  Pulmonary:     Effort: Pulmonary effort is normal. No respiratory distress.  Abdominal:     Palpations: Abdomen is soft.     Tenderness: There is no abdominal tenderness.  Musculoskeletal: Normal range of motion.     Comments: Range of Motion normal in all 4 extremities  Skin:    General: Skin is warm and dry.  Neurological:     Mental Status: She is alert and oriented to person, place, and time.     Coordination: Coordination normal.  Psychiatric:        Mood and Affect: Mood normal.        Behavior: Behavior normal.     RECENT LABS AND TESTS: BMET    Component Value Date/Time   NA 141 02/07/2018 1029   K 4.4 02/07/2018 1029   CL 102 02/07/2018 1029   CO2 23 02/07/2018 1029   GLUCOSE 89 02/07/2018 1029   GLUCOSE 99 08/18/2017 1120   BUN 11 02/07/2018 1029   CREATININE 0.66 02/07/2018 1029   CALCIUM 9.0 02/07/2018 1029   GFRNONAA 99 02/07/2018 1029   GFRAA 114 02/07/2018 1029   Lab Results  Component Value Date   HGBA1C 5.2 02/07/2018   Lab Results  Component Value Date   INSULIN 9.3 02/07/2018   CBC    Component Value Date/Time   WBC 6.6 08/19/2017 0549   RBC 4.17 08/19/2017 0549   HGB 12.7 08/19/2017 0549   HCT 37.1 08/19/2017 0549   PLT 272 08/19/2017 0549   MCV 88.8 08/19/2017 0549   MCH 30.5 08/19/2017 0549   MCHC 34.4 08/19/2017 0549   RDW 13.0 08/19/2017 0549   LYMPHSABS  2.5 08/19/2017 0549   MONOABS 0.4 08/19/2017 0549    EOSABS 0.2 08/19/2017 0549   BASOSABS 0.0 08/19/2017 0549   Iron/TIBC/Ferritin/ %Sat No results found for: IRON, TIBC, FERRITIN, IRONPCTSAT Lipid Panel     Component Value Date/Time   CHOL 220 (H) 02/07/2018 1029   TRIG 123 02/07/2018 1029   HDL 59 02/07/2018 1029   LDLCALC 136 (H) 02/07/2018 1029   Hepatic Function Panel     Component Value Date/Time   PROT 6.7 02/07/2018 1029   ALBUMIN 4.4 02/07/2018 1029   AST 21 02/07/2018 1029   ALT 25 02/07/2018 1029   ALKPHOS 98 02/07/2018 1029   BILITOT 0.3 02/07/2018 1029      Component Value Date/Time   TSH 2.350 02/07/2018 1029    ECG  shows NSR with a rate of 64 BPM INDIRECT CALORIMETER done today shows a VO2 of 251 and a REE of 1745.  Her calculated basal metabolic rate is 4098 thus her basal metabolic rate is better than expected.       OBESITY BEHAVIORAL INTERVENTION VISIT  Today's visit was # 1   Starting weight: 210 lbs Starting date: 02/07/2018 Today's weight : 210 lbs  Today's date: 02/07/2018 Total lbs lost to date: 0   ASK: We discussed the diagnosis of obesity with [name] today and Susannah agreed to give [place] permission to discuss obesity behavioral modification therapy today.  ASSESS: Emillie has the diagnosis of obesity and her BMI today is 34.95 Avalina is in the action stage of change   ADVISE: Creola was educated on the multiple health risks of obesity as well as the benefit of weight loss to improve her health. She was advised of the need for long term treatment and the importance of lifestyle modifications to improve her current health and to decrease her risk of future health problems.  AGREE: Multiple dietary modification options and treatment options were discussed and  Marcella agreed to follow the recommendations documented in the above note.  ARRANGE: Ladelle was educated on the importance of frequent visits to treat obesity as outlined per CMS and USPSTF guidelines and agreed to  schedule her next follow up appointment today.  Cristi Loron, am acting as Energy manager for [employer]. Manson Passey, DO  I have reviewed the above documentation for accuracy and completeness, and I agree with the above. -Corinna Capra, DO

## 2018-02-09 ENCOUNTER — Encounter (INDEPENDENT_AMBULATORY_CARE_PROVIDER_SITE_OTHER): Payer: Self-pay | Admitting: Bariatrics

## 2018-02-18 ENCOUNTER — Emergency Department (HOSPITAL_COMMUNITY): Payer: BLUE CROSS/BLUE SHIELD

## 2018-02-18 ENCOUNTER — Emergency Department (HOSPITAL_COMMUNITY)
Admission: EM | Admit: 2018-02-18 | Discharge: 2018-02-18 | Disposition: A | Discharge status: Home / Self Care | Payer: BLUE CROSS/BLUE SHIELD | Attending: Emergency Medicine | Admitting: Emergency Medicine

## 2018-02-18 ENCOUNTER — Telehealth: Payer: Self-pay | Admitting: Student

## 2018-02-18 ENCOUNTER — Other Ambulatory Visit: Payer: Self-pay

## 2018-02-18 ENCOUNTER — Encounter (HOSPITAL_COMMUNITY): Payer: Self-pay | Admitting: Emergency Medicine

## 2018-02-18 DIAGNOSIS — I4891 Unspecified atrial fibrillation: Secondary | ICD-10-CM

## 2018-02-18 DIAGNOSIS — Z0189 Encounter for other specified special examinations: Secondary | ICD-10-CM | POA: Insufficient documentation

## 2018-02-18 LAB — CBC
HCT: 44 % (ref 36.0–46.0)
HEMOGLOBIN: 13.6 g/dL (ref 12.0–15.0)
MCH: 29 pg (ref 26.0–34.0)
MCHC: 30.9 g/dL (ref 30.0–36.0)
MCV: 93.8 fL (ref 80.0–100.0)
Platelets: 355 10*3/uL (ref 150–400)
RBC: 4.69 MIL/uL (ref 3.87–5.11)
RDW: 12.7 % (ref 11.5–15.5)
WBC: 8.1 10*3/uL (ref 4.0–10.5)
nRBC: 0 % (ref 0.0–0.2)

## 2018-02-18 LAB — BASIC METABOLIC PANEL
Anion gap: 8 (ref 5–15)
BUN: 13 mg/dL (ref 6–20)
CO2: 25 mmol/L (ref 22–32)
CREATININE: 0.71 mg/dL (ref 0.44–1.00)
Calcium: 8.8 mg/dL — ABNORMAL LOW (ref 8.9–10.3)
Chloride: 106 mmol/L (ref 98–111)
GFR calc Af Amer: >60 mL/min (ref >60)
GFR calc non Af Amer: >60 mL/min (ref >60)
Glucose, Bld: 121 mg/dL — ABNORMAL HIGH (ref 70–99)
Potassium: 4 mmol/L (ref 3.5–5.1)
Sodium: 139 mmol/L (ref 135–145)

## 2018-02-18 LAB — TROPONIN I: Troponin I: <0.03 ng/mL (ref <0.03)

## 2018-02-18 LAB — MAGNESIUM: Magnesium: 2 mg/dL (ref 1.7–2.4)

## 2018-02-18 MED ORDER — SODIUM CHLORIDE 0.9 % IV BOLUS
500.0000 mL | Freq: Once | INTRAVENOUS | Status: AC
  Administered 2018-02-18: 500 mL via INTRAVENOUS

## 2018-02-18 MED ORDER — ETOMIDATE 2 MG/ML IV SOLN
8.0000 mg | Freq: Once | INTRAVENOUS | Status: AC
  Administered 2018-02-18: 8 mg via INTRAVENOUS
  Filled 2018-02-18: qty 10

## 2018-02-18 MED ORDER — APIXABAN 5 MG PO TABS: 5.0000 mg | ORAL_TABLET | Freq: Two times a day (BID) | ORAL | 0 refills | Status: DC

## 2018-02-18 MED ORDER — METOPROLOL TARTRATE 5 MG/5ML IV SOLN: 10.0000 mg | Freq: Once | INTRAVENOUS | Status: DC

## 2018-02-18 NOTE — Telephone Encounter (Signed)
    Patient called the after-hours line reporting palpitations, weakness, and chest discomfort starting yesterday. Has a history of atrial fibrillation but previously stopped Eliquis and Multaq. She took both yesterday and this morning along with her regularly scheduled Atenolol and is still having persistent symptoms. She does not have a working BP cuff and therefore has been unable to check her HR or BP. She is still having chest discomfort and palpitations at this time and I recommended she come to the Emergency Dept for further evaluation.   She voiced understanding of this and was appreciate of the call. Made [facility] Triage aware.   Signed, Ellsworth Lennox, PA-C 02/18/2018, 9:25 AM Pager: [phone]

## 2018-02-18 NOTE — Discharge Instructions (Signed)
Please continue taking Eliquis 5 mg twice daily.  Please call the A. fib clinic tomorrow to schedule appointment for follow-up.  Please also inform your cardiologist of today's visit to the ED.  Please return to the emergency department for any chest pain, shortness of breath, palpitations or any new or worsening symptoms.

## 2018-02-18 NOTE — Sedation Documentation (Signed)
Shocked at [employer]

## 2018-02-18 NOTE — ED Triage Notes (Signed)
Reports taking atenolol, aspirin, eloquis this morning

## 2018-02-18 NOTE — ED Notes (Signed)
Pt verbalized understanding of d/c instructions and has no further questions, VSS, NAD. Pt in NSR. Pt to follow up with a-fib clinic. Axox4. Pt d/c home with friend driving.

## 2018-02-18 NOTE — ED Provider Notes (Signed)
[facility] Provider Note   CSN: [id number] Arrival date & time: 02/18/18  0941     History   Chief Complaint Chief Complaint  Patient presents with  . Atrial Fibrillation    HPI Tracey Lin is a 58 y.o. female.  HPI   58 year old female with history of anxiety, paroxysmal A. fib, nephrolithiasis, GERD, who presents emergency department today for evaluation of palpitations.  Patient presented to the ED complaining she feels like she is in A. fib.  She states she has been in A. fib in the past and her symptoms today feel exactly similar.  She complains of palpitations, lightheadedness, dizziness, neck pain, shortness of breath and mild chest pain/tightness when symptoms began.  She states she took her normal dose of 50 mg atenolol and 81 mg aspirin..,  She then contacted her cardiology office, Dr. Johney FrameAllred, and spoke with the PA who advised her to take her dronedarone 400 mg at 6:30 AM and to take her Eliquis which she took at 7 AM.  States that since then symptoms have improved somewhat but have not resolved.  She denies any recent fevers.  She denies any chest pain or shortness of breath or palpitations last night.  She is had no lower extremity swelling or hemoptysis.  No abdominal pain nausea vomiting diarrhea or urinary symptoms.  Past Medical History:  Diagnosis Date  . Anxiety   . B12 deficiency   . Difficult intubation    "SMALL TRACHEA"  . Dysrhythmia   . GERD (gastroesophageal reflux disease)   . History of kidney stones    H/O  . OSA (obstructive sleep apnea)    USES CPAP ( rarely)  . Palpitations   . Paroxysmal atrial fibrillation [facility](HCC)     Patient Active Problem List   Diagnosis Date Noted  . Snoring 11/21/2017  . OSA (obstructive sleep apnea) 10/12/2017  . Anemia, unspecified 08/22/2017  . Panic attacks 08/22/2017  . Plantar fasciitis 08/22/2017  . SBO (small bowel obstruction) (HCC) 08/18/2017  . Small bowel  obstruction (HCC) 08/17/2017  . Obesity 08/10/2017  . Atrial fibrillation with RVR (HCC) 08/10/2017  . Chronic insomnia 05/24/2017  . Intermittent palpitations 04/06/2016  . OSA on CPAP 04/06/2016  . Postmenopausal 04/06/2016  . Anxiety 07/21/2015  . Paroxysmal atrial fibrillation (HCC) 07/21/2015  . Postmenopausal vaginal bleeding 02/27/2014  . Deficiency of other specified B group vitamins 07/21/2013    Past Surgical History:  Procedure Laterality Date  . AUGMENTATION MAMMAPLASTY  2003  . BREAST BIOPSY Right 2005   benign  . COLONOSCOPY    . DILATATION & CURETTAGE/HYSTEROSCOPY WITH MYOSURE N/A 01/02/2018   Procedure: DILATATION & CURETTAGE/HYSTEROSCOPY WITH MYOSURE, polypectomy;  Surgeon: Christeen DouglasBeasley, Bethany, MD;  Location: [facility] ORS;  Service: Gynecology;  Laterality: N/A;  . ESOPHAGOGASTRODUODENOSCOPY    . KNEE ARTHROSCOPY Left 12/02/2017   Procedure: ARTHROSCOPY KNEE WITH PARTIAL MEDIAL MENISECTOMY, REMOVAL OF LOOSE BODY;  Surgeon: Signa KellPatel, Sunny, MD;  Location: [facility] ORS;  Service: Orthopedics;  Laterality: Left;  . REDUCTION MAMMAPLASTY  2007  . TUMMY TUCK  2006     OB History    Gravida  2   Para  2   Term      Preterm      AB      Living        SAB      TAB      Ectopic      Multiple      Live  Births               Home Medications    Prior to Admission medications   Medication Sig Start Date End Date Taking? Authorizing Provider  ALPRAZolam Prudy Feeler) 0.5 MG tablet Take 0.5-1 tablets (0.25-0.5 mg total) by mouth at bedtime as needed for anxiety. 10/12/17  Yes Dohmeier, Porfirio Mylar, MD  aspirin EC 81 MG tablet Take 81 mg by mouth daily.   Yes [provider]  atenolol (TENORMIN) 50 MG tablet Take 50 mg by mouth every evening.  05/04/17  Yes [provider]  cholecalciferol (VITAMIN D3) 25 MCG (1000 UT) tablet Take 1,000 Units by mouth daily.   Yes [provider]  Cyanocobalamin (VITAMIN B-12 IJ) Inject 1,000 mcg as directed every 30  (thirty) days.   Yes [provider]  omeprazole (PRILOSEC) 20 MG capsule Take 20 mg by mouth daily as needed (heartburn).   Yes [provider]  zolpidem (AMBIEN) 5 MG tablet Take 5 mg by mouth at bedtime as needed for sleep.   Yes [provider]  acetaminophen (TYLENOL) 500 MG tablet Take 2 tablets (1,000 mg total) by mouth every 8 (eight) hours. Patient taking differently: Take 1,000 mg by mouth every 8 (eight) hours as needed for mild pain or headache.  12/02/17 12/02/18  Signa Kell, MD  apixaban (ELIQUIS) 5 MG TABS tablet Take 1 tablet (5 mg total) by mouth 2 (two) times daily for 30 days. 02/18/18 03/20/18  Jomayra Novitsky S, PA-C  HYDROcodone-acetaminophen (NORCO) 5-325 MG tablet Take 1-2 tablets by mouth every 4 (four) hours as needed for moderate pain or severe pain. Patient not taking: Reported on 02/07/2018 12/02/17   Signa Kell, MD  ibuprofen (ADVIL,MOTRIN) 800 MG tablet Take 1 tablet (800 mg total) by mouth every 8 (eight) hours as needed for moderate pain. Patient not taking: Reported on 02/07/2018 01/02/18   Christeen Douglas, MD  ondansetron (ZOFRAN ODT) 4 MG disintegrating tablet Take 1 tablet (4 mg total) by mouth every 8 (eight) hours as needed for nausea or vomiting. Patient not taking: Reported on 12/26/2017 12/02/17   Signa Kell, MD    Family History Family History  Problem Relation Age of Onset  . CAD Mother   . Heart disease Mother   . Pancreatic cancer Father     Social History Social History   Tobacco Use  . Smoking status: Never Smoker  . Smokeless tobacco: Never Used  Substance Use Topics  . Alcohol use: Yes    Frequency: Never    Comment: OCC WINE  . Drug use: Never     Allergies   Codeine and Ivp dye [iodinated diagnostic agents]   Review of Systems Review of Systems  Constitutional: Negative for fever.  HENT: Negative for ear pain and sore throat.   Eyes: Negative for visual disturbance.  Respiratory: Positive for  shortness of breath. Negative for cough.   Cardiovascular: Positive for chest pain (tightness) and palpitations. Negative for leg swelling.  Gastrointestinal: Negative for abdominal pain, constipation, diarrhea, nausea and vomiting.  Genitourinary: Negative for dysuria.  Musculoskeletal: Negative for back pain.  Skin: Negative for rash.  Neurological: Positive for dizziness and light-headedness.  All other systems reviewed and are negative.    Physical Exam Updated Vital Signs BP 107/74   Pulse (!) 59   Resp 11   Ht 5\' 5"  (1.651 m)   Wt 95.2 kg   LMP 08/09/2011 (Approximate) Comment: PT STILL SPOTS AS OF 08-08-17  SpO2 100%  BMI 34.93 kg/m   Physical Exam Vitals signs and nursing note reviewed.  Constitutional:      General: She is not in acute distress.    Appearance: She is well-developed.  HENT:     Head: Normocephalic and atraumatic.     Mouth/Throat:     Mouth: Mucous membranes are moist.  Eyes:     Conjunctiva/sclera: Conjunctivae normal.  Neck:     Musculoskeletal: Neck supple.  Cardiovascular:     Heart sounds: No murmur.     Comments: Irregularly irregular rhythm. Pulmonary:     Effort: Pulmonary effort is normal. No respiratory distress.     Breath sounds: Normal breath sounds. No stridor. No wheezing.  Abdominal:     Palpations: Abdomen is soft.     Tenderness: There is no abdominal tenderness.  Musculoskeletal:        General: No tenderness.     Right lower leg: No edema.     Left lower leg: No edema.  Skin:    General: Skin is warm and dry.  Neurological:     Mental Status: She is alert.  Psychiatric:        Mood and Affect: Mood normal.      ED Treatments / Results  Labs (all labs ordered are listed, but only abnormal results are displayed) Labs Reviewed  BASIC METABOLIC PANEL - Abnormal; Notable for the following components:      Result Value   Glucose, Bld 121 (*)    Calcium 8.8 (*)    All other components within normal limits    MAGNESIUM  CBC  TROPONIN I    EKG EKG Interpretation  Date/Time:  Saturday February 18 2018 09:52:27 EST Ventricular Rate:  151 PR Interval:    QRS Duration: 82 QT Interval:  311 QTC Calculation: 493 R Axis:   10 Text Interpretation:  Atrial fibrillation replaced nsr Borderline prolonged QT interval Baseline wander in lead(s) I II aVR aVF Otherwise no significant change Confirmed by Melene PlanFloyd, Dan [phone]([id number]) on 02/18/2018 10:04:23 AM  Repeat EKG: With normal sinus rhythm, heart rate 62, abnormal R wave progression.  No acute ST-T wave changes.   Radiology Dg Chest Portable 1 View  Result Date: 02/18/2018 CLINICAL DATA:  58 year old female with a history of dizziness and weakness EXAM: PORTABLE CHEST 1 VIEW COMPARISON:  None. FINDINGS: Cardiomediastinal silhouette within normal limits. Defibrillator pads project over the chest. EKG leads project over the chest. No pneumothorax. No pleural effusion. No confluent airspace disease. IMPRESSION: Defibrillator pads project over the chest with otherwise no evidence of acute cardiopulmonary disease. Electronically Signed   By: Gilmer MorJaime  Wagner D.O.   On: 02/18/2018 11:57    Procedures Procedures (including critical care time) CRITICAL CARE Performed by: Karrie Meresortni S Jeannine Pennisi   Total critical care time: 34 minutes  Critical care time was exclusive of separately billable procedures and treating other patients.  Critical care was necessary to treat or prevent imminent or life-threatening deterioration.  Critical care was time spent personally by me on the following activities: development of treatment plan with patient and/or surrogate as well as nursing, discussions with consultants, evaluation of patient's response to treatment, examination of patient, obtaining history from patient or surrogate, ordering and performing treatments and interventions, ordering and review of laboratory studies, ordering and review of radiographic studies, pulse oximetry  and re-evaluation of patient's condition.   Medications Ordered in ED Medications  sodium chloride 0.9 % bolus 500 mL (0 mLs Intravenous Stopped 02/18/18 1123)  etomidate (  AMIDATE) injection 8 mg (8 mg Intravenous Given 02/18/18 1152)     Initial Impression / Assessment and Plan / ED Course  I have reviewed the triage vital signs and the nursing notes.  Pertinent labs & imaging results that were available during my care of the patient were reviewed by me and considered in my medical decision making (see chart for details).     Final Clinical Impressions(s) / ED Diagnoses   Final diagnoses:  Atrial fibrillation with RVR (HCC)  Encounter for cardioversion procedure   Patient presenting with complaints that she feels like she is in A. fib.  Does have history of this.  She is not currently anticoagulated.  She took atenolol, 81 mg aspirin and was advised by her cardiology office to take dronedarone and Eliquis.  Suspected onset of symptoms is less than 48 hours.   Heart rate has been elevated in the 140s to 150s.  A. fib on the monitor.  Blood pressure has remained stable above 100 systolic.   CBC WNL BMP WNL Trop negative Mg normal CXR without acute abnormality noted.  Initialy EKG shows atrial fibrillation replaced nsr, Borderline prolonged QT interval Baseline wander in lead(s) I II aVR aVF Otherwise no significant change   Pt was consciously sedated and cardioverted with return to NSR.   Repeat EKG with normal sinus rhythm without ischemic changes.    On reeval she continues to maintain NSR on monitor. She is now alert and oriented. Discussed plan for f/u with afib clinic and plan to continue eliquis. Advised her to call clinic tomorrow to schedule appt for next week. Return precautions discussed. Pt voices understanding of the plan and reasons to return. All questions answered .    CHA2DS2/VAS Stroke Risk Points  Current as of 24 minutes ago     1 >= 2 Points: High Risk  1  - 1.99 Points: Medium Risk  0 Points: Low Risk    The patient's score has not changed in the past year.:  No Change     Details    This score determines the patient's risk of having a stroke if the  patient has atrial fibrillation.       Points Metrics  0 Has Congestive Heart Failure:  No    Current as of 24 minutes ago  0 Has Vascular Disease:  No    Current as of 24 minutes ago  0 Has Hypertension:  No    Current as of 24 minutes ago  0 Age:  21    Current as of 24 minutes ago  0 Has Diabetes:  No    Current as of 24 minutes ago  0 Had Stroke:  No  Had TIA:  No  Had thromboembolism:  No    Current as of 24 minutes ago  1 Female:  Yes    Current as of 24 minutes ago      ED Discharge Orders         Ordered    apixaban (ELIQUIS) 5 MG TABS tablet  2 times daily     02/18/18 [address], Karaline Buresh S, PA-C 02/18/18 1633    Melene Plan, DO 02/19/18 [phone]

## 2018-02-18 NOTE — ED Triage Notes (Signed)
Pt reports she woke up this morning with all the symptoms of a-fib. Reports hx of a-fib a year ago. Reports chest tightness

## 2018-02-21 ENCOUNTER — Encounter (INDEPENDENT_AMBULATORY_CARE_PROVIDER_SITE_OTHER): Payer: Self-pay | Admitting: Bariatrics

## 2018-02-21 ENCOUNTER — Ambulatory Visit (INDEPENDENT_AMBULATORY_CARE_PROVIDER_SITE_OTHER): Payer: BLUE CROSS/BLUE SHIELD | Admitting: Bariatrics

## 2018-02-21 VITALS — BP 132/76 | HR 67 | Temp 97.9°F | Ht 65.0 in | Wt 209.0 lb

## 2018-02-21 DIAGNOSIS — E559 Vitamin D deficiency, unspecified: Secondary | ICD-10-CM

## 2018-02-21 DIAGNOSIS — Z9189 Other specified personal risk factors, not elsewhere classified: Secondary | ICD-10-CM

## 2018-02-21 DIAGNOSIS — Z6834 Body mass index (BMI) 34.0-34.9, adult: Secondary | ICD-10-CM

## 2018-02-21 DIAGNOSIS — E669 Obesity, unspecified: Secondary | ICD-10-CM | POA: Diagnosis not present

## 2018-02-21 DIAGNOSIS — G4733 Obstructive sleep apnea (adult) (pediatric): Secondary | ICD-10-CM | POA: Diagnosis not present

## 2018-02-21 MED ORDER — VITAMIN D (ERGOCALCIFEROL) 1.25 MG (50000 UNIT) PO CAPS: 50000.0000 [IU] | ORAL_CAPSULE | ORAL | 0 refills | Status: DC

## 2018-02-22 ENCOUNTER — Ambulatory Visit (HOSPITAL_COMMUNITY)
Admission: RE | Admit: 2018-02-22 | Discharge: 2018-02-22 | Disposition: A | Discharge status: Home / Self Care | Payer: BLUE CROSS/BLUE SHIELD | Source: Ambulatory Visit | Attending: Nurse Practitioner | Admitting: Nurse Practitioner

## 2018-02-22 ENCOUNTER — Encounter (HOSPITAL_COMMUNITY): Payer: Self-pay | Admitting: Nurse Practitioner

## 2018-02-22 VITALS — BP 112/66 | HR 74 | Ht 65.0 in | Wt 209.0 lb

## 2018-02-22 DIAGNOSIS — Z8249 Family history of ischemic heart disease and other diseases of the circulatory system: Secondary | ICD-10-CM | POA: Insufficient documentation

## 2018-02-22 DIAGNOSIS — I480 Paroxysmal atrial fibrillation: Secondary | ICD-10-CM | POA: Diagnosis not present

## 2018-02-22 DIAGNOSIS — Z91041 Radiographic dye allergy status: Secondary | ICD-10-CM | POA: Insufficient documentation

## 2018-02-22 DIAGNOSIS — Z7901 Long term (current) use of anticoagulants: Secondary | ICD-10-CM | POA: Insufficient documentation

## 2018-02-22 DIAGNOSIS — G4733 Obstructive sleep apnea (adult) (pediatric): Secondary | ICD-10-CM | POA: Insufficient documentation

## 2018-02-22 DIAGNOSIS — Z79899 Other long term (current) drug therapy: Secondary | ICD-10-CM | POA: Insufficient documentation

## 2018-02-22 DIAGNOSIS — Z808 Family history of malignant neoplasm of other organs or systems: Secondary | ICD-10-CM | POA: Insufficient documentation

## 2018-02-22 DIAGNOSIS — Z885 Allergy status to narcotic agent status: Secondary | ICD-10-CM | POA: Insufficient documentation

## 2018-02-22 DIAGNOSIS — F419 Anxiety disorder, unspecified: Secondary | ICD-10-CM | POA: Insufficient documentation

## 2018-02-22 DIAGNOSIS — K219 Gastro-esophageal reflux disease without esophagitis: Secondary | ICD-10-CM | POA: Insufficient documentation

## 2018-02-22 DIAGNOSIS — Z87442 Personal history of urinary calculi: Secondary | ICD-10-CM | POA: Insufficient documentation

## 2018-02-22 NOTE — Progress Notes (Signed)
Office: [phone]  /  Fax: [phone]   HPI:   Chief Complaint: OBESITY Tracey Lin is here to discuss her progress with her obesity treatment plan. She is on the Category 3 plan and is following her eating plan approximately 20 % of the time. She states she is exercising 0 minutes 0 times per week. Tracey Lin has been stressed over the last few weeks and she has only been doing 20% of the plan. She was in the hospital on Saturday for atrial fibrillation and was "shocked back into rhythm". She is on Eliquis for 30 days. Her weight is 209 lb (94.8 kg) today and has had a weight loss of 1 pound over a period of 2 weeks since her last visit. She has lost 1 lb since starting treatment with [place].  Vitamin D deficiency Tracey Lin has a diagnosis of vitamin D deficiency. Her last vitamin D level was at 10.5 She is not currently taking vit D and denies nausea, vomiting or muscle weakness.  At risk for osteopenia and osteoporosis Tracey Lin is at higher risk of osteopenia and osteoporosis due to vitamin D deficiency.   Sleep Apnea Tracey Lin has a diagnosis of sleep apnea and she is wearing her CPAP intermittently.  ASSESSMENT AND PLAN:  Vitamin D deficiency - Plan: Vitamin D, Ergocalciferol, (DRISDOL) 1.25 MG (50000 UT) CAPS capsule  Obstructive sleep apnea syndrome  At risk for osteoporosis  Class 1 obesity with serious comorbidity and body mass index (BMI) of 34.0 to 34.9 in adult, unspecified obesity type  PLAN:   Vitamin D Deficiency Tracey Lin was informed that low vitamin D levels contributes to fatigue and are associated with obesity, breast, and colon cancer. She agrees to start prescription Vit D @50 ,000 IU every week #4 with no refills and will follow up for routine testing of vitamin D, at least 2-3 times per year. She was informed of the risk of over-replacement of vitamin D and agrees to not increase her dose unless she discusses this with [place] first. Tracey Lin agrees to follow up as  directed.  At risk for osteopenia and osteoporosis Tracey Lin was given extended  (15 minutes) osteoporosis prevention counseling today. Tracey Lin is at risk for osteopenia and osteoporosis due to her vitamin D deficiency. She was encouraged to take her vitamin D and follow her higher calcium diet and increase strengthening exercise to help strengthen her bones and decrease her risk of osteopenia and osteoporosis.  Sleep Apnea Tracey Lin is trying to wear her CPAP on a regular basis. She will follow up with our clinic in 2 weeks.  Obesity Tracey Lin is currently in the action stage of change. As such, her goal is to continue with weight loss efforts She has agreed to follow the Category 3 plan Tracey Lin has been instructed to work up to a goal of 150 minutes of combined cardio and strengthening exercise per week for weight loss and overall health benefits. We discussed the following Behavioral Modification Strategies today: increase H2O intake, no skipping meals, keeping healthy foods in the home, better snacking choices, planning for success, increasing lean protein intake, decreasing simple carbohydrates, increasing vegetables, decrease eating out, work on meal planning and easy cooking plans and emotional eating strategies  Tracey Lin has agreed to follow up with our clinic in 2 weeks. She was informed of the importance of frequent follow up visits to maximize her success with intensive lifestyle modifications for her multiple health conditions.  ALLERGIES: Allergies  Allergen Reactions  . Codeine Nausea And Vomiting  . Ivp  Dye [Iodinated Diagnostic Agents] Itching    MEDICATIONS: Current Outpatient Medications on File Prior to Visit  Medication Sig Dispense Refill  . acetaminophen (TYLENOL) 500 MG tablet Take 2 tablets (1,000 mg total) by mouth every 8 (eight) hours. (Patient taking differently: Take 1,000 mg by mouth every 8 (eight) hours as needed for mild pain or headache. ) 90 tablet 2  .  ALPRAZolam (XANAX) 0.5 MG tablet Take 0.5-1 tablets (0.25-0.5 mg total) by mouth at bedtime as needed for anxiety. 30 tablet 0  . apixaban (ELIQUIS) 5 MG TABS tablet Take 1 tablet (5 mg total) by mouth 2 (two) times daily for 30 days. 60 tablet 0  . atenolol (TENORMIN) 50 MG tablet Take 50 mg by mouth every evening.   3  . Cyanocobalamin (VITAMIN B-12 IJ) Inject 1,000 mcg as directed every 30 (thirty) days.    Marland Kitchen. omeprazole (PRILOSEC) 20 MG capsule Take 20 mg by mouth daily as needed (heartburn).    . zolpidem (AMBIEN) 5 MG tablet Take 5 mg by mouth at bedtime as needed for sleep.     No current facility-administered medications on file prior to visit.     PAST MEDICAL HISTORY: Past Medical History:  Diagnosis Date  . Anxiety   . B12 deficiency   . Difficult intubation    "SMALL TRACHEA"  . Dysrhythmia   . GERD (gastroesophageal reflux disease)   . History of kidney stones    H/O  . OSA (obstructive sleep apnea)    USES CPAP ( rarely)  . Palpitations   . Paroxysmal atrial fibrillation (HCC)     PAST SURGICAL HISTORY: Past Surgical History:  Procedure Laterality Date  . AUGMENTATION MAMMAPLASTY  2003  . BREAST BIOPSY Right 2005   benign  . COLONOSCOPY    . DILATATION & CURETTAGE/HYSTEROSCOPY WITH MYOSURE N/A 01/02/2018   Procedure: DILATATION & CURETTAGE/HYSTEROSCOPY WITH MYOSURE, polypectomy;  Surgeon: Christeen DouglasBeasley, Bethany, MD;  Location: [facility] ORS;  Service: Gynecology;  Laterality: N/A;  . ESOPHAGOGASTRODUODENOSCOPY    . KNEE ARTHROSCOPY Left 12/02/2017   Procedure: ARTHROSCOPY KNEE WITH PARTIAL MEDIAL MENISECTOMY, REMOVAL OF LOOSE BODY;  Surgeon: Signa KellPatel, Sunny, MD;  Location: [facility] ORS;  Service: Orthopedics;  Laterality: Left;  . REDUCTION MAMMAPLASTY  2007  . TUMMY TUCK  2006    SOCIAL HISTORY: Social History   Tobacco Use  . Smoking status: Never Smoker  . Smokeless tobacco: Never Used  Substance Use Topics  . Alcohol use: Yes    Frequency: Never    Comment: OCC WINE   . Drug use: Never    FAMILY HISTORY: Family History  Problem Relation Age of Onset  . CAD Mother   . Heart disease Mother   . Pancreatic cancer Father     ROS: Review of Systems  Constitutional: Positive for weight loss.  Gastrointestinal: Negative for nausea and vomiting.  Musculoskeletal:       Negative for muscle weakness    PHYSICAL EXAM: Blood pressure 132/76, pulse 67, temperature 97.9 F (36.6 C), temperature source Oral, height 5\' 5"  (1.651 m), weight 209 lb (94.8 kg), last menstrual period 08/09/2011, SpO2 96 %. Body mass index is 34.78 kg/m. Physical Exam Vitals signs reviewed.  Constitutional:      Appearance: Normal appearance. She is well-developed. She is obese.  Cardiovascular:     Rate and Rhythm: Normal rate.  Pulmonary:     Effort: Pulmonary effort is normal.  Musculoskeletal: Normal range of motion.  Skin:    General: Skin is  warm and dry.  Neurological:     Mental Status: She is alert and oriented to person, place, and time.  Psychiatric:        Mood and Affect: Mood normal.        Behavior: Behavior normal.     RECENT LABS AND TESTS: BMET    Component Value Date/Time   NA 139 02/18/2018 0958   NA 141 02/07/2018 1029   K 4.0 02/18/2018 0958   CL 106 02/18/2018 0958   CO2 25 02/18/2018 0958   GLUCOSE 121 (H) 02/18/2018 0958   BUN 13 02/18/2018 0958   BUN 11 02/07/2018 1029   CREATININE 0.71 02/18/2018 0958   CALCIUM 8.8 (L) 02/18/2018 0958   GFRNONAA >60 02/18/2018 0958   GFRAA >60 02/18/2018 0958   Lab Results  Component Value Date   HGBA1C 5.2 02/07/2018   Lab Results  Component Value Date   INSULIN 9.3 02/07/2018   CBC    Component Value Date/Time   WBC 8.1 02/18/2018 0958   RBC 4.69 02/18/2018 0958   HGB 13.6 02/18/2018 0958   HCT 44.0 02/18/2018 0958   PLT 355 02/18/2018 0958   MCV 93.8 02/18/2018 0958   MCH 29.0 02/18/2018 0958   MCHC 30.9 02/18/2018 0958   RDW 12.7 02/18/2018 0958   LYMPHSABS 2.5 08/19/2017  0549   MONOABS 0.4 08/19/2017 0549   EOSABS 0.2 08/19/2017 0549   BASOSABS 0.0 08/19/2017 0549   Iron/TIBC/Ferritin/ %Sat No results found for: IRON, TIBC, FERRITIN, IRONPCTSAT Lipid Panel     Component Value Date/Time   CHOL 220 (H) 02/07/2018 1029   TRIG 123 02/07/2018 1029   HDL 59 02/07/2018 1029   LDLCALC 136 (H) 02/07/2018 1029   Hepatic Function Panel     Component Value Date/Time   PROT 6.7 02/07/2018 1029   ALBUMIN 4.4 02/07/2018 1029   AST 21 02/07/2018 1029   ALT 25 02/07/2018 1029   ALKPHOS 98 02/07/2018 1029   BILITOT 0.3 02/07/2018 1029      Component Value Date/Time   TSH 2.350 02/07/2018 1029     Ref. Range 02/07/2018 10:29  Vitamin D, 25-Hydroxy Latest Ref Range: 30.0 - 100.0 ng/mL 10.5 (L)     OBESITY BEHAVIORAL INTERVENTION VISIT  Today's visit was # 2   Starting weight: 210 lbs Starting date: 02/07/2018 Today's weight : 209 lbs  Today's date: 02/21/2018 Total lbs lost to date: 1   ASK: We discussed the diagnosis of obesity with Tracey Lin today and Tracey Lin agreed to give us permission to discuss obesity behavioral modification therapy today.  ASSESS: Tracey Lin has the diagnosis of obesity and her BMI today is 34.78 Tracey Lin is in the action stage of change   ADVISE: Tracey Lin was educated on the multiple health risks of obesity as well as the benefit of weight loss to improve her health. She was advised of the need for long term treatment and the importance of lifestyle modifications to improve her current health and to decrease her risk of future health problems.  AGREE: Multiple dietary modification options and treatment options were discussed and  Tracey Lin agreed to follow the recommendations documented in the above note.  ARRANGE: Tracey Lin was educated on the importance of frequent visits to treat obesity as outlined per CMS and USPSTF guidelines and agreed to schedule her next follow up appointment today.  Cristi LoronI, Joanne Murray, am acting  as Energy managertranscriptionist for [employer] Corporation[employer]el A. Manson PasseyBrown, DO  I have reviewed the above documentation for accuracy and  completeness, and I agree with the above. -Jearld Lesch, DO

## 2018-02-22 NOTE — Progress Notes (Signed)
Primary Care Physician: Leotis Shames, MD Referring Physician:  F/u hospitalization  [facility]     Tracey Lin is a 58 y.o. female with a h/o paroxysmal afib, sleep apnea, treated with CPAP that is in the afib clinic for f/u hospitalization for afib while in [place], [place] to attend a[place]. She was walking to her car and  was aware of increased HR, dizziness and lightheadedness and was taken to [facility].  She had been out in the sun for about 30 mins and her fluid intake had been less than usual  for the day. Her HR was 170 bpm. She was thought to have mild dehydration.She was admitted, placed on Cardizem drip for a couple of days then started on Multaq, ranexa, atenolol, eliquis and did convert to SR prior to discharge. The MD told pt that she would need quick f/u as multaq was for short term as it could cause liver failure. She is planning to see Dr. Johney Frame soon. She would like to discuss coming off drugs and a possible ablation down the line. CHA2DS2VASc score is 1 for female. She would like to get clearance to have her knee surgery as she wants this first so she can get back to exercising to obtain wight loss. Echo was done at [facility] and with normal results( all records can be found in Care Everywhere).   She has had afib for many years but it had been quiet, treated in the [place] area until she moved to [place] 6 years ago. She saw Dr. Darrold Junker a couple of times but has not seen him since 2015.  She was suppose to have knee surgery last week but it was cancelled.  She states that she has gained  40 lbs over the last 6 years. She does use cpap, but still sleeps very poorly. Feels that she does get at least 4 hours a night use.  F/u in afib clinic 9/9. When she saw Dr. Johney Frame, he advised her to start weaning off the ranexa, multaq and DOAC which she has successfully done. She  feels well. No further afib. She did have a SBO which self released right after I  saw her last in clinic. No further reoccurrence.   F/u in afib clinic 02/22/18, as she had a ER visit for afib and went to the ER for a cardioversion. She had multaq and eliquis on hand and took these but did not help to restore SR.  The cardioversion was successful and she is in SR today. She still would prefer to stay off daily meds . She has talked to Dr. Johney Frame about an ablation in the past but is still not quite ready for this either unless afib burden increases. She is seeing a weight loss specialist. She has not used her cpap for sleep apnea in some time, cannot tolerate mask. She thinks the trigger was lack of sleep and working long hours.   Today, she denies symptoms of palpitations, chest pain, shortness of breath, orthopnea, PND, lower extremity edema, dizziness, presyncope, syncope, or neurologic sequela. The patient is tolerating medications without difficulties and is otherwise without complaint today.   Past Medical History:  Diagnosis Date  . Anxiety   . B12 deficiency   . Difficult intubation    "SMALL TRACHEA"  . Dysrhythmia   . GERD (gastroesophageal reflux disease)   . History of kidney stones    H/O  . OSA (obstructive sleep apnea)    USES CPAP (  rarely)  . Palpitations   . Paroxysmal atrial fibrillation [facility])    Past Surgical History:  Procedure Laterality Date  . AUGMENTATION MAMMAPLASTY  2003  . BREAST BIOPSY Right 2005   benign  . COLONOSCOPY    . DILATATION & CURETTAGE/HYSTEROSCOPY WITH MYOSURE N/A 01/02/2018   Procedure: DILATATION & CURETTAGE/HYSTEROSCOPY WITH MYOSURE, polypectomy;  Surgeon: Christeen Douglas, MD;  Location: [facility] ORS;  Service: Gynecology;  Laterality: N/A;  . ESOPHAGOGASTRODUODENOSCOPY    . KNEE ARTHROSCOPY Left 12/02/2017   Procedure: ARTHROSCOPY KNEE WITH PARTIAL MEDIAL MENISECTOMY, REMOVAL OF LOOSE BODY;  Surgeon: Signa Kell, MD;  Location: [facility] ORS;  Service: Orthopedics;  Laterality: Left;  . REDUCTION MAMMAPLASTY  2007  . TUMMY TUCK   2006    Current Outpatient Medications  Medication Sig Dispense Refill  . acetaminophen (TYLENOL) 500 MG tablet Take 2 tablets (1,000 mg total) by mouth every 8 (eight) hours. (Patient taking differently: Take 1,000 mg by mouth every 8 (eight) hours as needed for mild pain or headache. ) 90 tablet 2  . ALPRAZolam (XANAX) 0.5 MG tablet Take 0.5-1 tablets (0.25-0.5 mg total) by mouth at bedtime as needed for anxiety. 30 tablet 0  . apixaban (ELIQUIS) 5 MG TABS tablet Take 1 tablet (5 mg total) by mouth 2 (two) times daily for 30 days. 60 tablet 0  . atenolol (TENORMIN) 50 MG tablet Take 50 mg by mouth every evening.   3  . Cyanocobalamin (VITAMIN B-12 IJ) Inject 1,000 mcg as directed every 30 (thirty) days.    Marland Kitchen omeprazole (PRILOSEC) 20 MG capsule Take 20 mg by mouth daily as needed (heartburn).    . Vitamin D, Ergocalciferol, (DRISDOL) 1.25 MG (50000 UT) CAPS capsule Take 1 capsule (50,000 Units total) by mouth every 7 (seven) days. 4 capsule 0  . zolpidem (AMBIEN) 5 MG tablet Take 5 mg by mouth at bedtime as needed for sleep.     No current facility-administered medications for this encounter.     Allergies  Allergen Reactions  . Codeine Nausea And Vomiting  . Ivp Dye [Iodinated Diagnostic Agents] Itching    Social History   Socioeconomic History  . Marital status: Divorced    Spouse name: Not on file  . Number of children: Not on file  . Years of education: Not on file  . Highest education level: Not on file  Occupational History  . Occupation: Veterinary surgeon  Social Needs  . Financial resource strain: Not on file  . Food insecurity:    Worry: Not on file    Inability: Not on file  . Transportation needs:    Medical: Not on file    Non-medical: Not on file  Tobacco Use  . Smoking status: Never Smoker  . Smokeless tobacco: Never Used  Substance and Sexual Activity  . Alcohol use: Yes    Frequency: Never    Comment: OCC WINE  . Drug use: Never  . Sexual activity: Not on file    Lifestyle  . Physical activity:    Days per week: Not on file    Minutes per session: Not on file  . Stress: Not on file  Relationships  . Social connections:    Talks on phone: Not on file    Gets together: Not on file    Attends religious service: Not on file    Active member of club or organization: Not on file    Attends meetings of clubs or organizations: Not on file    Relationship status:  Not on file  . Intimate partner violence:    Fear of current or ex partner: Not on file    Emotionally abused: Not on file    Physically abused: Not on file    Forced sexual activity: Not on file  Other Topics Concern  . Not on file  Social History Narrative   [place]    Works as a Community education officerrealtor   President of [employer]    Family History  Problem Relation Age of Onset  . CAD Mother   . Heart disease Mother   . Pancreatic cancer Father     ROS- All systems are reviewed and negative except as per the HPI above  Physical Exam: Vitals:   02/22/18 1118  BP: 112/66  Pulse: 74  Weight: 94.8 kg  Height: 5\' 5"  (1.651 m)   Wt Readings from Last 3 Encounters:  02/22/18 94.8 kg  02/21/18 94.8 kg  02/18/18 95.2 kg    Labs: Lab Results  Component Value Date   NA 139 02/18/2018   K 4.0 02/18/2018   CL 106 02/18/2018   CO2 25 02/18/2018   GLUCOSE 121 (H) 02/18/2018   BUN 13 02/18/2018   CREATININE 0.71 02/18/2018   CALCIUM 8.8 (L) 02/18/2018   MG 2.0 02/18/2018   Lab Results  Component Value Date   INR 1.13 08/17/2017   Lab Results  Component Value Date   CHOL 220 (H) 02/07/2018   HDL 59 02/07/2018   LDLCALC 136 (H) 02/07/2018   TRIG 123 02/07/2018     GEN- The patient is well appearing, alert and oriented x 3 today.   Head- normocephalic, atraumatic Eyes-  Sclera clear, conjunctiva pink Ears- hearing intact Oropharynx- clear Neck- supple, no JVP Lymph- no cervical lymphadenopathy Lungs- Clear to ausculation bilaterally, normal work  of breathing Heart- Regular rate and rhythm, no murmurs, rubs or gallops, PMI not laterally displaced GI- soft, NT, ND, + BS Extremities- no clubbing, cyanosis, or edema MS- no significant deformity or atrophy Skin- no rash or lesion Psych- euthymic mood, full affect Neuro- strength and sensation are intact  EKG-NSR at 74  bpm, pr int 124 ms, qrs int 80 ms, qtc 444 ms Epic records reviewed Care Everywhere    Assessment and Plan: 1. Paroxysmal afib Recent successful ER cardioversion She would prefer not to take daily Multaq She is not ready for ablation unless ERAF Afib triggers reviewed  2. Chadsvasc score of 1 Now back on eliquis for 4 weeks following cardioversion  4. Lifestyle issues Encouraged to continue CPAP, she states that she can not tolerate the mask I gave her Dr. Althea GrimmerMark Katz, Oral surgeion's name, to see if a mouth appliance might help her Continue  efforts with weight management   F/u with afib clinic as needed  Lupita LeashDonna C. Matthew Folksarroll, ANP-C [facility] [place] [address] [place], [place] [address] [phone]

## 2018-02-27 ENCOUNTER — Telehealth: Payer: Self-pay | Admitting: Internal Medicine

## 2018-02-27 NOTE — Telephone Encounter (Signed)
Looks like primary is Dr. Johney Frame;

## 2018-02-27 NOTE — Telephone Encounter (Signed)
Patient is calling stating she can't afford Eliquis. She is wondering if there is something else that can be prescribed to her, or if there is a coupon for it.

## 2018-02-27 NOTE — Telephone Encounter (Signed)
Call returned to Pt.  Advised would leave 3 weeks of samples for Pt to complete 4 weeks on anticoagulation s/p DCCV.  Pt indicates understanding.  Thanked nurse.

## 2018-03-08 ENCOUNTER — Encounter (INDEPENDENT_AMBULATORY_CARE_PROVIDER_SITE_OTHER): Payer: Self-pay

## 2018-03-08 ENCOUNTER — Ambulatory Visit (INDEPENDENT_AMBULATORY_CARE_PROVIDER_SITE_OTHER): Payer: BLUE CROSS/BLUE SHIELD | Admitting: Bariatrics

## 2018-03-10 ENCOUNTER — Telehealth: Payer: Self-pay | Admitting: Neurology

## 2018-03-10 NOTE — Telephone Encounter (Signed)
Pt states she called on last week and was told a message would be sent for her to be called about discussing a re evaluation of her CPAP settings.  Pt was apologized to for there being no record of such a note.  Pt states she is unable to sleep with or even better without the CPAP and its current settings.  Pt is asking for a call to discuss her issues with the CPAP and what she was told by someone what would be done re: verification thru her insurance.  Please call.

## 2018-03-10 NOTE — Telephone Encounter (Signed)
I spoke with the patient and she stated that she is sleeping awful with and without the CPAP. She also said she was told previously that she would need to come back to the office and stay overnight so we can check her settings. I advised the patient that I did see a letter sent by Meagan in the sleep lab from December 2019. I advised pt that I would send a request to the sleep lab to have them call her back, likely Monday as our office is closed at this time. She verbalized appreciation.

## 2018-03-11 ENCOUNTER — Other Ambulatory Visit (INDEPENDENT_AMBULATORY_CARE_PROVIDER_SITE_OTHER): Payer: Self-pay | Admitting: Bariatrics

## 2018-03-11 DIAGNOSIS — E559 Vitamin D deficiency, unspecified: Secondary | ICD-10-CM

## 2018-03-14 NOTE — Telephone Encounter (Signed)
Pt is scheduled is for a CPAP study on March 23 at 8pm

## 2018-03-18 ENCOUNTER — Ambulatory Visit (INDEPENDENT_AMBULATORY_CARE_PROVIDER_SITE_OTHER): Payer: BLUE CROSS/BLUE SHIELD | Admitting: Neurology

## 2018-03-18 DIAGNOSIS — G4761 Periodic limb movement disorder: Secondary | ICD-10-CM

## 2018-03-18 DIAGNOSIS — G4733 Obstructive sleep apnea (adult) (pediatric): Secondary | ICD-10-CM | POA: Diagnosis not present

## 2018-03-18 DIAGNOSIS — I480 Paroxysmal atrial fibrillation: Secondary | ICD-10-CM

## 2018-03-18 DIAGNOSIS — R0683 Snoring: Secondary | ICD-10-CM

## 2018-03-24 NOTE — Addendum Note (Signed)
Addended by: Melvyn Novas on: 03/24/2018 03:21 PM   Modules accepted: Orders

## 2018-03-24 NOTE — Procedures (Signed)
Tracey Lin, Hardey DOB:      09/16/1960      MR#:    [id number]     DATE OF RECORDING: 03/18/2018 REFERRING M.D.:  Hillis Range, MD Study Performed:   Titration to Positive Airway Pressure  HISTORY:    58 year old Caucasian female patient returns after her baseline study from 11-16-2017 which resulted in: Mild Obstructive Sleep Apnea (OSA) at AHI 8.1/h. with loud snoring at RDI 17/h.  REM AHI at 28.8/h made this mild apnea less likely responsive to dental device treatment. Minor 02 desaturation, clinically not significant. Severe Periodic Limb Movement Disorder (PLMD) in the baseline study.Primary Snoring  Followed by Dr. Johney Frame for atrial fibrillation.  She was diagnosed first time about 15 years ago after she presented with palpitations and had been very fatigued. At the time she was also referred for a PSG.  She had been prescribed CPAP but has discontinued CPAP use a while back because she did not feel any better using it. The patient actually received a new CPAP and she has hated it ever since.  She wants to discuss alternatives to CPAP treatment. She considers herself claustrophobic. Patient has been told that she is difficult to intubate, has a small airway, small trachea.  Atrial Fib, Anxiety, GERD, H/O Kidney stones, OSA The patient endorsed the Epworth Sleepiness Scale at only 2 points.   The patient's weight 216 pounds with a height of 60 (inches), resulting in a BMI of 42.4 kg/m2. The patient's neck circumference measured 15 inches.  CURRENT MEDICATIONS: Aspirin, Tenormin, Tranxene, Ambien  PROCEDURE:  This is a multichannel digital polysomnogram utilizing the SomnoStar 11.2 system.  Electrodes and sensors were applied and monitored per AASM Specifications.   EEG, EOG, Chin and Limb EMG, were sampled at 200 Hz.  ECG, Snore and Nasal Pressure, Thermal Airflow, Respiratory Effort, CPAP Flow and Pressure, Oximetry was sampled at 50 Hz. Digital video and audio were recorded.      CPAP was  initiated with an AirFit p 10 mask, but patient preferred to change to a nasal mask N 20 from her home.   Started at 5 cmH20 with heated humidity per AASM split night standards and pressure was advanced to 0cmH20 because of hypopneas, apneas and desaturations.  At a PAP pressure of 10 cmH20, there was a reduction of the AHI to 0.8 with improvement of sleep apnea. At 11 cm there was an AHI of 0.0/h.   Lights Out was at 21:29 and Lights On at 05:00. Total recording time (TRT) was 451.5 minutes, with a total sleep time (TST) of 344 minutes. The patient's sleep latency was 77 minutes. REM latency was 89.5 minutes.  The sleep efficiency was 76.2 %.    SLEEP ARCHITECTURE: WASO (Wake after sleep onset) was 78 minutes.  There were 32 minutes in Stage N1, 173.5 minutes Stage N2, 55.5 minutes Stage N3 and 83 minutes in Stage REM.  The percentage of Stage N1 was 9.3%, Stage N2 was 50.4%, Stage N3 was 16.1% and Stage R (REM sleep) was 24.1%. The sleep architecture was notable for severe fragmentation.   RESPIRATORY ANALYSIS:  There was a total of 17 respiratory events: There were 17 hypopneas with a hypopnea index of 3./hour.  The total APNEA/HYPOPNEA INDEX (AHI) was 3.0 /hour and the total RESPIRATORY DISTURBANCE INDEX was 3.0/hour. 4 events occurred in REM sleep and 13 events in NREM. The REM AHI was 2.9 /hour versus a non-REM AHI of 3. /hour.  The patient spent 51 minutes of  total sleep time in the supine position and 293 minutes in non-supine. The supine AHI was 5.9, versus a non-supine AHI of 2.5.  OXYGEN SATURATION & C02:  The baseline 02 saturation was 95%, with the lowest being 91%. Time spent below 89% saturation equaled 0 minutes.  AROUSALS:  The arousals were noted as: 30 were spontaneous, 0 were associated with PLMs, and 11 were associated with respiratory events. The patient had a total of 6 Periodic Limb Movements. The Periodic Limb Movement (PLM) index was 1.0/h and the PLM Arousal index was 0 /hour.  Audio and video analysis did not show any abnormal or unusual movements, behaviors, phonations or vocalizations. The patient took bathroom breaks. EKG was in keeping with normal sinus rhythm (NSR) and isolated PVCs. I did not find any evidence of atrial fibrillation pattern dut ring this night's study.  DIAGNOSIS 1. 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   2. The previously severe Periodic Limb Movement Disorder has resolved.   3. EKG in NSR with isolated PVCs, PACs.   PLANS/RECOMMENDATIONS: 1. Follow-up with referring physician and educate patient in best sleep hygiene practice.   2. Achieve and maintain lean body weight. 3. Avoid sedatives, hypnotics, and alcoholic beverage consumption as it increases apnea, snoring and risk of atrial fibrillation. 4. CPAP therapy compliance is defined as 4 hours or more of nightly use.    DISCUSSION: order for an Autotitration -capable -CPAP machine with heated humidity, set between 7 and 14 cm water with 3 cm EPR. Mask of patients choice.   A follow up appointment will be scheduled in the Sleep Clinic at [facility].   Please call [phone] with any questions.      I certify that I have reviewed the entire raw data recording prior to the issuance of this report in accordance with the Standards of Accreditation of the American Academy of Sleep Medicine (AASM)   Melvyn Novas, M.D. 03-24-2018 Diplomat, American Board of Psychiatry and Neurology  Diplomat, American Board of Sleep Medicine

## 2018-03-27 ENCOUNTER — Telehealth: Payer: Self-pay

## 2018-03-27 ENCOUNTER — Telehealth: Payer: Self-pay | Admitting: Neurology

## 2018-03-27 NOTE — Telephone Encounter (Signed)
Called the patient and reviewed her CPAP titration study and pressures. Patient is already established with [facility] and has a machine and would like to have the current machine pressures adjusted to Dr Oliva Bustard order. I will send the order to [facility] to adjust the pressure setting Auto CPAP 7-14 cm water pressure. Pt verbalized understanding. Advised the patient we should see her in 90 days at least to make sure the current pressure is working well and she is being compliant. I reviewed compliant guidelines with the patient in order to get her supplies covered. Pt verbalized understanding.

## 2018-03-27 NOTE — Telephone Encounter (Signed)
I called patient to give her Dr.Dohmeier recommendations listed and titration results.Pt verbalized understanding. ------

## 2018-06-13 ENCOUNTER — Telehealth: Payer: Self-pay

## 2018-06-13 NOTE — Telephone Encounter (Signed)
Unable to get in contact with the patient to convert their office visit with Megan on 06/21/2018 into a doxy.me visit. I left a voicemail asking the patient to return my call. Office number was provided.   If patient calls back please convert their office visit into a doxy.me visit.   

## 2018-06-20 NOTE — Telephone Encounter (Signed)
LMVM for pt to return call converting appt tomorrow to VV doxy.me.

## 2018-06-21 ENCOUNTER — Ambulatory Visit: Payer: Self-pay | Admitting: Adult Health

## 2018-06-21 NOTE — Telephone Encounter (Signed)
I called pt, had to leave another message. I relayed will cancel appt and to please call back to reschedule at her convenience.

## 2018-07-07 ENCOUNTER — Other Ambulatory Visit: Payer: Self-pay | Admitting: Internal Medicine

## 2018-07-07 MED ORDER — ATENOLOL 50 MG PO TABS: 50.0000 mg | ORAL_TABLET | Freq: Every evening | ORAL | 0 refills | Status: DC

## 2018-07-07 NOTE — Telephone Encounter (Signed)
Pt's medication was sent to pt's pharmacy as requested. Confirmation received.  °

## 2018-07-11 ENCOUNTER — Other Ambulatory Visit: Payer: Self-pay

## 2018-07-11 ENCOUNTER — Encounter (HOSPITAL_COMMUNITY): Payer: Self-pay | Admitting: Physician Assistant

## 2018-07-11 ENCOUNTER — Emergency Department (HOSPITAL_COMMUNITY)
Admission: EM | Admit: 2018-07-11 | Discharge: 2018-07-11 | Disposition: A | Discharge status: Home / Self Care | Payer: BLUE CROSS/BLUE SHIELD | Attending: Emergency Medicine | Admitting: Emergency Medicine

## 2018-07-11 ENCOUNTER — Ambulatory Visit (HOSPITAL_BASED_OUTPATIENT_CLINIC_OR_DEPARTMENT_OTHER)
Admission: RE | Admit: 2018-07-11 | Discharge: 2018-07-11 | Disposition: A | Discharge status: Home / Self Care | Payer: BLUE CROSS/BLUE SHIELD | Source: Ambulatory Visit | Attending: Physician Assistant | Admitting: Physician Assistant

## 2018-07-11 VITALS — BP 118/80 | HR 167 | Ht 65.0 in | Wt 221.4 lb

## 2018-07-11 DIAGNOSIS — I4891 Unspecified atrial fibrillation: Secondary | ICD-10-CM | POA: Insufficient documentation

## 2018-07-11 DIAGNOSIS — Z7982 Long term (current) use of aspirin: Secondary | ICD-10-CM | POA: Diagnosis not present

## 2018-07-11 DIAGNOSIS — R0789 Other chest pain: Secondary | ICD-10-CM | POA: Diagnosis present

## 2018-07-11 DIAGNOSIS — Z87442 Personal history of urinary calculi: Secondary | ICD-10-CM | POA: Insufficient documentation

## 2018-07-11 DIAGNOSIS — Z8249 Family history of ischemic heart disease and other diseases of the circulatory system: Secondary | ICD-10-CM | POA: Insufficient documentation

## 2018-07-11 DIAGNOSIS — Z79899 Other long term (current) drug therapy: Secondary | ICD-10-CM | POA: Insufficient documentation

## 2018-07-11 DIAGNOSIS — Z6836 Body mass index (BMI) 36.0-36.9, adult: Secondary | ICD-10-CM | POA: Insufficient documentation

## 2018-07-11 DIAGNOSIS — G4733 Obstructive sleep apnea (adult) (pediatric): Secondary | ICD-10-CM | POA: Insufficient documentation

## 2018-07-11 DIAGNOSIS — Z885 Allergy status to narcotic agent status: Secondary | ICD-10-CM | POA: Insufficient documentation

## 2018-07-11 DIAGNOSIS — E538 Deficiency of other specified B group vitamins: Secondary | ICD-10-CM | POA: Insufficient documentation

## 2018-07-11 DIAGNOSIS — F419 Anxiety disorder, unspecified: Secondary | ICD-10-CM | POA: Insufficient documentation

## 2018-07-11 DIAGNOSIS — K219 Gastro-esophageal reflux disease without esophagitis: Secondary | ICD-10-CM | POA: Insufficient documentation

## 2018-07-11 DIAGNOSIS — Z800 Family history of malignant neoplasm of digestive organs: Secondary | ICD-10-CM | POA: Insufficient documentation

## 2018-07-11 DIAGNOSIS — I480 Paroxysmal atrial fibrillation: Secondary | ICD-10-CM | POA: Insufficient documentation

## 2018-07-11 DIAGNOSIS — E669 Obesity, unspecified: Secondary | ICD-10-CM | POA: Insufficient documentation

## 2018-07-11 DIAGNOSIS — Z91041 Radiographic dye allergy status: Secondary | ICD-10-CM | POA: Insufficient documentation

## 2018-07-11 LAB — BASIC METABOLIC PANEL
Anion gap: 6 (ref 5–15)
BUN: 14 mg/dL (ref 6–20)
CO2: 27 mmol/L (ref 22–32)
Calcium: 9 mg/dL (ref 8.9–10.3)
Chloride: 108 mmol/L (ref 98–111)
Creatinine, Ser: 0.81 mg/dL (ref 0.44–1.00)
GFR calc Af Amer: >60 mL/min (ref >60)
GFR calc non Af Amer: >60 mL/min (ref >60)
Glucose, Bld: 109 mg/dL — ABNORMAL HIGH (ref 70–99)
Potassium: 4.1 mmol/L (ref 3.5–5.1)
Sodium: 141 mmol/L (ref 135–145)

## 2018-07-11 LAB — CBC
HCT: 41.4 % (ref 36.0–46.0)
Hemoglobin: 13.2 g/dL (ref 12.0–15.0)
MCH: 29.7 pg (ref 26.0–34.0)
MCHC: 31.9 g/dL (ref 30.0–36.0)
MCV: 93.2 fL (ref 80.0–100.0)
Platelets: 367 10*3/uL (ref 150–400)
RBC: 4.44 MIL/uL (ref 3.87–5.11)
RDW: 13 % (ref 11.5–15.5)
WBC: 9.2 10*3/uL (ref 4.0–10.5)
nRBC: 0 % (ref 0.0–0.2)

## 2018-07-11 MED ORDER — APIXABAN 5 MG PO TABS: 5.0000 mg | ORAL_TABLET | Freq: Two times a day (BID) | ORAL | 0 refills | Status: DC

## 2018-07-11 MED ORDER — DRONEDARONE HCL 400 MG PO TABS: 400.0000 mg | ORAL_TABLET | Freq: Two times a day (BID) | ORAL | 0 refills | Status: DC

## 2018-07-11 MED ORDER — ETOMIDATE 2 MG/ML IV SOLN
INTRAVENOUS | Status: AC | PRN
  Administered 2018-07-11: 5 mg via INTRAVENOUS

## 2018-07-11 MED ORDER — SODIUM CHLORIDE 0.9 % IV BOLUS
500.0000 mL | Freq: Once | INTRAVENOUS | Status: AC
  Administered 2018-07-11: 500 mL via INTRAVENOUS

## 2018-07-11 MED ORDER — ETOMIDATE 2 MG/ML IV SOLN
5.0000 mg | Freq: Once | INTRAVENOUS | Status: DC
  Filled 2018-07-11: qty 10

## 2018-07-11 NOTE — Discharge Instructions (Addendum)
Follow-up with the A. fib clinic for further guidance.

## 2018-07-11 NOTE — ED Notes (Signed)
Patient verbalizes understanding of discharge instructions. Opportunity for questioning and answers were provided. Armband removed by staff, pt discharged from ED via wheelchair to home with familly.

## 2018-07-11 NOTE — ED Notes (Signed)
Informed consent signed by pt and this RN. Consent at bedside

## 2018-07-11 NOTE — Progress Notes (Signed)
Primary Care Physician: Glendon Axe, MD Referring Physician:  F/u hospitalization  [facility] hospital   Primary EP: Dr [name]  Beaulah Lin is a 58 y.o. female with a h/o paroxysmal afib, sleep apnea, treated with CPAP that is in the afib clinic for f/u hospitalization for afib while in [place], [place] to attend a[place] rally. She was walking to her car and  was aware of increased HR, dizziness and lightheadedness and was taken to [facility].  She had been out in the sun for about 30 mins and her fluid intake had been less than usual  for the day. Her HR was 170 bpm. She was thought to have mild dehydration.She was admitted, placed on Cardizem drip for a couple of days then started on Multaq, ranexa, atenolol, eliquis and did convert to SR prior to discharge. The MD told pt that she would need quick f/u as multaq was for short term as it could cause liver failure. She is planning to see Dr. [name] soon. She would like to discuss coming off drugs and a possible ablation down the line. CHA2DS2VASc score is 1 for female. She would like to get clearance to have her knee surgery as she wants this first so she can get back to exercising to obtain wight loss. Echo was done at [facility] and with normal results( all records can be found in [place]).   She has had afib for many years but it had been quiet, treated in the [place] area until she moved to [place] 6 years ago. She saw Dr. [name] a couple of times but has not seen him since 2015.  She was suppose to have knee surgery last week but it was cancelled.  She states that she has gained  40 lbs over the last 6 years. She does use cpap, but still sleeps very poorly. Feels that she does get at least 4 hours a night use.  F/u in afib clinic 9/9. When she saw Dr. [name], he advised her to start weaning off the ranexa, multaq and DOAC which she has successfully done. She  feels well. No further afib. She did have a SBO which self  released right after I saw her last in clinic. No further reoccurrence.   F/u in afib clinic 02/22/18, as she had a ER visit for afib and went to the ER for a cardioversion. She had multaq and eliquis on hand and took these but did not help to restore SR.  The cardioversion was successful and she is in SR today. She still would prefer to stay off daily meds . She has talked to Dr. [name] about an ablation in the past but is still not quite ready for this either unless afib burden increases. She is seeing a weight loss specialist. She has not used her cpap for sleep apnea in some time, cannot tolerate mask. She thinks the trigger was lack of sleep and working long hours.   F/u in AF clinic 07/11/18. Patient reports that last night she began having feelings of SOB, weakness, and heart racing. In office today she is in afib with RVR and is very SOB, especially when walking. She has not passed out but has also been very lightheaded. She is not on Eliquis or Multaq at this time.   Today, she denies symptoms of chest pain, orthopnea, PND, lower extremity edema, presyncope, syncope, or neurologic sequela. The patient is tolerating medications without difficulties and is otherwise without complaint today.  Past Medical History:  Diagnosis Date   Anxiety    B12 deficiency    Difficult intubation    "SMALL TRACHEA"   Dysrhythmia    GERD (gastroesophageal reflux disease)    History of kidney stones    H/O   OSA (obstructive sleep apnea)    USES CPAP ( rarely)   Palpitations    Paroxysmal atrial fibrillation (HCC)    Past Surgical History:  Procedure Laterality Date   AUGMENTATION MAMMAPLASTY  2003   BREAST BIOPSY Right 2005   benign   COLONOSCOPY     DILATATION & CURETTAGE/HYSTEROSCOPY WITH MYOSURE N/A 01/02/2018   Procedure: DILATATION & CURETTAGE/HYSTEROSCOPY WITH MYOSURE, polypectomy;  Surgeon: Christeen DouglasBeasley, Bethany, MD;  Location: [facility] ORS;  Service: Gynecology;  Laterality: N/A;    ESOPHAGOGASTRODUODENOSCOPY     KNEE ARTHROSCOPY Left 12/02/2017   Procedure: ARTHROSCOPY KNEE WITH PARTIAL MEDIAL MENISECTOMY, REMOVAL OF LOOSE BODY;  Surgeon: Signa KellPatel, Sunny, MD;  Location: [facility] ORS;  Service: Orthopedics;  Laterality: Left;   REDUCTION MAMMAPLASTY  2007   TUMMY TUCK  2006    Current Outpatient Medications  Medication Sig Dispense Refill   acetaminophen (TYLENOL) 500 MG tablet Take 2 tablets (1,000 mg total) by mouth every 8 (eight) hours. (Patient taking differently: Take 1,000 mg by mouth every 8 (eight) hours as needed for mild pain or headache. ) 90 tablet 2   ALPRAZolam (XANAX) 0.5 MG tablet Take 0.5-1 tablets (0.25-0.5 mg total) by mouth at bedtime as needed for anxiety. 30 tablet 0   atenolol (TENORMIN) 50 MG tablet Take 1 tablet (50 mg total) by mouth every evening. Please make yearly appt with Dr. Johney FrameAllred for July for future refills. 1st attempt 90 tablet 0   Cyanocobalamin (VITAMIN B-12 IJ) Inject 1,000 mcg as directed every 30 (thirty) days.     omeprazole (PRILOSEC) 20 MG capsule Take 20 mg by mouth daily as needed (heartburn).     Vitamin D, Ergocalciferol, (DRISDOL) 1.25 MG (50000 UT) CAPS capsule Take 1 capsule (50,000 Units total) by mouth every 7 (seven) days. 4 capsule 0   zolpidem (AMBIEN) 5 MG tablet Take 5 mg by mouth at bedtime as needed for sleep.     No current facility-administered medications for this encounter.     Allergies  Allergen Reactions   Codeine Nausea And Vomiting   Ivp Dye [Iodinated Diagnostic Agents] Itching    Social History   Socioeconomic History   Marital status: Divorced    Spouse name: Not on file   Number of children: Not on file   Years of education: Not on file   Highest education level: Not on file  Occupational History   Occupation: Veterinary surgeonrealtor  Social Needs   Financial resource strain: Not on file   Food insecurity    Worry: Not on file    Inability: Not on file   Transportation needs     Medical: Not on file    Non-medical: Not on file  Tobacco Use   Smoking status: Never Smoker   Smokeless tobacco: Never Used  Substance and Sexual Activity   Alcohol use: Yes    Frequency: Never    Comment: OCC WINE   Drug use: Never   Sexual activity: Not on file  Lifestyle   Physical activity    Days per week: Not on file    Minutes per session: Not on file   Stress: Not on file  Relationships   Social connections    Talks on phone: Not on  file    Gets together: Not on file    Attends religious service: Not on file    Active member of club or organization: Not on file    Attends meetings of clubs or organizations: Not on file    Relationship status: Not on file   Intimate partner violence    Fear of current or ex partner: Not on file    Emotionally abused: Not on file    Physically abused: Not on file    Forced sexual activity: Not on file  Other Topics Concern   Not on file  Social History Narrative   [place]    Works as a Community education officerrealtor   President of women's [employer]    Family History  Problem Relation Age of Onset   CAD Mother    Heart disease Mother    Pancreatic cancer Father     ROS- All systems are reviewed and negative except as per the HPI above  Physical Exam: Vitals:   07/11/18 1515  Weight: 100.4 kg  Height: 5\' 5"  (1.651 m)   Wt Readings from Last 3 Encounters:  07/11/18 100.4 kg  02/22/18 94.8 kg  02/21/18 94.8 kg    Labs: Lab Results  Component Value Date   NA 139 02/18/2018   K 4.0 02/18/2018   CL 106 02/18/2018   CO2 25 02/18/2018   GLUCOSE 121 (H) 02/18/2018   BUN 13 02/18/2018   CREATININE 0.71 02/18/2018   CALCIUM 8.8 (L) 02/18/2018   MG 2.0 02/18/2018   Lab Results  Component Value Date   INR 1.13 08/17/2017   Lab Results  Component Value Date   CHOL 220 (H) 02/07/2018   HDL 59 02/07/2018   LDLCALC 136 (H) 02/07/2018   TRIG 123 02/07/2018    GEN- The patient is ill appearing, alert  and oriented x 3 today.   HEENT-head normocephalic, atraumatic, sclera clear, conjunctiva pink, hearing intact, trachea midline. Lungs- Clear to ausculation bilaterally, labored of breathing Heart- irregular rate and rhythm, no murmurs, rubs or gallops  GI- soft, NT, ND, + BS Extremities- no clubbing, cyanosis, or edema MS- no significant deformity or atrophy Skin- no rash or lesion Psych- euthymic mood, full affect Neuro- strength and sensation are intact   EKG- afib with RVR HR 167, QRS 76, QTc 473  Epic records reviewed   Assessment and Plan: 1. Paroxysmal atrial fibrillation with RVR Patient presents today in rapid afib with symptoms of SOB, lightheadedness, and weakness which started <24 hours ago. No recent triggers that patient could identify.  Given patient's symptoms will send to ER to pursue DCCV.  Start Eliquis 5 mg BID and continue for at least 4 weeks post DCCV. Patient has been on Multaq in the past. Recommend resuming this as well as this is her second significant afib episode this year. She has this medication at home.  This patients CHA2DS2-VASc Score and unadjusted Ischemic Stroke Rate (% per year) is equal to 0.6 % stroke rate/year from a score of 1  Above score calculated as 1 point each if present [CHF, HTN, DM, Vascular=MI/PAD/Aortic Plaque, Age if 65-74, or Female] Above score calculated as 2 points each if present [Age > 75, or Stroke/TIA/TE]   2. OSA Not on CPAP currently. Encouraged compliance with CPAP therapy.  3. Obesity Body mass index is 36.84 kg/m. Lifestyle modification was discussed and encouraged including regular physical activity and weight reduction.   F/u one week post DCCV.   Jorja Loaicky Tailer Volkert  PA-C [facility] [address] [place], [place] [address] [phone]

## 2018-07-11 NOTE — ED Triage Notes (Signed)
Pt here for recurrent episode of afib rvr onset this morning at 0130 when she felt her heart racing. Pt endorses heaviness on chest last night but none at present. NAD.

## 2018-07-11 NOTE — ED Notes (Signed)
HR is in NSR. EKG completed and printed

## 2018-07-11 NOTE — ED Provider Notes (Addendum)
[facility]ENC[facility] Provider Note   CSN: [id number] Arrival date & time: 07/11/18  1607    History   Chief Complaint Chief Complaint  Patient presents with  . Atrial Fibrillation    HPI Tracey DubinBarbara A Coomes is a 58 y.o. female.     HPI Patient presents with atrial fibrillation with RVR.  History of same.  Began last night at around 1:30 in the morning.  Not currently on anticoagulation.  Went to the A. fib clinic today and sent here.  Slight heaviness.  No fevers.  No cough.  States she is been more anxious and overexerting herself.  Has been talked for an ablation in the past but she does not want at this time.  States she has had some decreased oral intake.  States she just does not feel that thirsty.  No swelling in her legs.  She is not on stimulants.  States she could be a little dehydrated. Past Medical History:  Diagnosis Date  . Anxiety   . B12 deficiency   . Difficult intubation    "SMALL TRACHEA"  . Dysrhythmia   . GERD (gastroesophageal reflux disease)   . History of kidney stones    H/O  . OSA (obstructive sleep apnea)    USES CPAP ( rarely)  . Palpitations   . Paroxysmal atrial fibrillation [facility](HCC)     Patient Active Problem List   Diagnosis Date Noted  . Snoring 11/21/2017  . OSA (obstructive sleep apnea) 10/12/2017  . Anemia, unspecified 08/22/2017  . Panic attacks 08/22/2017  . Plantar fasciitis 08/22/2017  . SBO (small bowel obstruction) (HCC) 08/18/2017  . Small bowel obstruction (HCC) 08/17/2017  . Obesity 08/10/2017  . Atrial fibrillation with RVR (HCC) 08/10/2017  . Chronic insomnia 05/24/2017  . Intermittent palpitations 04/06/2016  . OSA on CPAP 04/06/2016  . Postmenopausal 04/06/2016  . Anxiety 07/21/2015  . Paroxysmal atrial fibrillation (HCC) 07/21/2015  . Postmenopausal vaginal bleeding 02/27/2014  . Deficiency of other specified B group vitamins 07/21/2013    Past Surgical History:  Procedure Laterality  Date  . AUGMENTATION MAMMAPLASTY  2003  . BREAST BIOPSY Right 2005   benign  . COLONOSCOPY    . DILATATION & CURETTAGE/HYSTEROSCOPY WITH MYOSURE N/A 01/02/2018   Procedure: DILATATION & CURETTAGE/HYSTEROSCOPY WITH MYOSURE, polypectomy;  Surgeon: Christeen DouglasBeasley, Bethany, MD;  Location: [facility] ORS;  Service: Gynecology;  Laterality: N/A;  . ESOPHAGOGASTRODUODENOSCOPY    . KNEE ARTHROSCOPY Left 12/02/2017   Procedure: ARTHROSCOPY KNEE WITH PARTIAL MEDIAL MENISECTOMY, REMOVAL OF LOOSE BODY;  Surgeon: Signa KellPatel, Sunny, MD;  Location: [facility] ORS;  Service: Orthopedics;  Laterality: Left;  . REDUCTION MAMMAPLASTY  2007  . TUMMY TUCK  2006     OB History    Gravida  2   Para  2   Term      Preterm      AB      Living        SAB      TAB      Ectopic      Multiple      Live Births               Home Medications    Prior to Admission medications   Medication Sig Start Date End Date Taking? Authorizing Provider  acetaminophen (TYLENOL) 500 MG tablet Take 2 tablets (1,000 mg total) by mouth every 8 (eight) hours. Patient taking differently: Take 1,000 mg by mouth every 8 (eight) hours as needed for  mild pain or headache.  12/02/17 12/02/18 Yes Signa KellPatel, Sunny, MD  ALPRAZolam Prudy Feeler(XANAX) 0.5 MG tablet Take 0.5-1 tablets (0.25-0.5 mg total) by mouth at bedtime as needed for anxiety. 10/12/17  Yes Dohmeier, Porfirio Mylararmen, MD  aspirin EC 81 MG tablet Take 81 mg by mouth daily.   Yes [provider]  atenolol (TENORMIN) 50 MG tablet Take 1 tablet (50 mg total) by mouth every evening. Please make yearly appt with Dr. Johney FrameAllred for July for future refills. 1st attempt 07/07/18  Yes Allred, Fayrene FearingJames, MD  omeprazole (PRILOSEC) 20 MG capsule Take 20 mg by mouth daily as needed (heartburn).   Yes [provider]  apixaban (ELIQUIS) 5 MG TABS tablet Take 1 tablet (5 mg total) by mouth 2 (two) times daily for 30 days. 07/11/18 08/10/18  Benjiman CorePickering, Deaundra Dupriest, MD  dronedarone (MULTAQ) 400 MG tablet Take 1 tablet  (400 mg total) by mouth 2 (two) times daily with a meal. 07/11/18   Benjiman CorePickering, Boston Catarino, MD  Vitamin D, Ergocalciferol, (DRISDOL) 1.25 MG (50000 UT) CAPS capsule Take 1 capsule (50,000 Units total) by mouth every 7 (seven) days. Patient not taking: Reported on 07/11/2018 02/21/18   [place] NickelBrown, Angel A, DO    Family History Family History  Problem Relation Age of Onset  . CAD Mother   . Heart disease Mother   . Pancreatic cancer Father     Social History Social History   Tobacco Use  . Smoking status: Never Smoker  . Smokeless tobacco: Never Used  Substance Use Topics  . Alcohol use: Yes    Frequency: Never    Comment: OCC WINE  . Drug use: Never     Allergies   Codeine and Ivp dye [iodinated diagnostic agents]   Review of Systems Review of Systems  Constitutional: Negative for appetite change.  HENT: Negative for congestion.   Respiratory: Negative for shortness of breath.   Cardiovascular: Positive for chest pain and palpitations.  Gastrointestinal: Negative for abdominal pain.  Genitourinary: Negative for flank pain.  Musculoskeletal: Negative for back pain.  Skin: Negative for rash.  Neurological: Negative for weakness.  Psychiatric/Behavioral: Negative for confusion.     Physical Exam Updated Vital Signs BP 104/65   Pulse 74   Temp 98.1 F (36.7 C) (Oral)   Resp 16   Ht 5' 5.5" (1.664 m)   Wt 100.2 kg   LMP 08/09/2011 (Approximate) Comment: PT STILL SPOTS AS OF 08-08-17  SpO2 100%   BMI 36.22 kg/m   Physical Exam Vitals signs and nursing note reviewed.  HENT:     Head: Atraumatic.     Mouth/Throat:     Mouth: Mucous membranes are moist.  Cardiovascular:     Rate and Rhythm: Tachycardia present.  Pulmonary:     Breath sounds: No wheezing, rhonchi or rales.  Abdominal:     Tenderness: There is no abdominal tenderness.  Musculoskeletal:        General: No tenderness.  Skin:    General: Skin is warm.     Capillary Refill: Capillary refill takes less  than 2 seconds.  Neurological:     Mental Status: She is alert.      ED Treatments / Results  Labs (all labs ordered are listed, but only abnormal results are displayed) Labs Reviewed  BASIC METABOLIC PANEL - Abnormal; Notable for the following components:      Result Value   Glucose, Bld 109 (*)    All other components within normal limits  CBC  EKG None  Radiology No results found.  Procedures Sedation procedure  Date/Time: 07/11/2018 6:17 PM Performed by: Benjiman CorePickering, Jaramiah Bossard, MD Authorized by: Benjiman CorePickering, Latera Mclin, MD   Consent:    Consent obtained:  Written   Consent given by:  Patient   Risks discussed:  Prolonged hypoxia resulting in organ damage, respiratory compromise necessitating ventilatory assistance and intubation, prolonged sedation necessitating reversal, vomiting, nausea, inadequate sedation, allergic reaction and dysrhythmia Universal protocol:    Immediately prior to procedure a time out was called: yes     Patient identity confirmation method:  Arm band Indications:    Procedure performed:  Cardioversion Pre-sedation assessment:    Time since last food or drink:  3 hrs   ASA classification: class 2 - patient with mild systemic disease     Mallampati score:  Unable to assess   Pre-sedation assessments completed and reviewed: airway patency, cardiovascular function, hydration status and mental status     Pre-sedation assessments completed and reviewed: nausea/vomiting not reviewed     History of difficult intubation: yes   Procedure details (see MAR for exact dosages):    Preoxygenation:  Nasal cannula   Sedation:  Etomidate   Intra-procedure monitoring:  Blood pressure monitoring, cardiac monitor, continuous pulse oximetry, frequent LOC assessments and frequent vital sign checks   Intra-procedure events: none     Total Provider sedation time (minutes):  5 Post-procedure details:    Post-sedation assessment completed:  07/11/2018 6:19 PM    Attendance: Constant attendance by certified staff until patient recovered     Recovery: Patient returned to pre-procedure baseline     Patient is stable for discharge or admission: yes     Patient tolerance:  Tolerated well, no immediate complications .Cardioversion  Date/Time: 07/11/2018 6:19 PM Performed by: Benjiman CorePickering, Aybree Lanyon, MD Authorized by: Benjiman CorePickering, Lebron Nauert, MD   Consent:    Consent obtained:  Written   Consent given by:  Patient   Risks discussed:  Cutaneous burn, death, pain and induced arrhythmia   Alternatives discussed:  No treatment Pre-procedure details:    Cardioversion basis:  Elective   Rhythm:  Atrial fibrillation   Electrode placement:  Anterior-posterior Patient sedated: Yes. Refer to sedation procedure documentation for details of sedation.  Attempt one:    Cardioversion mode:  Synchronous   Waveform:  Biphasic   Shock (Joules):  200   Shock outcome:  Conversion to normal sinus rhythm Post-procedure details:    Patient status:  Awake   (including critical care time)  Medications Ordered in ED Medications  etomidate (AMIDATE) injection 5 mg (5 mg Intravenous See Procedure Record 07/11/18 1736)  sodium chloride 0.9 % bolus 500 mL (500 mLs Intravenous New Bag/Given 07/11/18 1736)  etomidate (AMIDATE) injection (5 mg Intravenous Given 07/11/18 1725)     Initial Impression / Assessment and Plan / ED Course  I have reviewed the triage vital signs and the nursing notes.  Pertinent labs & imaging results that were available during my care of the patient were reviewed by me and considered in my medical decision making (see chart for details).       Patient presented with A. fib with RVR.  Acute onset last night so she was within the window for cardioversion.  Tolerated cardioversion well in the ER.  Feels much better.  Reviewed A. fib clinic note.  Will restart Eliquis and Multaq.  Chadsvasc score of 1.  Patient was cardioverted at 200 J with 5 mg of  etomidate with good results.   CRITICAL  CARE Performed by: Davonna Belling Total critical care time: 30 minutes Critical care time was exclusive of separately billable procedures and treating other patients. Critical care was necessary to treat or prevent imminent or life-threatening deterioration. Critical care was time spent personally by me on the following activities: development of treatment plan with patient and/or surrogate as well as nursing, discussions with consultants, evaluation of patient's response to treatment, examination of patient, obtaining history from patient or surrogate, ordering and performing treatments and interventions, ordering and review of laboratory studies, ordering and review of radiographic studies, pulse oximetry and re-evaluation of patient's condition.   Final Clinical Impressions(s) / ED Diagnoses   Final diagnoses:  Atrial fibrillation with RVR [facility])    ED Discharge Orders         Ordered    apixaban (ELIQUIS) 5 MG TABS tablet  2 times daily     07/11/18 1815    dronedarone (MULTAQ) 400 MG tablet  2 times daily with meals     07/11/18 1815           Davonna Belling, MD 07/11/18 Tresa Moore    Davonna Belling, MD 08/13/18 [phone]

## 2018-07-11 NOTE — ED Notes (Addendum)
Pt shocked at [place] by MD Alvino Chapel.

## 2018-07-21 ENCOUNTER — Encounter (HOSPITAL_COMMUNITY): Payer: Self-pay | Admitting: Nurse Practitioner

## 2018-07-21 ENCOUNTER — Other Ambulatory Visit: Payer: Self-pay

## 2018-07-21 ENCOUNTER — Ambulatory Visit (HOSPITAL_COMMUNITY)
Admission: RE | Admit: 2018-07-21 | Discharge: 2018-07-21 | Disposition: A | Discharge status: Home / Self Care | Payer: BLUE CROSS/BLUE SHIELD | Source: Ambulatory Visit | Attending: Nurse Practitioner | Admitting: Nurse Practitioner

## 2018-07-21 VITALS — BP 130/72 | HR 56 | Ht 65.5 in | Wt 219.0 lb

## 2018-07-21 DIAGNOSIS — Z79899 Other long term (current) drug therapy: Secondary | ICD-10-CM | POA: Diagnosis not present

## 2018-07-21 DIAGNOSIS — Z7901 Long term (current) use of anticoagulants: Secondary | ICD-10-CM | POA: Diagnosis not present

## 2018-07-21 DIAGNOSIS — I480 Paroxysmal atrial fibrillation: Secondary | ICD-10-CM | POA: Diagnosis not present

## 2018-07-21 DIAGNOSIS — K219 Gastro-esophageal reflux disease without esophagitis: Secondary | ICD-10-CM | POA: Insufficient documentation

## 2018-07-21 DIAGNOSIS — G4733 Obstructive sleep apnea (adult) (pediatric): Secondary | ICD-10-CM | POA: Insufficient documentation

## 2018-07-21 DIAGNOSIS — F419 Anxiety disorder, unspecified: Secondary | ICD-10-CM | POA: Diagnosis not present

## 2018-07-21 NOTE — Progress Notes (Signed)
Primary Care Physician: Leotis ShamesSingh, Jasmine, MD Referring Physician:  F/u hospitalization  [facility]     Tracey DubinBarbara A Lovering is a 58 y.o. female with a h/o paroxysmal afib, sleep apnea, treated with CPAP that is in the afib clinic for f/u hospitalization for afib while in [place], [place] to [place]. She was walking to her car and  was aware of increased HR, dizziness and lightheadedness and was taken to [facility].  She had been out in the sun for about 30 mins and her fluid intake had been less than usual  for the day. Her HR was 170 bpm. She was thought to have mild dehydration.She was admitted, placed on Cardizem drip for a couple of days then started on Multaq, ranexa, atenolol, eliquis and did convert to SR prior to discharge. The MD told pt that she would need quick f/u as multaq was for short term as it could cause liver failure. She is planning to see Dr. Johney FrameAllred soon. She would like to discuss coming off drugs and a possible ablation down the line. CHA2DS2VASc score is 1 for female. She would like to get clearance to have her knee surgery as she wants this first so she can get back to exercising to obtain wight loss. [facility] and with normal results( all records can be found in Care Everywhere).   She has had afib for many years but it had been quiet, [place] in the [place] area until [place] moved to [place] 6 years ago. She saw Dr. Darrold JunkerParaschos a couple of times but has not seen him since 2015.  She was suppose to have knee surgery last week but it was cancelled.  She states that she has gained  40 lbs over the last 6 years. She does use cpap, but still sleeps very poorly. Feels that she does get at least 4 hours a night use.  F/u in afib clinic 9/9. When she saw Dr. Johney FrameAllred, he advised her to start weaning off the ranexa, multaq and DOAC which she has successfully done. She  feels well. No further afib. She did have a SBO which self released right after I  saw her last in clinic. No further reoccurrence.   F/u in afib clinic 02/22/18, as she had a ER visit for afib and went to the ER for a cardioversion. She had multaq and eliquis on hand and took these but did not help to restore SR.  The cardioversion was successful and she is in SR today. She still would prefer to stay off daily meds . She has talked to Dr. Johney FrameAllred about an ablation in the past but is still not quite ready for this either unless afib burden increases. She is seeing a weight loss specialist. She has not used her cpap for sleep apnea in some time, cannot tolerate mask. She thinks the trigger was lack of sleep and working long hours.   F/u in AF clinic 07/11/18. Patient reports that last night she began having feelings of SOB, weakness, and heart racing. In office today she is in afib with RVR and is very SOB, especially when walking. She has not passed out but has also been very lightheaded. She is not on Eliquis or Multaq at this time.  She was sent to the ER for urgent cardioversion.  F/u afib clinic, 07/21/18. She is s/p successful  CV and is staying in rhythm. She remains on Multaq and Eliquis but does not want  to stay on daily long term. She cannot tolerate the cpap and states that she sleeps poorly. She has stopped going to the weight loss MD as it was inconvenient for her in [place]. She has gained weight with Covid and working from home.    Today, she denies symptoms of palpitations, chest pain, shortness of breath, orthopnea, PND, lower extremity edema, dizziness, presyncope, syncope, or neurologic sequela. The patient is tolerating medications without difficulties and is otherwise without complaint today.   Past Medical History:  Diagnosis Date   Anxiety    B12 deficiency    Difficult intubation    "SMALL TRACHEA"   Dysrhythmia    GERD (gastroesophageal reflux disease)    History of kidney stones    H/O   OSA (obstructive sleep apnea)    USES CPAP ( rarely)    Palpitations    Paroxysmal atrial fibrillation (HCC)    Past Surgical History:  Procedure Laterality Date   AUGMENTATION MAMMAPLASTY  2003   BREAST BIOPSY Right 2005   benign   COLONOSCOPY     DILATATION & CURETTAGE/HYSTEROSCOPY WITH MYOSURE N/A 01/02/2018   Procedure: DILATATION & CURETTAGE/HYSTEROSCOPY WITH MYOSURE, polypectomy;  Surgeon: Christeen DouglasBeasley, Bethany, MD;  Location: [facility] ORS;  Service: Gynecology;  Laterality: N/A;   ESOPHAGOGASTRODUODENOSCOPY     KNEE ARTHROSCOPY Left 12/02/2017   Procedure: ARTHROSCOPY KNEE WITH PARTIAL MEDIAL MENISECTOMY, REMOVAL OF LOOSE BODY;  Surgeon: Signa KellPatel, Sunny, MD;  Location: [facility] ORS;  Service: Orthopedics;  Laterality: Left;   REDUCTION MAMMAPLASTY  2007   TUMMY TUCK  2006    Current Outpatient Medications  Medication Sig Dispense Refill   acetaminophen (TYLENOL) 500 MG tablet Take 2 tablets (1,000 mg total) by mouth every 8 (eight) hours. (Patient taking differently: Take 1,000 mg by mouth every 8 (eight) hours as needed for mild pain or headache. ) 90 tablet 2   ALPRAZolam (XANAX) 0.5 MG tablet Take 0.5-1 tablets (0.25-0.5 mg total) by mouth at bedtime as needed for anxiety. 30 tablet 0   apixaban (ELIQUIS) 5 MG TABS tablet Take 1 tablet (5 mg total) by mouth 2 (two) times daily for 30 days. 60 tablet 0   atenolol (TENORMIN) 50 MG tablet Take 1 tablet (50 mg total) by mouth every evening. Please make yearly appt with Dr. Johney FrameAllred for July for future refills. 1st attempt 90 tablet 0   dronedarone (MULTAQ) 400 MG tablet Take 1 tablet (400 mg total) by mouth 2 (two) times daily with a meal. 60 tablet 0   omeprazole (PRILOSEC) 20 MG capsule Take 20 mg by mouth daily as needed (heartburn).     No current facility-administered medications for this encounter.     Allergies  Allergen Reactions   Codeine Nausea And Vomiting   Ivp Dye [Iodinated Diagnostic Agents] Itching    Social History   Socioeconomic History   Marital status:  Divorced    Spouse name: Not on file   Number of children: Not on file   Years of education: Not on file   Highest education level: Not on file  Occupational History   Occupation: Veterinary surgeonrealtor  Social Needs   Financial resource strain: Not on file   Food insecurity    Worry: Not on file    Inability: Not on file   Transportation needs    Medical: Not on file    Non-medical: Not on file  Tobacco Use   Smoking status: Never Smoker   Smokeless tobacco: Never Used  Substance and Sexual Activity  Alcohol use: Yes    Frequency: Never    Comment: OCC WINE   Drug use: Never   Sexual activity: Not on file  Lifestyle   Physical activity    Days per week: Not on file    Minutes per session: Not on file   Stress: Not on file  Relationships   Social connections    Talks on phone: Not on file    Gets together: Not on file    Attends religious service: Not on file    Active member of club or organization: Not on file    Attends meetings of clubs or organizations: Not on file    Relationship status: Not on file   Intimate partner violence    Fear of current or ex partner: Not on file    Emotionally abused: Not on file    Physically abused: Not on file    Forced sexual activity: Not on file  Other Topics Concern   Not on file  Social History Narrative   Lives in [place]    Works as a Producer, 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 [employer]    Family History  Problem Relation Age of Onset   CAD Mother    Heart disease Mother    Pancreatic cancer Father     ROS- All systems are reviewed and negative except as per the HPI above  Physical Exam: Vitals:   07/21/18 1201  BP: 130/72  Pulse: (!) 56  Weight: 99.3 kg  Height: 5' 5.5" (1.664 m)   Wt Readings from Last 3 Encounters:  07/21/18 99.3 kg  07/11/18 100.2 kg  07/11/18 100.4 kg    Labs: Lab Results  Component Value Date   NA 141 07/11/2018   K 4.1 07/11/2018   CL 108 07/11/2018   CO2 27  07/11/2018   GLUCOSE 109 (H) 07/11/2018   BUN 14 07/11/2018   CREATININE 0.81 07/11/2018   CALCIUM 9.0 07/11/2018   MG 2.0 02/18/2018   Lab Results  Component Value Date   INR 1.13 08/17/2017   Lab Results  Component Value Date   CHOL 220 (H) 02/07/2018   HDL 59 02/07/2018   LDLCALC 136 (H) 02/07/2018   TRIG 123 02/07/2018     GEN- The patient is well appearing, alert and oriented x 3 today.   Head- normocephalic, atraumatic Eyes-  Sclera clear, conjunctiva pink Ears- hearing intact Oropharynx- clear Neck- supple, no JVP Lymph- no cervical lymphadenopathy Lungs- Clear to ausculation bilaterally, normal work of breathing Heart- Regular rate and rhythm, no murmurs, rubs or gallops, PMI not laterally displaced GI- soft, NT, ND, + BS Extremities- no clubbing, cyanosis, or edema MS- no significant deformity or atrophy Skin- no rash or lesion Psych- euthymic mood, full affect Neuro- strength and sensation are intact  EKG-Sinus brady at 56 bpm, pr int 132 ms, qrs int 86 ms, qtc 467 ms Epic records reviewed Care Everywhere    Assessment and Plan: 1. Paroxysmal afib Recent successful ER cardioversion She would prefer not to take daily Multaq She is not ready for ablation unless more significant afib burden Afib triggers reviewed    2. Chadsvasc score of 1 for female Now back on eliquis for 4 weeks following cardioversion She wants to stop again  after 4 weeks   4. Lifestyle issues Cannot tolerate cpap for claustrophobia I mentioned Dr. Oneal Grout and oral appliance for sleep apnea Weight management /exercise encouraged  F/u with Dr. [name] in August as already scheduled  Elvina Sidleonna C. Matthew Folksarroll, ANP-C Afib Clinic [facility] [address] [place], [place] [address] [phone]

## 2018-08-14 ENCOUNTER — Other Ambulatory Visit: Payer: Self-pay | Admitting: Neurology

## 2018-08-14 ENCOUNTER — Telehealth: Payer: Self-pay | Admitting: Neurology

## 2018-08-14 ENCOUNTER — Telehealth: Payer: Self-pay | Admitting: Internal Medicine

## 2018-08-14 DIAGNOSIS — F5104 Psychophysiologic insomnia: Secondary | ICD-10-CM

## 2018-08-14 DIAGNOSIS — G4733 Obstructive sleep apnea (adult) (pediatric): Secondary | ICD-10-CM

## 2018-08-14 DIAGNOSIS — I480 Paroxysmal atrial fibrillation: Secondary | ICD-10-CM

## 2018-08-14 DIAGNOSIS — R0683 Snoring: Secondary | ICD-10-CM

## 2018-08-14 NOTE — Telephone Encounter (Signed)
New Message   Patient is calling because she was advised by Dr. [name] that she may need a CPAP appliance such as the mouth guard. Dr. [name] had mentioned about seeing Dr. Radene Ou DDS so she is needing a referral to see the provider.

## 2018-08-14 NOTE — Telephone Encounter (Signed)
Pt states a referral is needed for her to see Dr Threasa Beards, he is a dentist that specializes in the mouth guard for sleep apnea, his office # is [phone]

## 2018-08-14 NOTE — Telephone Encounter (Signed)
Referral to dentist order placed for Dr Ron Parker office for the patient to get the dental device for treatment of sleep apnea and snoring.

## 2018-08-14 NOTE — Telephone Encounter (Signed)
Pt is calling to ask Dr [name] and RN if they could place a referral for her to go and see Dr. Radene Ou DDS, for fitting of an oral appliance for her CPAP machine.  Pt states she cannot use her CPAP mask or cannula anymore, for it causes her more anxiety than anything, therefore causing her not to sleep well.  Pt states that the 2 times she went into afib, was due to poor sleep, which is why she was referred for sleep study and is currently being followed for sleep by Dr. Brett Fairy with [facility].  I informed the pt that Dr [name] and his RN are both out of the office today, but to obtain a correct referral from the Physician who currently follows her sleep, she should call  her sleep MD, Dr. Maureen Chatters, and request that they place the referral for the pt to see Dr. Radene Ou DDS, for fitting of sleep oral appliance.  Informed the pt that I will still route this message to Dr Jackalyn Lombard RN for further review and follow as needed, but the referral should more than likely come from her Physician who follows her for sleep. Pt verbalized understanding and agrees with this plan.

## 2018-08-15 ENCOUNTER — Other Ambulatory Visit: Payer: Self-pay | Admitting: Neurology

## 2018-08-15 MED ORDER — ALPRAZOLAM 0.5 MG PO TABS: 0.2500 mg | ORAL_TABLET | Freq: Every evening | ORAL | 0 refills | Status: AC | PRN

## 2018-08-15 NOTE — Addendum Note (Signed)
Addended by: Darleen Crocker on: 08/15/2018 08:59 AM   Modules accepted: Orders

## 2018-08-15 NOTE — Telephone Encounter (Signed)
I have routed this request to Dr Dohmeier for review. The pt is due for the medication and [place] registry was verified.  

## 2018-08-15 NOTE — Telephone Encounter (Signed)
Per review of Pt chart she received a referral to Dr. Ron Parker from Dr. Brett Fairy.  No further action needed.

## 2018-08-15 NOTE — Telephone Encounter (Signed)
Pt has called for a refill on her ALPRAZolam (XANAX) 0.5 MG tablet [id number]

## 2018-09-08 ENCOUNTER — Telehealth: Payer: Self-pay

## 2018-09-11 ENCOUNTER — Telehealth: Payer: Self-pay

## 2018-09-11 NOTE — Telephone Encounter (Signed)
Left message regarding appt on 09/13/18. 

## 2018-09-13 ENCOUNTER — Telehealth: Payer: BLUE CROSS/BLUE SHIELD | Admitting: Internal Medicine

## 2018-09-13 NOTE — Telephone Encounter (Signed)
Spoke with pt regarding her appt on 09/13/18. Pt stated she only wants office visit and cancelled her appt. Pt was advise to keep virtual appt, but declined.

## 2018-09-19 ENCOUNTER — Other Ambulatory Visit: Payer: Self-pay

## 2018-09-19 ENCOUNTER — Encounter: Payer: Self-pay | Admitting: Physician Assistant

## 2018-09-19 ENCOUNTER — Ambulatory Visit (INDEPENDENT_AMBULATORY_CARE_PROVIDER_SITE_OTHER): Payer: BLUE CROSS/BLUE SHIELD | Admitting: Physician Assistant

## 2018-09-19 VITALS — BP 118/76 | HR 63 | Ht 65.5 in | Wt 221.0 lb

## 2018-09-19 DIAGNOSIS — I480 Paroxysmal atrial fibrillation: Secondary | ICD-10-CM

## 2018-09-19 DIAGNOSIS — G4730 Sleep apnea, unspecified: Secondary | ICD-10-CM | POA: Diagnosis not present

## 2018-09-19 NOTE — Progress Notes (Signed)
Cardiology Office Note Date:  09/19/2018  Patient ID:  Tracey, Lin March 16, 1960, MRN [id number] PCP:  Glendon Axe, MD  Electrophysiologist:  Dr. [name]      Chief Complaint:  History of Present Illness: Tracey Lin is a 58 y.o. female with history of GERD, OSA noncompliant w/CPAP, and paroxysmal Afib.  She comes in today to be seen for Dr. [name].  Last seen by him July 2019.  At that time she had just had her 1st AFib recurrence in several years.  She had been started on Multaq and Ranexa at an outside hospital and given the rare occurrence of her AF to date decided a more conservative approach and AAD use was discontinued with plans to stop a/c as well given risk score of one after completing her 4 weeks post DCCV.  She has had f/u on a couple occassions with the Afib clinic.  She had an ER visit in Jan 2020 with AFib successfully cardioverted.  F/u in the Afib lcinic noted she preferred to stay ofd AAD and not ready to consider ablation yet.  She was not using her CPAP with mask intolerance and urged life style changes, weight loss.  She was recommended to see Dr. Cassell Clement to eval for apnea dental appliance, and maintained again 4 weeks on a/c.  Recurrent and very symptomatic AFib episode in June required ER visit with weakness, SOB, was DCCV.  She had f/u again in Afib clinic.  She remained against the idea of daily medications.  Unfortunately still not wearing CPAP, not sleeping well.  And had stopped seeing weight loss specialist with some weight gain, especially with [place] working from home.  Encouraged to resume life style strategies, was not ready to revisit ablation yet.  She has not had any further AFib though feels like the Multaq makes her sleep very poorly and is tired all of time.  She reports she has been on the tenormin daily for the last 15 years 2/2 her AF, has been on/off the multaq of late with recurrent Afib, though her goal is to not be on an AAD.  Outside  of her Afib episodes feels fairly well, no CP, palpitations or SOB.  She doesn't drink much water and occassionally feel like her BP is a little low.  No near syncope or syncope.  She saw Dr. Ron Parker had a dental appliance made, should be getting it in the next couple days.  Says she was diagnosed with sleep apnea years ago, reported as a mild case, but just unable to tolerate the mask.   Since covid she has gained weight and has stopped exercise, this frustrates her because she knows better.  She does have chronic L knee issues and neck pain, but able to exercise.   AFib Hx initial diagnosis 15 + year, long hiatus without until 07/2017 AAD 07/2017, Multaq and Ranexa  >>> stopped 07/2017 given rare exacerbations    Past Medical History:  Diagnosis Date  . Anxiety   . B12 deficiency   . Difficult intubation    "SMALL TRACHEA"  . Dysrhythmia   . GERD (gastroesophageal reflux disease)   . History of kidney stones    H/O  . OSA (obstructive sleep apnea)    USES CPAP ( rarely)  . Palpitations   . Paroxysmal atrial fibrillation [facility])     Past Surgical History:  Procedure Laterality Date  . AUGMENTATION MAMMAPLASTY  2003  . BREAST BIOPSY Right 2005   benign  .  COLONOSCOPY    . DILATATION & CURETTAGE/HYSTEROSCOPY WITH MYOSURE N/A 01/02/2018   Procedure: DILATATION & CURETTAGE/HYSTEROSCOPY WITH MYOSURE, polypectomy;  Surgeon: Christeen Douglas, MD;  Location: [facility] ORS;  Service: Gynecology;  Laterality: N/A;  . ESOPHAGOGASTRODUODENOSCOPY    . KNEE ARTHROSCOPY Left 12/02/2017   Procedure: ARTHROSCOPY KNEE WITH PARTIAL MEDIAL MENISECTOMY, REMOVAL OF LOOSE BODY;  Surgeon: Signa Kell, MD;  Location: [facility] ORS;  Service: Orthopedics;  Laterality: Left;  . REDUCTION MAMMAPLASTY  2007  . TUMMY TUCK  2006    Current Outpatient Medications  Medication Sig Dispense Refill  . acetaminophen (TYLENOL) 500 MG tablet Take 2 tablets (1,000 mg total) by mouth every 8 (eight) hours. (Patient taking  differently: Take 1,000 mg by mouth every 8 (eight) hours as needed for mild pain or headache. ) 90 tablet 2  . ALPRAZolam (XANAX) 0.5 MG tablet Take 0.5-1 tablets (0.25-0.5 mg total) by mouth at bedtime as needed for anxiety. 30 tablet 0  . atenolol (TENORMIN) 50 MG tablet Take 1 tablet (50 mg total) by mouth every evening. Please make yearly appt with Dr. Johney Frame for July for future refills. 1st attempt 90 tablet 0  . dronedarone (MULTAQ) 400 MG tablet Take 1 tablet (400 mg total) by mouth 2 (two) times daily with a meal. 60 tablet 0  . omeprazole (PRILOSEC) 20 MG capsule Take 20 mg by mouth daily as needed (heartburn).     No current facility-administered medications for this visit.     Allergies:   Codeine and Ivp dye [iodinated diagnostic agents]   Social History:  The patient  reports that she has never smoked. She has never used smokeless tobacco. She reports current alcohol use. She reports that she does not use drugs.   Family History:  The patient's family history includes CAD in her mother; Heart disease in her mother; Pancreatic cancer in her father.  ROS:  Please see the history of present illness.  All other systems are reviewed and otherwise negative.   PHYSICAL EXAM:  VS:  BP 118/76   Pulse 63   Ht 5' 5.5" (1.664 m)   Wt 221 lb (100.2 kg)   LMP 08/09/2011 (Approximate) Comment: PT STILL SPOTS AS OF 08-08-17  BMI 36.22 kg/m  BMI: Body mass index is 36.22 kg/m. Well nourished, well developed, in no acute distress  HEENT: normocephalic, atraumatic  Neck: no JVD, carotid bruits or masses Cardiac:  RRR; no significant murmurs, no rubs, or gallops Lungs:  CTA b/l, no wheezing, rhonchi or rales  Abd: soft, nontender MS: no deformity or atrophy Ext: no edema  Skin: warm and dry, no rash Neuro:  No gross deficits appreciated Psych: euthymic mood, full affect     EKG:  07/21/2018 SB 56bpm   08/11/17: TTE Conclusion A contrast injection of Definity was performed to  improve assessment of LV function. Ejection Fraction = 60-65%. There is no pericardial effusion. The left atrial size is normal.   Recent Labs: 02/07/2018: ALT 25; TSH 2.350 02/18/2018: Magnesium 2.0 07/11/2018: BUN 14; Creatinine, Ser 0.81; Hemoglobin 13.2; Platelets 367; Potassium 4.1; Sodium 141  02/07/2018: Cholesterol, Total 220; HDL 59; LDL Calculated 136; Triglycerides 123   CrCl cannot be calculated (Patient's most recent lab result is older than the maximum 21 days allowed.).   Wt Readings from Last 3 Encounters:  09/19/18 221 lb (100.2 kg)  07/21/18 219 lb (99.3 kg)  07/11/18 221 lb (100.2 kg)     Other studies reviewed: Additional studies/records reviewed today include: summarized  above  ASSESSMENT AND PLAN:  1. Paroxysmal AFib     CHA21DS2Vasc is one (female)      Increasing frequency in the last year of her AFib We revisited AFib triggers  Apnea Weight Sedentary life style Age She drinks ETOH very munually  She gets her dental appliance for sleep apnea in a day or two She is motivated to exercise and lose weight, discussed starting slow and safety She drinks minimal water, discussed importance of better hydration as well  Her goal is to not be on an AAD  2. OSA     Pending her dental appliance     Disposition: F/u in 4-6 weeks after she has had her dental appliance, started exercising, she would like to come off the Multaq, though suggest lifestyle strategies are underway first    Current medicines are reviewed at length with the patient today.  The patient did not have any concerns regarding medicines.  Norma FredricksonSigned, Taura Lamarre, PA-C 09/19/2018 12:02 PM     [facility] [address] [place] [address] [phone] (office)  [phone] (fax)

## 2018-09-19 NOTE — Patient Instructions (Signed)
Medication Instructions:  Your physician recommends that you continue on your current medications as directed. Please refer to the Current Medication list given to you today.  If you need a refill on your cardiac medications before your next appointment, please call your pharmacy.   Lab work: .NON   If you have labs (blood work) drawn today and your tests are completely normal, you will receive your results only by: Marland Kitchen MyChart Message (if you have MyChart) OR . A paper copy in the mail If you have any lab test that is abnormal or we need to change your treatment, we will call you to review the results.  Testing/Procedures: NONE ORDERED  TODAY    Follow-Up: At [facility], you and your health needs are our priority.  As part of our continuing mission to provide you with exceptional heart care, we have created designated Provider Care Teams.  These Care Teams include your primary Cardiologist (physician) and Advanced Practice Providers (APPs -  Physician Assistants and Nurse Practitioners) who all work together to provide you with the care you need, when you need it. . You will need a follow up appointment in 4-6 weeks.  Tommye Standard, PA-C  Any Other Special Instructions Will Be Listed Below (If Applicable).

## 2018-09-20 ENCOUNTER — Other Ambulatory Visit (HOSPITAL_COMMUNITY): Payer: Self-pay | Admitting: *Deleted

## 2018-09-20 MED ORDER — DRONEDARONE HCL 400 MG PO TABS: 400.0000 mg | ORAL_TABLET | Freq: Two times a day (BID) | ORAL | 3 refills | Status: DC

## 2018-09-21 ENCOUNTER — Other Ambulatory Visit: Payer: Self-pay | Admitting: Neurology

## 2018-09-21 ENCOUNTER — Other Ambulatory Visit: Payer: Self-pay | Admitting: Internal Medicine

## 2018-10-09 ENCOUNTER — Telehealth: Payer: Self-pay | Admitting: Neurology

## 2018-10-09 NOTE — Telephone Encounter (Signed)
Dr Ron Parker office has reached out to inform us the patient has started her dental device.

## 2018-11-03 ENCOUNTER — Ambulatory Visit: Payer: BLUE CROSS/BLUE SHIELD | Admitting: Physician Assistant

## 2019-01-16 ENCOUNTER — Other Ambulatory Visit (HOSPITAL_COMMUNITY): Payer: Self-pay | Admitting: Nurse Practitioner

## 2019-03-30 ENCOUNTER — Emergency Department (HOSPITAL_COMMUNITY)
Admission: EM | Admit: 2019-03-30 | Discharge: 2019-03-30 | Disposition: A | Discharge status: Home / Self Care | Payer: BLUE CROSS/BLUE SHIELD | Attending: Emergency Medicine | Admitting: Emergency Medicine

## 2019-03-30 ENCOUNTER — Ambulatory Visit (HOSPITAL_BASED_OUTPATIENT_CLINIC_OR_DEPARTMENT_OTHER)
Admission: RE | Admit: 2019-03-30 | Discharge: 2019-03-30 | Disposition: A | Discharge status: Home / Self Care | Payer: BLUE CROSS/BLUE SHIELD | Source: Ambulatory Visit | Attending: Nurse Practitioner | Admitting: Nurse Practitioner

## 2019-03-30 ENCOUNTER — Other Ambulatory Visit: Payer: Self-pay

## 2019-03-30 ENCOUNTER — Encounter (HOSPITAL_COMMUNITY): Payer: Self-pay | Admitting: *Deleted

## 2019-03-30 ENCOUNTER — Encounter (HOSPITAL_COMMUNITY): Payer: Self-pay | Admitting: Nurse Practitioner

## 2019-03-30 VITALS — BP 102/70 | HR 138 | Ht 65.5 in | Wt 222.0 lb

## 2019-03-30 DIAGNOSIS — I4891 Unspecified atrial fibrillation: Secondary | ICD-10-CM | POA: Diagnosis not present

## 2019-03-30 DIAGNOSIS — Z79899 Other long term (current) drug therapy: Secondary | ICD-10-CM | POA: Insufficient documentation

## 2019-03-30 DIAGNOSIS — I480 Paroxysmal atrial fibrillation: Secondary | ICD-10-CM

## 2019-03-30 DIAGNOSIS — Z7982 Long term (current) use of aspirin: Secondary | ICD-10-CM | POA: Insufficient documentation

## 2019-03-30 LAB — BASIC METABOLIC PANEL
Anion gap: 10 (ref 5–15)
BUN: 11 mg/dL (ref 6–20)
CO2: 23 mmol/L (ref 22–32)
Calcium: 8.9 mg/dL (ref 8.9–10.3)
Chloride: 107 mmol/L (ref 98–111)
Creatinine, Ser: 0.77 mg/dL (ref 0.44–1.00)
GFR calc Af Amer: >60 mL/min (ref >60)
GFR calc non Af Amer: >60 mL/min (ref >60)
Glucose, Bld: 103 mg/dL — ABNORMAL HIGH (ref 70–99)
Potassium: 4.3 mmol/L (ref 3.5–5.1)
Sodium: 140 mmol/L (ref 135–145)

## 2019-03-30 LAB — CBC
HCT: 40.7 % (ref 36.0–46.0)
Hemoglobin: 13 g/dL (ref 12.0–15.0)
MCH: 29.9 pg (ref 26.0–34.0)
MCHC: 31.9 g/dL (ref 30.0–36.0)
MCV: 93.6 fL (ref 80.0–100.0)
Platelets: 358 10*3/uL (ref 150–400)
RBC: 4.35 MIL/uL (ref 3.87–5.11)
RDW: 13 % (ref 11.5–15.5)
WBC: 9.2 10*3/uL (ref 4.0–10.5)
nRBC: 0 % (ref 0.0–0.2)

## 2019-03-30 MED ORDER — ETOMIDATE 2 MG/ML IV SOLN
5.0000 mg | Freq: Once | INTRAVENOUS | Status: AC
  Administered 2019-03-30: 5 mg via INTRAVENOUS
  Filled 2019-03-30: qty 10

## 2019-03-30 MED ORDER — ETOMIDATE 2 MG/ML IV SOLN: INTRAVENOUS | Status: AC | PRN

## 2019-03-30 MED ORDER — APIXABAN 5 MG PO TABS: 5.0000 mg | ORAL_TABLET | Freq: Two times a day (BID) | ORAL | 3 refills | Status: DC

## 2019-03-30 MED ORDER — ETOMIDATE 2 MG/ML IV SOLN: 10.0000 mg | Freq: Once | INTRAVENOUS | Status: DC

## 2019-03-30 NOTE — Progress Notes (Signed)
RT at bedside for cardioversion. Pt stable throughout with no complications. VS within normal limits.

## 2019-03-30 NOTE — ED Triage Notes (Addendum)
States she was in the a-fib clinic  And was sent to the ED for cardioversion. States she had an episode last week that resolved on its own and again last pm however she still had it today.

## 2019-03-30 NOTE — ED Notes (Signed)
Pt's ride  Byrd Hesselbach [phone]

## 2019-03-30 NOTE — ED Provider Notes (Signed)
[facility] EMERGENCY DEPARTMENT Provider Note   CSN: [id number] Arrival date & time: 03/30/19  1138     History Chief Complaint  Patient presents with  . Atrial Fibrillation    Tracey Lin is a 59 y.o. female history of PAF, GERD, OSA.  Patient sent in today from [facility] clinic for cardioversion.  Patient reports that around 11-11:30 PM last night she developed palpitations which she reports feels similar to previous episodes of atrial fibrillation.  She reports that this caused her to feel anxious and short of breath which lasted for a few minutes before self resolving.  She reports that around 2 AM her palpitations began to improve and she was able to go to sleep.  When she woke up palpitations continued and she went to A. fib clinic, she reports she was sent here for cardioversion today.  She denies any recent illness, fever/chills, headache/vision changes, chest pain, cough/hemoptysis, shortness of breath, abdominal pain, nausea/vomiting, diarrhea, extremity swelling/color change or any additional concerns.  HPI     Past Medical History:  Diagnosis Date  . Anxiety   . B12 deficiency   . Difficult intubation    "SMALL TRACHEA"  . Dysrhythmia   . GERD (gastroesophageal reflux disease)   . History of kidney stones    H/O  . OSA (obstructive sleep apnea)    USES CPAP ( rarely)  . Palpitations   . Paroxysmal atrial fibrillation [facility])     Patient Active Problem List   Diagnosis Date Noted  . Snoring 11/21/2017  . OSA (obstructive sleep apnea) 10/12/2017  . Anemia, unspecified 08/22/2017  . Panic attacks 08/22/2017  . Plantar fasciitis 08/22/2017  . SBO (small bowel obstruction) ([place]) 08/18/2017  . Small bowel obstruction ([place]) 08/17/2017  . Obesity 08/10/2017  . Atrial fibrillation with RVR ([place]) 08/10/2017  . Chronic insomnia 05/24/2017  . Intermittent palpitations 04/06/2016  . OSA on CPAP 04/06/2016  . Postmenopausal 04/06/2016  . Anxiety  07/21/2015  . Paroxysmal atrial fibrillation ([place]) 07/21/2015  . Postmenopausal vaginal bleeding 02/27/2014  . Deficiency of other specified B group vitamins 07/21/2013    Past Surgical History:  Procedure Laterality Date  . AUGMENTATION MAMMAPLASTY  2003  . BREAST BIOPSY Right 2005   benign  . COLONOSCOPY    . DILATATION & CURETTAGE/HYSTEROSCOPY WITH MYOSURE N/A 01/02/2018   Procedure: Martin, polypectomy;  Surgeon: Benjaman Kindler, MD;  Location: [facility] ORS;  Service: Gynecology;  Laterality: N/A;  . ESOPHAGOGASTRODUODENOSCOPY    . KNEE ARTHROSCOPY Left 12/02/2017   Procedure: ARTHROSCOPY KNEE WITH PARTIAL MEDIAL MENISECTOMY, REMOVAL OF LOOSE BODY;  Surgeon: Leim Fabry, MD;  Location: [facility] ORS;  Service: Orthopedics;  Laterality: Left;  . REDUCTION MAMMAPLASTY  2007  . TUMMY TUCK  2006     OB History    Gravida  2   Para  2   Term      Preterm      AB      Living        SAB      TAB      Ectopic      Multiple      Live Births              Family History  Problem Relation Age of Onset  . CAD Mother   . Heart disease Mother   . Pancreatic cancer Father     Social History   Tobacco Use  . Smoking status:  Never Smoker  . Smokeless tobacco: Never Used  Substance Use Topics  . Alcohol use: Yes    Alcohol/week: 1.0 standard drinks    Types: 1 Glasses of wine per week  . Drug use: Never    Home Medications Prior to Admission medications   Medication Sig Start Date End Date Taking? Authorizing Provider  ALPRAZolam Prudy Feeler) 0.5 MG tablet Take 0.5-1 tablets (0.25-0.5 mg total) by mouth at bedtime as needed for anxiety. 08/15/18  Yes Dohmeier, Porfirio Mylar, MD  aspirin EC 81 MG tablet Take 81 mg by mouth at bedtime.   Yes [provider]  atenolol (TENORMIN) 50 MG tablet TAKE 1 TABLET (50 MG TOTAL) BY MOUTH EVERY EVENING. PLEASE MAKE YEARLY APPT WITH DR. ALLRED FOR JULY FOR FUTURE REFILLS. 1ST ATTEMPT Patient  taking differently: Take 50 mg by mouth every evening.  09/21/18  Yes Allred, Fayrene Fearing, MD  apixaban (ELIQUIS) 5 MG TABS tablet Take 1 tablet (5 mg total) by mouth 2 (two) times daily. 03/30/19   Newman Nip, NP    Allergies    Codeine and Ivp dye [iodinated diagnostic agents]  Review of Systems   Review of Systems Ten systems are reviewed and are negative for acute change except as noted in the HPI  Physical Exam Updated Vital Signs BP 110/75   Pulse 73   Temp 98.1 F (36.7 C) (Oral)   Resp 17   Ht 5\' 5"  (1.651 m)   Wt 100.7 kg   LMP 08/09/2011 (Approximate) Comment: PT STILL SPOTS AS OF 08-08-17  SpO2 98%   BMI 36.94 kg/m   Physical Exam Constitutional:      General: She is not in acute distress.    Appearance: Normal appearance. She is well-developed. She is not ill-appearing or diaphoretic.  HENT:     Head: Normocephalic and atraumatic.     Right Ear: External ear normal.     Left Ear: External ear normal.     Nose: Nose normal.  Eyes:     General: Vision grossly intact. Gaze aligned appropriately.     Pupils: Pupils are equal, round, and reactive to light.  Neck:     Trachea: Trachea and phonation normal. No tracheal deviation.  Cardiovascular:     Rate and Rhythm: Tachycardia present. Rhythm irregular.     Pulses: Normal pulses.  Pulmonary:     Effort: Pulmonary effort is normal. No respiratory distress.     Breath sounds: Normal breath sounds.  Abdominal:     General: There is no distension.     Palpations: Abdomen is soft.     Tenderness: There is no abdominal tenderness. There is no guarding or rebound.  Musculoskeletal:        General: No tenderness. Normal range of motion.     Cervical back: Normal range of motion.     Right lower leg: No edema.     Left lower leg: No edema.  Skin:    General: Skin is warm and dry.  Neurological:     Mental Status: She is alert.     GCS: GCS eye subscore is 4. GCS verbal subscore is 5. GCS motor subscore is 6.      Comments: Speech is clear and goal oriented, follows commands Major Cranial nerves without deficit, no facial droop Moves extremities without ataxia, coordination intact  Psychiatric:        Behavior: Behavior normal.     ED Results / Procedures / Treatments   Labs (all labs ordered  are listed, but only abnormal results are displayed) Labs Reviewed  BASIC METABOLIC PANEL - Abnormal; Notable for the following components:      Result Value   Glucose, Bld 103 (*)    All other components within normal limits  CBC    EKG EKG Interpretation  Date/Time:  Friday March 30 2019 14:39:05 EST Ventricular Rate:  75 PR Interval:    QRS Duration: 88 QT Interval:  403 QTC Calculation: 451 R Axis:   19 Text Interpretation: Sinus rhythm Baseline wander in lead(s) V3 Confirmed by Benjiman Core [phone]) on 03/30/2019 2:45:11 PM   Radiology No results found.  Procedures .Cardioversion  Date/Time: 03/30/2019 3:13 PM Performed by: Bill Salinas, PA-C Authorized by: Bill Salinas, PA-C   Consent:    Consent obtained:  Verbal   Consent given by:  Patient   Risks discussed:  Cutaneous burn, death, induced arrhythmia and pain Pre-procedure details:    Cardioversion basis:  Emergent   Rhythm:  Atrial fibrillation   Electrode placement:  Anterior-posterior Attempt one:    Cardioversion mode:  Synchronous   Waveform:  Biphasic   Shock (Joules):  200   Shock outcome:  Conversion to normal sinus rhythm Post-procedure details:    Patient status:  Awake   Patient tolerance of procedure:  Tolerated well, no immediate complications Comments:     Sedation performed by Dr. Rubin Payor. Procedure supervised by Dr. Rubin Payor.    .Critical Care Performed by: Bill Salinas, PA-C Authorized by: Bill Salinas, PA-C   Critical care provider statement:    Critical care time (minutes):  35   Critical care was time spent personally by me on the following activities:  Discussions  with consultants, evaluation of patient's response to treatment, examination of patient, ordering and performing treatments and interventions, ordering and review of laboratory studies, ordering and review of radiographic studies, pulse oximetry, re-evaluation of patient's condition, obtaining history from patient or surrogate, review of old charts and development of treatment plan with patient or surrogate   (including critical care time)  Medications Ordered in ED Medications  etomidate (AMIDATE) injection 5 mg (5 mg Intravenous Given 03/30/19 1431)  etomidate (AMIDATE) injection ( Intravenous Canceled Entry 03/30/19 1431)    ED Course  I have reviewed the triage vital signs and the nursing notes.  Pertinent labs & imaging results that were available during my care of the patient were reviewed by me and considered in my medical decision making (see chart for details).    MDM Rules/Calculators/A&P                     59 year old female presents today for atrial fibrillation with RVR from A. fib clinic for cardioversion.  Symptoms began around 11-11:30 PM last night.  She denies any current chest pain or shortness of breath.  No recent illnesses.  Reports that she is otherwise feeling well aside from having palpitations.  Chart review, cardiology note from today at A. fib clinic.  There are no also shows patient's symptom onset late last night A. fib at 138 bpm.  CHA2DS2-VASc of 1.  Note confirmed she was sent here for urgent cardioversion as this is less than 48 hours into onset of current episode.  Her home medications were refilled by cardiology, they have placed her on Eliquis 5 mg twice daily x4 weeks and will have her continue her atenolol.  Discussed case with Dr. Rubin Payor, basic blood work ordered CBC and BMP. - CBC without  leukocytosis to suggest infection and no evidence of anemia.  BMP without emergent electrolyte derangement or kidney injury. No indication for further blood work or  further imaging at this time. - Cardioversion performed as above with sedation with etomidate.  Successful cardioversion into normal sinus rhythm.  Patient reports that she is feeling well immediately post procedure has no complaints.  Reports resolution of symptoms. - 3:15 PM: Patient reassessed resting comfortably in bed no acute distress heart rate 68 bpm on monitor.  She reports that she would like to go home at this time, discharge paperwork provided, she is aware to pick up her prescriptions from pharmacy and to follow-up with cardiology.  At this time there does not appear to be any evidence of an acute emergency medical condition and the patient appears stable for discharge with appropriate outpatient follow up. Diagnosis was discussed with patient who verbalizes understanding of care plan and is agreeable to discharge. I have discussed return precautions with patient who verbalizes understanding of return precautions. Patient encouraged to follow-up with their PCP and cardiology. All questions answered.  Case rediscussed with Dr. Rubin Payor who agrees with discharge and outpatient follow-up at this time.  Note: Portions of this report may have been transcribed using voice recognition software. Every effort was made to ensure accuracy; however, inadvertent computerized transcription errors may still be present. Final Clinical Impression(s) / ED Diagnoses Final diagnoses:  Atrial fibrillation with RVR [facility])    Rx / DC Orders ED Discharge Orders    None       Elizabeth Palau 03/30/19 1523    Benjiman Core, MD 03/31/19 1526

## 2019-03-30 NOTE — Progress Notes (Signed)
Primary Care Physician: Leotis Shames, MD Referring Physician:  F/u hospitalization  [facility] hospital     Tracey Lin is a 59 y.o. female with a h/o paroxysmal afib, sleep apnea, treated with CPAP that is in the afib clinic for f/u hospitalization for afib while in [place], [place] to attend a[place]. She was walking to her car and  was aware of increased HR, dizziness and lightheadedness and was taken to [facility].  She had been out in the sun for about 30 mins and her fluid intake had been less than usual  for the day. Her HR was 170 bpm. She was thought to have mild dehydration.She was admitted, placed on Cardizem drip for a couple of days then started on Multaq, ranexa, atenolol, eliquis and did convert to SR prior to discharge. The MD told pt that she would need quick f/u as multaq was for short term as it could cause liver failure. She is planning to see Dr. Johney Frame soon. She would like to discuss coming off drugs and a possible ablation down the line. CHA2DS2VASc score is 1 for female. She would like to get clearance to have her knee surgery as she wants this first so she can get back to exercising to obtain wight loss. Echo was done at [facility] and with normal results( all records can be found in Care Everywhere).   She has had afib for many years but it had been quiet, treated in the [place] area until she moved to [place] 6 years ago. She saw Dr. Darrold Junker a couple of times but has not seen him since 2015.  She was suppose to have knee surgery last week but it was cancelled.  She states that she has gained  40 lbs over the last 6 years. She does use cpap, but still sleeps very poorly. Feels that she does get at least 4 hours a night use.  F/u in afib clinic 9/9. When she saw Dr. Johney Frame, he advised her to start weaning off the ranexa, multaq and DOAC which she has successfully done. She  feels well. No further afib. She did have a SBO which self released right after I  saw her last in clinic. No further reoccurrence.   F/u in afib clinic 02/22/18, as she had a ER visit for afib and went to the ER for a cardioversion. She had multaq and eliquis on hand and took these but did not help to restore SR.  The cardioversion was successful and she is in SR today. She still would prefer to stay off daily meds . She has talked to Dr. Johney Frame about an ablation in the past but is still not quite ready for this either unless afib burden increases. She is seeing a weight loss specialist. She has not used her cpap for sleep apnea in some time, cannot tolerate mask. She thinks the trigger was lack of sleep and working long hours.   F/u in AF clinic 07/11/18. Patient reports that last night she began having feelings of SOB, weakness, and heart racing. In office today she is in afib with RVR and is very SOB, especially when walking. She has not passed out but has also been very lightheaded. She is not on Eliquis or Multaq at this time.  She was sent to the ER for urgent cardioversion.  F/u afib clinic, 07/21/18. She is s/p successful  CV and is staying in rhythm. She remains on Multaq and Eliquis but does not want  to stay on daily long term. She cannot tolerate the cpap and states that she sleeps poorly. She has stopped going to the weight loss MD as it was inconvenient for her in [place]. She has gained weight with Covid and working from home.     F/u in afib clinic, 03/30/19. She  had return of rapid afib last night with difficulty breathing and lightheadedness and asked to be seen today. She  in in afib at 138 bpm. BP soft around 100 systolic.  She is not on anticoagulation  for a CHA2DS2VASc score of 1. Last time I saw her in June she required an urgent cardioversion. Was on Multaq for a while but stopped in August 2020. She takes anticoagulation for the 4 weeks after cardioversion but then stops drug per protocol.  She did have an episode last week for a few hours of elevated HR but self  converted. Discussed with Dr. Johney Frame and he felt she should proceed to the ER for an urgent cardioversion as she is not on anticoagulation and is in the 48 hours of onset afib. Has not used cpap for OSA in past.  Today, she denies symptoms of palpitations, chest pain,+ shortness of breath/lightedness, no orthopnea, PND, lower extremity edema, dizziness, presyncope, syncope, or neurologic sequela. The patient is tolerating medications without difficulties and is otherwise without complaint today.   Past Medical History:  Diagnosis Date  . Anxiety   . B12 deficiency   . Difficult intubation    "SMALL TRACHEA"  . Dysrhythmia   . GERD (gastroesophageal reflux disease)   . History of kidney stones    H/O  . OSA (obstructive sleep apnea)    USES CPAP ( rarely)  . Palpitations   . Paroxysmal atrial fibrillation [facility])    Past Surgical History:  Procedure Laterality Date  . AUGMENTATION MAMMAPLASTY  2003  . BREAST BIOPSY Right 2005   benign  . COLONOSCOPY    . DILATATION & CURETTAGE/HYSTEROSCOPY WITH MYOSURE N/A 01/02/2018   Procedure: DILATATION & CURETTAGE/HYSTEROSCOPY WITH MYOSURE, polypectomy;  Surgeon: Christeen Douglas, MD;  Location: [facility] ORS;  Service: Gynecology;  Laterality: N/A;  . ESOPHAGOGASTRODUODENOSCOPY    . KNEE ARTHROSCOPY Left 12/02/2017   Procedure: ARTHROSCOPY KNEE WITH PARTIAL MEDIAL MENISECTOMY, REMOVAL OF LOOSE BODY;  Surgeon: Signa Kell, MD;  Location: [facility] ORS;  Service: Orthopedics;  Laterality: Left;  . REDUCTION MAMMAPLASTY  2007  . TUMMY TUCK  2006    Current Outpatient Medications  Medication Sig Dispense Refill  . ALPRAZolam (XANAX) 0.5 MG tablet Take 0.5-1 tablets (0.25-0.5 mg total) by mouth at bedtime as needed for anxiety. 30 tablet 0  . aspirin EC 81 MG tablet Take 81 mg by mouth daily.    Marland Kitchen atenolol (TENORMIN) 50 MG tablet TAKE 1 TABLET (50 MG TOTAL) BY MOUTH EVERY EVENING. PLEASE MAKE YEARLY APPT WITH DR. ALLRED FOR JULY FOR FUTURE REFILLS. 1ST  ATTEMPT 90 tablet 3  . omeprazole (PRILOSEC) 20 MG capsule Take 20 mg by mouth daily as needed (heartburn).     No current facility-administered medications for this encounter.    Allergies  Allergen Reactions  . Codeine Nausea And Vomiting  . Ivp Dye [Iodinated Diagnostic Agents] Itching    Social History   Socioeconomic History  . Marital status: Divorced    Spouse name: Not on file  . Number of children: Not on file  . Years of education: Not on file  . Highest education level: Not on file  Occupational History  .  Occupation: realtor  Tobacco Use  . Smoking status: Never Smoker  . Smokeless tobacco: Never Used  Substance and Sexual Activity  . Alcohol use: Yes    Alcohol/week: 1.0 standard drinks    Types: 1 Glasses of wine per week  . Drug use: Never  . Sexual activity: Not on file  Other Topics Concern  . 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 Difficulty of Paying Living Expenses: Not on file  Food Insecurity:   . Worried About Programme researcher, broadcasting/film/video in the Last Year: Not on file  . Ran Out of Food in the Last Year: Not on file  Transportation Needs:   . Lack of Transportation (Medical): Not on file  . Lack of Transportation (Non-Medical): Not on file  Physical Activity:   . Days of Exercise per Week: Not on file  . Minutes of Exercise per Session: Not on file  Stress:   . Feeling of Stress : Not on file  Social Connections:   . Frequency of Communication with Friends and Family: Not on file  . Frequency of Social Gatherings with Friends and Family: Not on file  . Attends Religious Services: Not on file  . Active Member of Clubs or Organizations: Not on file  . Attends Banker Meetings: Not on file  . Marital Status: Not on file  Intimate Partner Violence:   . Fear of Current or Ex-Partner:  Not on file  . Emotionally Abused: Not on file  . Physically Abused: Not on file  . Sexually Abused: Not on file    Family History  Problem Relation Age of Onset  . CAD Mother   . Heart disease Mother   . Pancreatic cancer Father     ROS- All systems are reviewed and negative except as per the HPI above  Physical Exam: Vitals:   03/30/19 1047  BP: 102/70  Pulse: (!) 138  Weight: 100.7 kg  Height: 5' 5.5" (1.664 m)   Wt Readings from Last 3 Encounters:  03/30/19 100.7 kg  09/19/18 100.2 kg  07/21/18 99.3 kg    Labs: Lab Results  Component Value Date   NA 141 07/11/2018   K 4.1 07/11/2018   CL 108 07/11/2018   CO2 27 07/11/2018   GLUCOSE 109 (H) 07/11/2018   BUN 14 07/11/2018   CREATININE 0.81 07/11/2018   CALCIUM 9.0 07/11/2018   MG 2.0 02/18/2018   Lab Results  Component Value Date   INR 1.13 08/17/2017   Lab Results  Component Value Date   CHOL 220 (H) 02/07/2018   HDL 59 02/07/2018   LDLCALC 136 (H) 02/07/2018   TRIG 123 02/07/2018     GEN- The patient is well appearing, alert and oriented x 3 today.   Head- normocephalic, atraumatic Eyes-  Sclera clear, conjunctiva pink Ears- hearing intact Oropharynx- clear Neck- supple, no JVP Lymph- no cervical lymphadenopathy Lungs- Clear to ausculation bilaterally, normal work of breathing Heart- Rapid irregular rate and rhythm, no murmurs, rubs or gallops, PMI not laterally displaced GI- soft, NT, ND, + BS Extremities- no clubbing, cyanosis, or edema MS- no significant deformity or atrophy Skin- no rash or lesion Psych- euthymic mood, full affect Neuro- strength and sensation are intact  EKG-rapid afib at 138 bpm Epic records reviewed Care Everywhere  Assessment and Plan: 1.Symptomatic  Paroxysmal afib with RVR Started last night  Not on daily anticoagulation due to CHA2DS2VASc score of 1 (female)  I discussed with Dr. [name] and will send to the ER for urgent cardioversion, she is in the 48  hour window  She  will go back on eliquis 5 mg bid for the 4 weeks following cardioversion Rx sent to her pharmacy  Continue atenolol 50 mg qd  To ER I will see back one week after cardioversion I will then send her back to Dr. [name] to discuss ablation as she does not like to take daily antiarrythmic's   Butch Penny C. Itali Mckendry, [facility] [address] [place], [place] [address] [phone]

## 2019-03-30 NOTE — ED Notes (Signed)
Patient verbalizes understanding of discharge instructions. Opportunity for questioning and answers were provided. Pt discharged from ED. 

## 2019-03-30 NOTE — Discharge Instructions (Addendum)
You have been diagnosed today with atrial fibrillation with rapid ventricular response.  At this time there does not appear to be the presence of an emergent medical condition, however there is always the potential for conditions to change. Please read and follow the below instructions.  Please return to the Emergency Department immediately for any new or worsening symptoms. Please be sure to follow up with your Primary Care Provider within one week regarding your visit today; please call their office to schedule an appointment even if you are feeling better for a follow-up visit. Please take all your home medications as prescribed by your primary care provider and cardiologist.  Your medications have been refilled by your cardiologist be sure to fill them when you leave here today.  Please drink plenty water and get plenty of rest.  Return to the ER for any new/worsening or recurrence of symptoms.  Get Help Right Away If: You have pain in your chest or your belly (abdomen). You have trouble breathing. You have side effects of blood thinners, such as blood in your vomit, poop (stool), or pee (urine), or bleeding that cannot stop. You have any signs of a stroke. "BE FAST" is an easy way to remember the main warning signs: B - Balance. Signs are dizziness, sudden trouble walking, or loss of balance. E - Eyes. Signs are trouble seeing or a change in how you see. F - Face. Signs are sudden weakness or loss of feeling in the face, or the face or eyelid drooping on one side. A - Arms. Signs are weakness or loss of feeling in an arm. This happens suddenly and usually on one side of the body. S - Speech. Signs are sudden trouble speaking, slurred speech, or trouble understanding what people say. T - Time. Time to call emergency services. Write down what time symptoms started. You have other signs of a stroke, such as: A sudden, very bad headache with no known cause. Feeling like you may vomit  (nausea). Vomiting. A seizure.  Please read the additional information packets attached to your discharge summary.  Do not take your medicine if  develop an itchy rash, swelling in your mouth or lips, or difficulty breathing; call 911 and seek immediate emergency medical attention if this occurs.  Note: Portions of this text may have been transcribed using voice recognition software. Every effort was made to ensure accuracy; however, inadvertent computerized transcription errors may still be present.

## 2019-03-31 NOTE — ED Provider Notes (Signed)
  Physical Exam  BP 110/75   Pulse 73   Temp 98.1 F (36.7 C) (Oral)   Resp 17   Ht 5\' 5"  (1.651 m)   Wt 100.7 kg   LMP 08/09/2011 (Approximate) Comment: PT STILL SPOTS AS OF 08-08-17  SpO2 98%   BMI 36.94 kg/m   Physical Exam  ED Course/Procedures     .Sedation  Date/Time: 03/30/2019 2:57 PM Performed by: 05/30/2019, MD Authorized by: Benjiman Core, MD   Consent:    Consent obtained:  Verbal   Consent given by:  Patient   Risks discussed:  Inadequate sedation, allergic reaction, dysrhythmia, prolonged hypoxia resulting in organ damage, respiratory compromise necessitating ventilatory assistance and intubation, vomiting and nausea Universal protocol:    Immediately prior to procedure a time out was called: yes     Patient identity confirmation method:  Arm band and verbally with patient Indications:    Procedure performed:  Cardioversion Pre-sedation assessment:    Time since last food or drink:  4 hrs   ASA classification: class 2 - patient with mild systemic disease     Neck mobility: normal     Mouth opening:  2 finger widths   Thyromental distance:  3 finger widths   Mallampati score:  III - soft palate, base of uvula visible   Pre-sedation assessments completed and reviewed: airway patency, cardiovascular function, hydration status, mental status, respiratory function and temperature     Pre-sedation assessments completed and reviewed: pre-procedure nausea and vomiting status not reviewed and pre-procedure pain level not reviewed     History of difficult intubation: yes   Immediate pre-procedure details:    Reassessment: Patient reassessed immediately prior to procedure     Reviewed: vital signs     Verified: bag valve mask available, emergency equipment available, intubation equipment available, IV patency confirmed and oxygen available   Procedure details (see MAR for exact dosages):    Preoxygenation:  Nasal cannula   Sedation:  Etomidate   Intended  level of sedation: deep   Intra-procedure monitoring:  Blood pressure monitoring, cardiac monitor, continuous pulse oximetry, frequent LOC assessments and frequent vital sign checks   Intra-procedure events: none     Total Provider sedation time (minutes):  10 Post-procedure details:    Attendance: Constant attendance by certified staff until patient recovered     Recovery: Patient returned to pre-procedure baseline     Post-sedation assessments completed and reviewed: airway patency     Patient is stable for discharge or admission: yes     Patient tolerance:  Tolerated well, no immediate complications Comments:     Patient reportedly has a narrow airway and if intubation required would need 6.5 mm tube.           Benjiman Core, MD 03/31/19 1500

## 2019-04-06 ENCOUNTER — Ambulatory Visit (HOSPITAL_COMMUNITY): Payer: BLUE CROSS/BLUE SHIELD | Admitting: Nurse Practitioner

## 2019-04-09 ENCOUNTER — Ambulatory Visit (HOSPITAL_COMMUNITY): Payer: BLUE CROSS/BLUE SHIELD | Admitting: Nurse Practitioner

## 2019-04-13 ENCOUNTER — Other Ambulatory Visit: Payer: Self-pay

## 2019-04-13 ENCOUNTER — Ambulatory Visit (HOSPITAL_COMMUNITY)
Admission: RE | Admit: 2019-04-13 | Discharge: 2019-04-13 | Disposition: A | Discharge status: Home / Self Care | Payer: BLUE CROSS/BLUE SHIELD | Source: Ambulatory Visit | Attending: Nurse Practitioner | Admitting: Nurse Practitioner

## 2019-04-13 ENCOUNTER — Encounter (HOSPITAL_COMMUNITY): Payer: Self-pay | Admitting: Nurse Practitioner

## 2019-04-13 VITALS — BP 130/78 | HR 60 | Ht 65.0 in | Wt 224.6 lb

## 2019-04-13 DIAGNOSIS — D6869 Other thrombophilia: Secondary | ICD-10-CM | POA: Diagnosis not present

## 2019-04-13 DIAGNOSIS — I480 Paroxysmal atrial fibrillation: Secondary | ICD-10-CM | POA: Diagnosis not present

## 2019-04-13 DIAGNOSIS — K219 Gastro-esophageal reflux disease without esophagitis: Secondary | ICD-10-CM | POA: Insufficient documentation

## 2019-04-13 DIAGNOSIS — Z7901 Long term (current) use of anticoagulants: Secondary | ICD-10-CM | POA: Diagnosis not present

## 2019-04-13 DIAGNOSIS — Z7982 Long term (current) use of aspirin: Secondary | ICD-10-CM | POA: Insufficient documentation

## 2019-04-13 DIAGNOSIS — Z79899 Other long term (current) drug therapy: Secondary | ICD-10-CM | POA: Insufficient documentation

## 2019-04-13 DIAGNOSIS — G4733 Obstructive sleep apnea (adult) (pediatric): Secondary | ICD-10-CM | POA: Diagnosis not present

## 2019-04-13 MED ORDER — ATENOLOL 25 MG PO TABS: 25.0000 mg | ORAL_TABLET | Freq: Two times a day (BID) | ORAL | 3 refills | Status: DC

## 2019-04-13 NOTE — Progress Notes (Signed)
Primary Care Physician: Glendon Axe, MD Referring Physician:  F/u hospitalization  [facility] hospital     Tracey Lin is a 59 y.o. female with a h/o paroxysmal afib, sleep apnea, treated with CPAP that is in the afib clinic for f/u hospitalization for afib while in [place], [place] to attend a [place] rally in 2019. She was walking to her car and  was aware of increased HR, dizziness and lightheadedness and was taken to [facility].  She had been out in the sun for about 30 mins and her fluid intake had been less than usual  for the day. Her HR was 170 bpm. She was thought to have mild dehydration.She was admitted, placed on Cardizem drip for a couple of days then started on Multaq, ranexa, atenolol, eliquis and did convert to SR prior to discharge. The MD told pt that she would need quick f/u as multaq was for short term as it could cause liver failure. She is planning to see Dr. [name] soon. She would like to discuss coming off drugs and a possible ablation down the line. CHA2DS2VASc score is 1 for female. She would like to get clearance to have her knee surgery as she wants this first so she can get back to exercising to obtain wight loss. Echo was done at [facility] and with normal results( all records can be found in [place]).   She has had afib for many years but it had been quiet, treated in the [place] area until she moved to [place] 6 years ago. She saw Dr. [name] a couple of times but has not seen him since 2015.  She was suppose to have knee surgery last week but it was cancelled.  She states that she has gained  40 lbs over the last 6 years. She does use cpap, but still sleeps very poorly. Feels that she does get at least 4 hours a night use.  F/u in afib clinic 9/9. When she saw Dr. [name], he advised her to start weaning off the ranexa, multaq and DOAC which she has successfully done. She  feels well. No further afib. She did have a SBO which self released right  after I saw her last in clinic. No further reoccurrence.   F/u in afib clinic 02/22/18, as she had a ER visit for afib and went to the ER for a cardioversion. She had multaq and eliquis on hand and took these but did not help to restore SR.  The cardioversion was successful and she is in SR today. She still would prefer to stay off daily meds . She has talked to Dr. [name] about an ablation in the past but is still not quite ready for this either unless afib burden increases. She is seeing a weight loss specialist. She has not used her cpap for sleep apnea in some time, cannot tolerate mask. She thinks the trigger was lack of sleep and working long hours.   F/u in AF clinic 07/11/18. Patient reports that last night she began having feelings of SOB, weakness, and heart racing. In office today she is in afib with RVR and is very SOB, especially when walking. She has not passed out but has also been very lightheaded. She is not on Eliquis or Multaq at this time.  She was sent to the ER for urgent cardioversion.  F/u afib clinic, 07/21/18. She is s/p successful  CV and is staying in rhythm. She remains on Multaq and Eliquis but  does not want to stay on daily long term. She cannot tolerate the cpap and states that she sleeps poorly. She has stopped going to the weight loss MD as it was inconvenient for her in [place]. She has gained weight with Covid and working from home.   F/u in afib clinic, 03/30/19. She  had return of rapid afib last night with difficulty breathing and lightheadedness and asked to be seen today. She  in in afib at 138 bpm. BP soft around 100 systolic.  She is not on anticoagulation  for a CHA2DS2VASc score of 1. Last time I saw her in June she required an urgent cardioversion. Was on Multaq for a while but stopped in August 2020. She takes anticoagulation for the 4 weeks after cardioversion but then stops drug per protocol.  She did have an episode last week for a few hours of elevated HR but self  converted. Discussed with Dr. Johney Frame and he felt she should proceed to the ER for an urgent cardioversion as she is not on anticoagulation and is in the 48 hours of onset afib. Has not used cpap for OSA in past.  F/u in afib clinic, 04/13/19.  after successful cardioversion in the ER. She continues on eliquis 5 mg bid for post cardioversion protocol. Discussion today if she prefers to go back on Multaq bid to help prevent afib as she has had 2 cardioversions 6 months apart, or if she wants to discuss ablation with Dr. Johney Frame. She prefers to discuss with Dr. Johney Frame as she does not like to take meds. Will need updated echo.   Today, she denies symptoms of palpitations, chest pain,+ shortness of breath/lightedness, no orthopnea, PND, lower extremity edema, dizziness, presyncope, syncope, or neurologic sequela. The patient is tolerating medications without difficulties and is otherwise without complaint today.   Past Medical History:  Diagnosis Date  . Anxiety   . B12 deficiency   . Difficult intubation    "SMALL TRACHEA"  . Dysrhythmia   . GERD (gastroesophageal reflux disease)   . History of kidney stones    H/O  . OSA (obstructive sleep apnea)    USES CPAP ( rarely)  . Palpitations   . Paroxysmal atrial fibrillation [facility])    Past Surgical History:  Procedure Laterality Date  . AUGMENTATION MAMMAPLASTY  2003  . BREAST BIOPSY Right 2005   benign  . COLONOSCOPY    . DILATATION & CURETTAGE/HYSTEROSCOPY WITH MYOSURE N/A 01/02/2018   Procedure: DILATATION & CURETTAGE/HYSTEROSCOPY WITH MYOSURE, polypectomy;  Surgeon: Christeen Douglas, MD;  Location: [facility] ORS;  Service: Gynecology;  Laterality: N/A;  . ESOPHAGOGASTRODUODENOSCOPY    . KNEE ARTHROSCOPY Left 12/02/2017   Procedure: ARTHROSCOPY KNEE WITH PARTIAL MEDIAL MENISECTOMY, REMOVAL OF LOOSE BODY;  Surgeon: Signa Kell, MD;  Location: [facility] ORS;  Service: Orthopedics;  Laterality: Left;  . REDUCTION MAMMAPLASTY  2007  . TUMMY TUCK  2006     Current Outpatient Medications  Medication Sig Dispense Refill  . ALPRAZolam (XANAX) 0.5 MG tablet Take 0.5-1 tablets (0.25-0.5 mg total) by mouth at bedtime as needed for anxiety. 30 tablet 0  . apixaban (ELIQUIS) 5 MG TABS tablet Take 1 tablet (5 mg total) by mouth 2 (two) times daily. 60 tablet 3  . atenolol (TENORMIN) 50 MG tablet TAKE 1 TABLET (50 MG TOTAL) BY MOUTH EVERY EVENING. PLEASE MAKE YEARLY APPT WITH DR. ALLRED FOR JULY FOR FUTURE REFILLS. 1ST ATTEMPT (Patient taking differently: Take 50 mg by mouth every evening. ) 90 tablet 3  .  omeprazole (PRILOSEC) 40 MG capsule Take 40 mg by mouth daily.    Marland Kitchen aspirin EC 81 MG tablet Take 81 mg by mouth at bedtime.     No current facility-administered medications for this encounter.    Allergies  Allergen Reactions  . Codeine Nausea And Vomiting  . Ivp Dye [Iodinated Diagnostic Agents] Itching    Social History   Socioeconomic History  . Marital status: Divorced    Spouse name: Not on file  . Number of children: Not on file  . Years of education: Not on file  . Highest education level: Not on file  Occupational History  . Occupation: realtor  Tobacco Use  . Smoking status: Never Smoker  . Smokeless tobacco: Never Used  Substance and Sexual Activity  . Alcohol use: Yes    Alcohol/week: 1.0 standard drinks    Types: 1 Glasses of wine per week  . Drug use: Never  . Sexual activity: Not on file  Other Topics Concern  . 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 Difficulty of Paying Living Expenses:   Food Insecurity:   . Worried About Programme researcher, broadcasting/film/video in the Last Year:   . Barista in the Last Year:   Transportation Needs:   . Freight forwarder (Medical):   Marland Kitchen Lack of Transportation (Non-Medical):   Physical Activity:   . Days of Exercise per Week:    . Minutes of Exercise per Session:   Stress:   . Feeling of Stress :   Social Connections:   . Frequency of Communication with Friends and Family:   . Frequency of Social Gatherings with Friends and Family:   . Attends Religious Services:   . Active Member of Clubs or Organizations:   . Attends Banker Meetings:   Marland Kitchen Marital Status:   Intimate Partner Violence:   . Fear of Current or Ex-Partner:   . Emotionally Abused:   Marland Kitchen Physically Abused:   . Sexually Abused:     Family History  Problem Relation Age of Onset  . CAD Mother   . Heart disease Mother   . Pancreatic cancer Father     ROS- All systems are reviewed and negative except as per the HPI above  Physical Exam: Vitals:   04/13/19 1131  Weight: 101.9 kg  Height: 5\' 5"  (1.651 m)   Wt Readings from Last 3 Encounters:  04/13/19 101.9 kg  03/30/19 100.7 kg  03/30/19 100.7 kg    Labs: Lab Results  Component Value Date   NA 140 03/30/2019   K 4.3 03/30/2019   CL 107 03/30/2019   CO2 23 03/30/2019   GLUCOSE 103 (H) 03/30/2019   BUN 11 03/30/2019   CREATININE 0.77 03/30/2019   CALCIUM 8.9 03/30/2019   MG 2.0 02/18/2018   Lab Results  Component Value Date   INR 1.13 08/17/2017   Lab Results  Component Value Date   CHOL 220 (H) 02/07/2018   HDL 59 02/07/2018   LDLCALC 136 (H) 02/07/2018   TRIG 123 02/07/2018     GEN- The patient is well appearing, alert and oriented x 3 today.   Head- normocephalic, atraumatic Eyes-  Sclera clear, conjunctiva pink Ears- hearing intact Oropharynx- clear Neck- supple, no JVP Lymph- no cervical lymphadenopathy Lungs- Clear to  ausculation bilaterally, normal work of breathing Heart-  regular rate and rhythm, no murmurs, rubs or gallops, PMI not laterally displaced GI- soft, NT, ND, + BS Extremities- no clubbing, cyanosis, or edema MS- no significant deformity or atrophy Skin- no rash or lesion Psych- euthymic mood, full affect Neuro- strength and  sensation are intact  EKG- NSR at 60 bpm, pr int 134 ms, qrs int 88 ms, qtc 454 ms Epic records reviewed Care Everywhere    Assessment and Plan: 1.Symptomatic  paroxysmal afib with RVR Has needed 2 urgent cardioversion's in the last year, has high v rates with soft BP's making rate control difficult   Continue  anticoagulation due to CHA2DS2VASc score of 1 (female) for 4 weeks after cardioversion and then stop per protocol   Update echo and will get an appointment with Dr. Johney Frame to discuss antiarrythmic vrs ablation She  has taken Multaq in the past, did not want to continue daily AAD, stopped several months ago Continue  atenolol 50 mg but change to 25 mg bid for better coverage  F/u with Dr. Johney Frame afib clinic  as needed  Elvina Sidle. [facility] [address] [place], [place] [address] [phone]

## 2019-04-13 NOTE — Patient Instructions (Signed)
Atenolol 25mg  twice a day  Dr office will call for follow up appointment after your echocardiogram.

## 2019-04-18 ENCOUNTER — Ambulatory Visit (HOSPITAL_COMMUNITY): Admission: RE | Admit: 2019-04-18 | Payer: BLUE CROSS/BLUE SHIELD | Source: Ambulatory Visit

## 2019-06-08 ENCOUNTER — Other Ambulatory Visit (HOSPITAL_COMMUNITY): Payer: Self-pay | Admitting: Nurse Practitioner

## 2019-09-02 ENCOUNTER — Other Ambulatory Visit (HOSPITAL_COMMUNITY): Payer: Self-pay | Admitting: Nurse Practitioner

## 2019-09-28 ENCOUNTER — Emergency Department (HOSPITAL_COMMUNITY)
Admission: EM | Admit: 2019-09-28 | Discharge: 2019-09-28 | Disposition: A | Discharge status: Home / Self Care | Payer: 59 | Attending: Emergency Medicine | Admitting: Emergency Medicine

## 2019-09-28 ENCOUNTER — Emergency Department (HOSPITAL_COMMUNITY): Payer: 59

## 2019-09-28 ENCOUNTER — Encounter (HOSPITAL_COMMUNITY): Payer: Self-pay

## 2019-09-28 ENCOUNTER — Other Ambulatory Visit: Payer: Self-pay

## 2019-09-28 DIAGNOSIS — I4891 Unspecified atrial fibrillation: Secondary | ICD-10-CM | POA: Insufficient documentation

## 2019-09-28 DIAGNOSIS — R002 Palpitations: Secondary | ICD-10-CM | POA: Diagnosis present

## 2019-09-28 LAB — BASIC METABOLIC PANEL
Anion gap: 8 (ref 5–15)
BUN: 14 mg/dL (ref 6–20)
CO2: 25 mmol/L (ref 22–32)
Calcium: 9.4 mg/dL (ref 8.9–10.3)
Chloride: 106 mmol/L (ref 98–111)
Creatinine, Ser: 0.94 mg/dL (ref 0.44–1.00)
GFR calc Af Amer: >60 mL/min (ref >60)
GFR calc non Af Amer: >60 mL/min (ref >60)
Glucose, Bld: 162 mg/dL — ABNORMAL HIGH (ref 70–99)
Potassium: 4.3 mmol/L (ref 3.5–5.1)
Sodium: 139 mmol/L (ref 135–145)

## 2019-09-28 LAB — CBC
HCT: 43.9 % (ref 36.0–46.0)
Hemoglobin: 13.4 g/dL (ref 12.0–15.0)
MCH: 28.1 pg (ref 26.0–34.0)
MCHC: 30.5 g/dL (ref 30.0–36.0)
MCV: 92 fL (ref 80.0–100.0)
Platelets: 367 10*3/uL (ref 150–400)
RBC: 4.77 MIL/uL (ref 3.87–5.11)
RDW: 13.2 % (ref 11.5–15.5)
WBC: 9.1 10*3/uL (ref 4.0–10.5)
nRBC: 0 % (ref 0.0–0.2)

## 2019-09-28 LAB — I-STAT BETA HCG BLOOD, ED (MC, WL, AP ONLY): I-stat hCG, quantitative: 11.9 m[IU]/mL — ABNORMAL HIGH (ref <5)

## 2019-09-28 MED ORDER — ETOMIDATE 2 MG/ML IV SOLN: 5.0000 mg | Freq: Once | INTRAVENOUS | Status: DC

## 2019-09-28 MED ORDER — SODIUM CHLORIDE 0.9 % IV BOLUS
1000.0000 mL | Freq: Once | INTRAVENOUS | Status: AC
  Administered 2019-09-28: 1000 mL via INTRAVENOUS

## 2019-09-28 MED ORDER — APIXABAN 5 MG PO TABS: 5.0000 mg | ORAL_TABLET | Freq: Two times a day (BID) | ORAL | 0 refills | Status: DC

## 2019-09-28 MED ORDER — ETOMIDATE 2 MG/ML IV SOLN
10.0000 mg | Freq: Once | INTRAVENOUS | Status: AC
  Administered 2019-09-28: 5 mg via INTRAVENOUS
  Filled 2019-09-28: qty 10

## 2019-09-28 NOTE — ED Triage Notes (Addendum)
Pt presents with HR 150, approx 0900 pt reports she set down and felt like her BP dropped and she was going to faint. Pt took her prescribed atenolol and xanax but unable to convert. Pt reports increase stress at work. See Dr. Johney Frame, needs to schedule an cardiac ablation.   Pt also reports chest tightness, SOB and dizziness.

## 2019-09-28 NOTE — ED Provider Notes (Signed)
.Sedation  Date/Time: 09/28/2019 1:52 PM Performed by: Pricilla Loveless, MD Authorized by: Pricilla Loveless, MD   Consent:    Consent obtained:  Verbal and written   Consent given by:  Patient   Risks discussed:  Allergic reaction, dysrhythmia, inadequate sedation, nausea, prolonged hypoxia resulting in organ damage, prolonged sedation necessitating reversal, respiratory compromise necessitating ventilatory assistance and intubation and vomiting   Alternatives discussed:  Analgesia without sedation, anxiolysis and regional anesthesia Universal protocol:    Procedure explained and questions answered to patient or proxy's satisfaction: yes     Relevant documents present and verified: yes     Test results available and properly labeled: yes     Imaging studies available: yes     Required blood products, implants, devices, and special equipment available: yes     Site/side marked: yes     Immediately prior to procedure a time out was called: yes     Patient identity confirmation method:  Verbally with patient Indications:    Procedure necessitating sedation performed by:  Physician performing sedation Pre-sedation assessment:    Time since last food or drink:  2.5 hours   ASA classification: class 2 - patient with mild systemic disease     Neck mobility: normal     Mouth opening:  3 or more finger widths   Thyromental distance:  4 finger widths   Mallampati score:  I - soft palate, uvula, fauces, pillars visible   Pre-sedation assessments completed and reviewed: airway patency, cardiovascular function, hydration status, mental status, nausea/vomiting, pain level, respiratory function and temperature   Immediate pre-procedure details:    Reassessment: Patient reassessed immediately prior to procedure     Reviewed: vital signs, relevant labs/tests and NPO status     Verified: bag valve mask available, emergency equipment available, intubation equipment available, IV patency confirmed, oxygen  available and suction available   Procedure details (see MAR for exact dosages):    Preoxygenation:  Nasal cannula   Sedation:  Propofol   Intended level of sedation: deep   Intra-procedure monitoring:  Blood pressure monitoring, cardiac monitor, continuous pulse oximetry, frequent LOC assessments, frequent vital sign checks and continuous capnometry   Intra-procedure events: none     Total Provider sedation time (minutes):  7 Post-procedure details:    Attendance: Constant attendance by certified staff until patient recovered     Recovery: Patient returned to pre-procedure baseline     Post-sedation assessments completed and reviewed: airway patency, cardiovascular function, hydration status, mental status, nausea/vomiting, pain level, respiratory function and temperature     Patient is stable for discharge or admission: yes     Patient tolerance:  Tolerated well, no immediate complications .Cardioversion  Date/Time: 09/28/2019 1:53 PM Performed by: Pricilla Loveless, MD Authorized by: Pricilla Loveless, MD   Consent:    Consent obtained:  Verbal and written   Consent given by:  Patient and parent   Risks discussed:  Cutaneous burn, death and induced arrhythmia   Alternatives discussed:  Rate-control medication Pre-procedure details:    Cardioversion basis:  Emergent   Rhythm:  Atrial fibrillation   Electrode placement:  Anterior-posterior Patient sedated: Yes. Refer to sedation procedure documentation for details of sedation.  Attempt one:    Cardioversion mode:  Synchronous   Shock (Joules):  200   Shock outcome:  Conversion to normal sinus rhythm Post-procedure details:    Patient status:  Awake   Patient tolerance of procedure:  Tolerated well, no immediate complications  Pricilla Loveless, MD 09/28/19 1356

## 2019-09-28 NOTE — ED Provider Notes (Signed)
[facility] EMERGENCY DEPARTMENT Provider Note   CSN: [id number] Arrival date & time: 09/28/19  1216     History Chief Complaint  Patient presents with  . Tachycardia    Tracey Lin is a 59 y.o. female.  The history is provided by the patient and medical records. No language interpreter was used.     59 year old female with history of persistent atrial fibrillation, anxiety, B12 deficiency, presenting to the ED for evaluation of her palpitation.  Patient reports she woke up this morning and got out of bed very quickly.  She then noticed sensation of heart beating fast, feeling lightheadedness as if she is going to faint follows with shortness of breath and some tightness in her chest.  Symptoms felt very similar to prior A. fib with RVR.  She believes her current atenolol, Xanax, and her baby aspirin but noticed no significant improvement prompting this ER visit.  She does report having increased stress at work.  She denies any increased use of caffeine compared to her usual 1 cup a day.  She denies any significant medication changes.  States she has plantar fasciitis for the past several months was given some anti-inflammatory medication recently.  She is not on Eliquis.  She has had cardioversion in the past.  Patient states she was experience 2-3 episodes of A. fib with RVR per year but it seems to be increasing in frequency.  Cardiologist, Dr. Johney Frame, did recommend cardiac ablation in the future.   Past Medical History:  Diagnosis Date  . Anxiety   . B12 deficiency   . Difficult intubation    "SMALL TRACHEA"  . Dysrhythmia   . GERD (gastroesophageal reflux disease)   . History of kidney stones    H/O  . OSA (obstructive sleep apnea)    USES CPAP ( rarely)  . Palpitations   . Paroxysmal atrial fibrillation [facility])     Patient Active Problem List   Diagnosis Date Noted  . Snoring 11/21/2017  . OSA (obstructive sleep apnea) 10/12/2017  . Anemia,  unspecified 08/22/2017  . Panic attacks 08/22/2017  . Plantar fasciitis 08/22/2017  . SBO (small bowel obstruction) (HCC) 08/18/2017  . Small bowel obstruction (HCC) 08/17/2017  . Obesity 08/10/2017  . Atrial fibrillation with RVR (HCC) 08/10/2017  . Chronic insomnia 05/24/2017  . Intermittent palpitations 04/06/2016  . OSA on CPAP 04/06/2016  . Postmenopausal 04/06/2016  . Anxiety 07/21/2015  . Paroxysmal atrial fibrillation (HCC) 07/21/2015  . Postmenopausal vaginal bleeding 02/27/2014  . Deficiency of other specified B group vitamins 07/21/2013    Past Surgical History:  Procedure Laterality Date  . AUGMENTATION MAMMAPLASTY  2003  . BREAST BIOPSY Right 2005   benign  . COLONOSCOPY    . DILATATION & CURETTAGE/HYSTEROSCOPY WITH MYOSURE N/A 01/02/2018   Procedure: DILATATION & CURETTAGE/HYSTEROSCOPY WITH MYOSURE, polypectomy;  Surgeon: Christeen Douglas, MD;  Location: [facility] ORS;  Service: Gynecology;  Laterality: N/A;  . ESOPHAGOGASTRODUODENOSCOPY    . KNEE ARTHROSCOPY Left 12/02/2017   Procedure: ARTHROSCOPY KNEE WITH PARTIAL MEDIAL MENISECTOMY, REMOVAL OF LOOSE BODY;  Surgeon: Signa Kell, MD;  Location: [facility] ORS;  Service: Orthopedics;  Laterality: Left;  . REDUCTION MAMMAPLASTY  2007  . TUMMY TUCK  2006     OB History    Gravida  2   Para  2   Term      Preterm      AB      Living        SAB  TAB      Ectopic      Multiple      Live Births              Family History  Problem Relation Age of Onset  . CAD Mother   . Heart disease Mother   . Pancreatic cancer Father     Social History   Tobacco Use  . Smoking status: Never Smoker  . Smokeless tobacco: Never Used  Vaping Use  . Vaping Use: Never used  Substance Use Topics  . Alcohol use: Yes    Alcohol/week: 1.0 standard drink    Types: 1 Glasses of wine per week  . Drug use: Never    Home Medications Prior to Admission medications   Medication Sig Start Date End Date Taking?  Authorizing Provider  ALPRAZolam Prudy Feeler) 0.5 MG tablet Take 0.5-1 tablets (0.25-0.5 mg total) by mouth at bedtime as needed for anxiety. 08/15/18  Yes Dohmeier, Porfirio Mylar, MD  atenolol (TENORMIN) 25 MG tablet Take 1 tablet (25 mg total) by mouth 2 (two) times daily. Please make yearly appt with Dr. Johney Frame for future refills. 1st attempt 09/03/19  Yes Allred, Fayrene Fearing, MD  meloxicam (MOBIC) 15 MG tablet Take 1 tablet by mouth daily as needed. 09/19/19 10/19/19 Yes [provider]  omeprazole (PRILOSEC) 40 MG capsule Take 40 mg by mouth daily as needed.  03/16/19  Yes [provider]  apixaban (ELIQUIS) 5 MG TABS tablet Take 1 tablet (5 mg total) by mouth 2 (two) times daily. Patient not taking: Reported on 09/28/2019 03/30/19   Newman Nip, NP    Allergies    Codeine and Ivp dye [iodinated diagnostic agents]  Review of Systems   Review of Systems  All other systems reviewed and are negative.   Physical Exam Updated Vital Signs BP (!) 118/108 (BP Location: Right Arm)   Pulse 75   Temp 98.4 F (36.9 C) (Oral)   Resp (!) 24   Ht 5' 5.5" (1.664 m)   Wt 95.3 kg   LMP 08/09/2011 (Approximate) Comment: PT STILL SPOTS AS OF 08-08-17  SpO2 99%   BMI 34.41 kg/m   Physical Exam Vitals and nursing note reviewed.  Constitutional:      General: She is not in acute distress.    Appearance: She is well-developed. She is obese.  HENT:     Head: Atraumatic.  Eyes:     Conjunctiva/sclera: Conjunctivae normal.  Cardiovascular:     Rate and Rhythm: Rhythm irregular.     Heart sounds: No murmur heard.  No friction rub. No gallop.   Pulmonary:     Effort: Pulmonary effort is normal.     Breath sounds: Normal breath sounds. No wheezing, rhonchi or rales.  Abdominal:     Palpations: Abdomen is soft.     Tenderness: There is no abdominal tenderness.  Musculoskeletal:     Cervical back: Neck supple.  Skin:    Capillary Refill: Capillary refill takes less than 2 seconds.     Findings:  No rash.  Neurological:     Mental Status: She is alert and oriented to person, place, and time.     GCS: GCS eye subscore is 4. GCS verbal subscore is 5. GCS motor subscore is 6.  Psychiatric:        Mood and Affect: Mood normal.     ED Results / Procedures / Treatments   Labs (all labs ordered are listed, but only abnormal results are displayed) Labs  Reviewed  BASIC METABOLIC PANEL - Abnormal; Notable for the following components:      Result Value   Glucose, Bld 162 (*)    All other components within normal limits  I-STAT BETA HCG BLOOD, ED ([facility], [facility], [facility] ONLY) - Abnormal; Notable for the following components:   I-stat hCG, quantitative 11.9 (*)    All other components within normal limits  CBC    EKG EKG Interpretation  Date/Time:  Friday September 28 2019 12:20:45 EDT Ventricular Rate:  152 PR Interval:    QRS Duration: 72 QT Interval:  302 QTC Calculation: 480 R Axis:   1 Text Interpretation: Atrial fibrillation with rapid ventricular response Nonspecific ST and T wave abnormality Abnormal ECG Confirmed by Pricilla Loveless [phone]) on 09/28/2019 12:41:37 PM   Radiology DG Chest 2 View  Result Date: 09/28/2019 CLINICAL DATA:  Chest pain. EXAM: CHEST - 2 VIEW COMPARISON:  02/18/2018. FINDINGS: Mediastinum and hilar structures normal. Lungs are clear. No pleural effusion or pneumothorax. Heart size normal. No acute bony abnormality. IMPRESSION: No acute cardiopulmonary disease. Electronically Signed   By: Maisie Fus  Register   On: 09/28/2019 12:46    Procedures .Critical Care Performed by: Fayrene Helper, PA-C Authorized by: Fayrene Helper, PA-C   Critical care provider statement:    Critical care time (minutes):  30   Critical care was time spent personally by me on the following activities:  Discussions with consultants, evaluation of patient's response to treatment, examination of patient, ordering and performing treatments and interventions, ordering and review of laboratory  studies, ordering and review of radiographic studies, pulse oximetry, re-evaluation of patient's condition, obtaining history from patient or surrogate and review of old charts   (including critical care time)  Medications Ordered in ED Medications  sodium chloride 0.9 % bolus 1,000 mL (1,000 mLs Intravenous New Bag/Given 09/28/19 1323)  etomidate (AMIDATE) injection 10 mg (5 mg Intravenous Given 09/28/19 1348)    ED Course  I have reviewed the triage vital signs and the nursing notes.  Pertinent labs & imaging results that were available during my care of the patient were reviewed by me and considered in my medical decision making (see chart for details).    MDM Rules/Calculators/A&P                          BP 140/70   Pulse 73   Temp 98.4 F (36.9 C) (Oral)   Resp 10   Ht 5' 5.5" (1.664 m)   Wt 95.3 kg   LMP 08/09/2011 (Approximate) Comment: PT STILL SPOTS AS OF 08-08-17  SpO2 100%   BMI 34.41 kg/m   Final Clinical Impression(s) / ED Diagnoses Final diagnoses:  Atrial fibrillation with RVR (HCC)    Rx / DC Orders ED Discharge Orders         Ordered    apixaban (ELIQUIS) 5 MG TABS tablet  2 times daily        09/28/19 1356         1:06 PM Patient with history of paroxysmal atrial fibrillation here with complaints of lightheadedness, dizziness, shortness of breath, hospitalization.  Patient was found to be in atrial fibrillation with RVR.,  Heart rates in the 150s.  She has been seen in the ED multiple times in the past requiring cardioversion to convert.  She is in the window for cardioversion.  Care discussed with Dr. Criss Alvine.   2:21 PM Labs are reassuring.  Pregnancy test obtained and  shown an i-STAT hCG of 11.9.  This is likely false negative as patient is postmenopausal.  Patient amenable for cardioversion as her symptom has been less than 48 hours.  CHA2DS2-VASc was 1.  Successful cardioversion performed by Dr. Criss AlvineGoldston.  Patient tolerates well.  Plan to Eliquis 5  milligrams twice daily x 1 month and to have patient follow-up closely with clinic for further management.   Fayrene Helperran, Suhaila Troiano, PA-C 09/28/19 1509    Pricilla LovelessGoldston, Scott, MD 09/28/19 [phone]

## 2019-09-28 NOTE — Discharge Instructions (Addendum)
Please start taking Eliquis as prescribed.  Call and follow up with A.fib clinic or with Dr. Johney Frame for further care. Return if you have any concerns.

## 2019-10-05 ENCOUNTER — Other Ambulatory Visit (HOSPITAL_COMMUNITY): Payer: Self-pay | Admitting: Internal Medicine

## 2019-10-08 ENCOUNTER — Ambulatory Visit (INDEPENDENT_AMBULATORY_CARE_PROVIDER_SITE_OTHER): Payer: 59 | Admitting: Internal Medicine

## 2019-10-08 ENCOUNTER — Encounter: Payer: Self-pay | Admitting: Internal Medicine

## 2019-10-08 ENCOUNTER — Other Ambulatory Visit: Payer: Self-pay

## 2019-10-08 VITALS — BP 124/78 | HR 62 | Ht 65.5 in | Wt 224.0 lb

## 2019-10-08 DIAGNOSIS — I480 Paroxysmal atrial fibrillation: Secondary | ICD-10-CM | POA: Diagnosis not present

## 2019-10-08 DIAGNOSIS — G4730 Sleep apnea, unspecified: Secondary | ICD-10-CM | POA: Diagnosis not present

## 2019-10-08 NOTE — Patient Instructions (Addendum)
Medication Instructions:  Your physician recommends that you continue on your current medications as directed. Please refer to the Current Medication list given to you today.  *If you need a refill on your cardiac medications before your next appointment, please call your pharmacy*  Lab Work: None ordered.  If you have labs (blood work) drawn today and your tests are completely normal, you will receive your results only by:  MyChart Message (if you have MyChart) OR  A paper copy in the mail If you have any lab test that is abnormal or we need to change your treatment, we will call you to review the results.  Testing/Procedures: None ordered.  Follow-Up: At [facility], you and your health needs are our priority.  As part of our continuing mission to provide you with exceptional heart care, we have created designated Provider Care Teams.  These Care Teams include your primary Cardiologist (physician) and Advanced Practice Providers (APPs -  Physician Assistants and Nurse Practitioners) who all work together to provide you with the care you need, when you need it.  We recommend signing up for the patient portal called "MyChart".  Sign up information is provided on this After Visit Summary.  MyChart is used to connect with patients for Virtual Visits (Telemedicine).  Patients are able to view lab/test results, encounter notes, upcoming appointments, etc.  Non-urgent messages can be sent to your provider as well.   To learn more about what you can do with MyChart, go to [email or web address].    Your next appointment:   Your physician wants you to follow-up in: 3 months with the Afib Clinic. You will receive a reminder letter in the mail two months in advance. If you don't receive a letter, please call our office to schedule the follow-up appointment.    Other Instructions: Your physician discussed the importance of regular exercise and recommended that you start or continue a regular  exercise program for good health.   DASH Eating Plan DASH stands for "Dietary Approaches to Stop Hypertension." The DASH eating plan is a healthy eating plan that has been shown to reduce high blood pressure (hypertension). It may also reduce your risk for type 2 diabetes, heart disease, and stroke. The DASH eating plan may also help with weight loss. What are tips for following this plan?  General guidelines  Avoid eating more than 2,300 mg (milligrams) of salt (sodium) a day. If you have hypertension, you may need to reduce your sodium intake to 1,500 mg a day.  Limit alcohol intake to no more than 1 drink a day for nonpregnant women and 2 drinks a day for men. One drink equals 12 oz of beer, 5 oz of wine, or 1 oz of hard liquor.  Work with your health care provider to maintain a healthy body weight or to lose weight. Ask what an ideal weight is for you.  Get at least 30 minutes of exercise that causes your heart to beat faster (aerobic exercise) most days of the week. Activities may include walking, swimming, or biking.  Work with your health care provider or diet and nutrition specialist (dietitian) to adjust your eating plan to your individual calorie needs. Reading food labels   Check food labels for the amount of sodium per serving. Choose foods with less than 5 percent of the Daily Value of sodium. Generally, foods with less than 300 mg of sodium per serving fit into this eating plan.  To find whole grains, look for the word "  whole" as the first word in the ingredient list. Shopping  Buy products labeled as "low-sodium" or "no salt added."  Buy fresh foods. Avoid canned foods and premade or frozen meals. Cooking  Avoid adding salt when cooking. Use salt-free seasonings or herbs instead of table salt or sea salt. Check with your health care provider or pharmacist before using salt substitutes.  Do not fry foods. Cook foods using healthy methods such as baking, boiling,  grilling, and broiling instead.  Cook with heart-healthy oils, such as olive, canola, soybean, or sunflower oil. Meal planning  Eat a balanced diet that includes: ? 5 or more servings of fruits and vegetables each day. At each meal, try to fill half of your plate with fruits and vegetables. ? Up to 6-8 servings of whole grains each day. ? Less than 6 oz of lean meat, poultry, or fish each day. A 3-oz serving of meat is about the same size as a deck of cards. One egg equals 1 oz. ? 2 servings of low-fat dairy each day. ? A serving of nuts, seeds, or beans 5 times each week. ? Heart-healthy fats. Healthy fats called Omega-3 fatty acids are found in foods such as flaxseeds and coldwater fish, like sardines, salmon, and mackerel.  Limit how much you eat of the following: ? Canned or prepackaged foods. ? Food that is high in trans fat, such as fried foods. ? Food that is high in saturated fat, such as fatty meat. ? Sweets, desserts, sugary drinks, and other foods with added sugar. ? Full-fat dairy products.  Do not salt foods before eating.  Try to eat at least 2 vegetarian meals each week.  Eat more home-cooked food and less restaurant, buffet, and fast food.  When eating at a restaurant, ask that your food be prepared with less salt or no salt, if possible. What foods are recommended? The items listed may not be a complete list. Talk with your dietitian about what dietary choices are best for you. Grains Whole-grain or whole-wheat bread. Whole-grain or whole-wheat pasta. Brown rice. Orpah Cobb. Bulgur. Whole-grain and low-sodium cereals. Pita bread. Low-fat, low-sodium crackers. Whole-wheat flour tortillas. Vegetables Fresh or frozen vegetables (raw, steamed, roasted, or grilled). Low-sodium or reduced-sodium tomato and vegetable juice. Low-sodium or reduced-sodium tomato sauce and tomato paste. Low-sodium or reduced-sodium canned vegetables. Fruits All fresh, dried, or frozen  fruit. Canned fruit in natural juice (without added sugar). Meat and other protein foods Skinless chicken or [place]. Ground chicken or [place]. Pork with fat trimmed off. Fish and seafood. Egg whites. Dried beans, peas, or lentils. Unsalted nuts, nut butters, and seeds. Unsalted canned beans. Lean cuts of beef with fat trimmed off. Low-sodium, lean deli meat. Dairy Low-fat (1%) or fat-free (skim) milk. Fat-free, low-fat, or reduced-fat cheeses. Nonfat, low-sodium ricotta or cottage cheese. Low-fat or nonfat yogurt. Low-fat, low-sodium cheese. Fats and oils Soft margarine without trans fats. Vegetable oil. Low-fat, reduced-fat, or light mayonnaise and salad dressings (reduced-sodium). Canola, safflower, olive, soybean, and sunflower oils. Avocado. Seasoning and other foods Herbs. Spices. Seasoning mixes without salt. Unsalted popcorn and pretzels. Fat-free sweets. What foods are not recommended? The items listed may not be a complete list. Talk with your dietitian about what dietary choices are best for you. Grains Baked goods made with fat, such as croissants, muffins, or some breads. Dry pasta or rice meal packs. Vegetables Creamed or fried vegetables. Vegetables in a cheese sauce. Regular canned vegetables (not low-sodium or reduced-sodium). Regular canned tomato sauce and  paste (not low-sodium or reduced-sodium). Regular tomato and vegetable juice (not low-sodium or reduced-sodium). Rosita Fire. Olives. Fruits Canned fruit in a light or heavy syrup. Fried fruit. Fruit in cream or butter sauce. Meat and other protein foods Fatty cuts of meat. Ribs. Fried meat. Tomasa Blase. Sausage. Bologna and other processed lunch meats. Salami. Fatback. Hotdogs. Bratwurst. Salted nuts and seeds. Canned beans with added salt. Canned or smoked fish. Whole eggs or egg yolks. Chicken or [place] with skin. Dairy Whole or 2% milk, cream, and half-and-half. Whole or full-fat cream cheese. Whole-fat or sweetened yogurt. Full-fat  cheese. Nondairy creamers. Whipped toppings. Processed cheese and cheese spreads. Fats and oils Butter. Stick margarine. Lard. Shortening. Ghee. Bacon fat. Tropical oils, such as coconut, palm kernel, or palm oil. Seasoning and other foods Salted popcorn and pretzels. Onion salt, garlic salt, seasoned salt, table salt, and sea salt. Worcestershire sauce. Tartar sauce. Barbecue sauce. Teriyaki sauce. Soy sauce, including reduced-sodium. Steak sauce. Canned and packaged gravies. Fish sauce. Oyster sauce. Cocktail sauce. Horseradish that you find on the shelf. Ketchup. Mustard. Meat flavorings and tenderizers. Bouillon cubes. Hot sauce and Tabasco sauce. Premade or packaged marinades. Premade or packaged taco seasonings. Relishes. Regular salad dressings. Where to find more information:  National Heart, Lung, and Blood Institute: PopSteam.is  American Heart Association: www.heart.org Summary  The DASH eating plan is a healthy eating plan that has been shown to reduce high blood pressure (hypertension). It may also reduce your risk for type 2 diabetes, heart disease, and stroke.  With the DASH eating plan, you should limit salt (sodium) intake to 2,300 mg a day. If you have hypertension, you may need to reduce your sodium intake to 1,500 mg a day.  When on the DASH eating plan, aim to eat more fresh fruits and vegetables, whole grains, lean proteins, low-fat dairy, and heart-healthy fats.  Work with your health care provider or diet and nutrition specialist (dietitian) to adjust your eating plan to your individual calorie needs. This information is not intended to replace advice given to you by your health care provider. Make sure you discuss any questions you have with your health care provider. Document Revised: 12/24/2016 Document Reviewed: 01/05/2016 Elsevier Patient Education  2020 [employer].

## 2019-10-08 NOTE — Progress Notes (Signed)
PCP: Patrice Paradise, MD   Primary EP: Dr Johney Frame  Tracey Lin is a 59 y.o. female who presents today for routine electrophysiology followup.  Since last being seen in our clinic, the patient reports doing very well.  She has had increasing afib over the past year with several ER visits and cardioversions required.  She thinks that stress may be a trigger.  Today, she denies symptoms of palpitations, chest pain, shortness of breath,  lower extremity edema, dizziness, presyncope, or syncope.  The patient is otherwise without complaint today.   Past Medical History:  Diagnosis Date  . Anxiety   . B12 deficiency   . Difficult intubation    "SMALL TRACHEA"  . Dysrhythmia   . GERD (gastroesophageal reflux disease)   . History of kidney stones    H/O  . OSA (obstructive sleep apnea)    USES CPAP ( rarely)  . Palpitations   . Paroxysmal atrial fibrillation [facility])    Past Surgical History:  Procedure Laterality Date  . AUGMENTATION MAMMAPLASTY  2003  . BREAST BIOPSY Right 2005   benign  . COLONOSCOPY    . DILATATION & CURETTAGE/HYSTEROSCOPY WITH MYOSURE N/A 01/02/2018   Procedure: DILATATION & CURETTAGE/HYSTEROSCOPY WITH MYOSURE, polypectomy;  Surgeon: Christeen Douglas, MD;  Location: [facility] ORS;  Service: Gynecology;  Laterality: N/A;  . ESOPHAGOGASTRODUODENOSCOPY    . KNEE ARTHROSCOPY Left 12/02/2017   Procedure: ARTHROSCOPY KNEE WITH PARTIAL MEDIAL MENISECTOMY, REMOVAL OF LOOSE BODY;  Surgeon: Signa Kell, MD;  Location: [facility] ORS;  Service: Orthopedics;  Laterality: Left;  . REDUCTION MAMMAPLASTY  2007  . TUMMY TUCK  2006    ROS- all systems are reviewed and negatives except as per HPI above  Current Outpatient Medications  Medication Sig Dispense Refill  . ALPRAZolam (XANAX) 0.5 MG tablet Take 0.5-1 tablets (0.25-0.5 mg total) by mouth at bedtime as needed for anxiety. 30 tablet 0  . apixaban (ELIQUIS) 5 MG TABS tablet Take 1 tablet (5 mg total) by mouth 2 (two) times  daily. 60 tablet 0  . atenolol (TENORMIN) 25 MG tablet Take 1 tablet (25 mg total) by mouth 2 (two) times daily. PLEASE KEEP UPCOMING APPT FOR FURTHER REFILLS 180 tablet 0  . omeprazole (PRILOSEC) 40 MG capsule Take 40 mg by mouth daily as needed.      No current facility-administered medications for this visit.    Physical Exam: Vitals:   10/08/19 1213  BP: 124/78  Pulse: 62  SpO2: 96%  Weight: 224 lb (101.6 kg)  Height: 5' 5.5" (1.664 m)    GEN- The patient is well appearing, alert and oriented x 3 today.   Head- normocephalic, atraumatic Eyes-  Sclera clear, conjunctiva pink Ears- hearing intact Oropharynx- clear Lungs-   normal work of breathing Heart- Regular rate and rhythm  GI- soft  Extremities- no clubbing, cyanosis, or edema,  R ankle with bruise s/p fall.  No deformity,  good ROM  Wt Readings from Last 3 Encounters:  10/08/19 224 lb (101.6 kg)  09/28/19 210 lb (95.3 kg)  04/13/19 224 lb 9.6 oz (101.9 kg)    EKG tracing ordered today is personally reviewed and shows sinus rhythm  Assessment and Plan:  1. Paroxysmal atrial fibrillation chads2vasc score is 1.   The patient has symptomatic, recurrent atrial fibrillation.  She has had several recent cardioversions she has failed medical therapy with multaq.  she is anticoagulated with eliquis . Therapeutic strategies for afib including medicine and ablation were discussed in  detail with the patient today. Risk, benefits, and alternatives to EP study and radiofrequency ablation for afib were also discussed in detail today. These risks include but are not limited to stroke, bleeding, vascular damage, tamponade, perforation, damage to the esophagus, lungs, and other structures, pulmonary vein stenosis, worsening renal function, and death. The patient understands these risk and wishes to think about this further.  She will contact my office if she decides to proceed.  2. OSA Compliance with CPAP is advised  3.  Obesity Body mass index is 36.71 kg/m. Lifestyle modification is advised  Risks, benefits and potential toxicities for medications prescribed and/or refilled reviewed with patient today.   Follow-up in the AF clinic in 3 months  Hillis Range MD, [facility] 10/08/2019 12:14 PM

## 2019-10-09 NOTE — Addendum Note (Signed)
Addended by: Solon Augusta on: 10/09/2019 03:31 PM   Modules accepted: Orders

## 2019-11-02 ENCOUNTER — Telehealth (HOSPITAL_COMMUNITY): Payer: Self-pay | Admitting: *Deleted

## 2019-11-02 NOTE — Telephone Encounter (Signed)
Pt called to report she has stopped her eliquis as of this past Saturday.

## 2019-11-24 ENCOUNTER — Other Ambulatory Visit (HOSPITAL_COMMUNITY): Payer: Self-pay | Admitting: Internal Medicine

## 2020-01-07 ENCOUNTER — Other Ambulatory Visit: Payer: Self-pay

## 2020-01-07 ENCOUNTER — Ambulatory Visit (HOSPITAL_COMMUNITY)
Admission: RE | Admit: 2020-01-07 | Discharge: 2020-01-07 | Disposition: A | Discharge status: Home / Self Care | Payer: 59 | Source: Ambulatory Visit | Attending: Nurse Practitioner | Admitting: Nurse Practitioner

## 2020-01-07 VITALS — BP 126/86 | HR 58 | Ht 65.5 in | Wt 226.4 lb

## 2020-01-07 DIAGNOSIS — Z9989 Dependence on other enabling machines and devices: Secondary | ICD-10-CM | POA: Insufficient documentation

## 2020-01-07 DIAGNOSIS — G4733 Obstructive sleep apnea (adult) (pediatric): Secondary | ICD-10-CM | POA: Diagnosis not present

## 2020-01-07 DIAGNOSIS — D6869 Other thrombophilia: Secondary | ICD-10-CM

## 2020-01-07 DIAGNOSIS — Z7901 Long term (current) use of anticoagulants: Secondary | ICD-10-CM | POA: Insufficient documentation

## 2020-01-07 DIAGNOSIS — Z79899 Other long term (current) drug therapy: Secondary | ICD-10-CM | POA: Insufficient documentation

## 2020-01-07 DIAGNOSIS — I480 Paroxysmal atrial fibrillation: Secondary | ICD-10-CM | POA: Insufficient documentation

## 2020-01-07 NOTE — Progress Notes (Signed)
Primary Care Physician: Patrice Paradise, MD Referring Physician:  F/u hospitalization  [facility] hospital     Tracey Lin is a 59 y.o. female with a h/o paroxysmal afib, sleep apnea, treated with CPAP that is in the afib clinic for f/u hospitalization for afib while in [place], [place] to attend a [place] rally in 2019. She was walking to her car and  was aware of increased HR, dizziness and lightheadedness and was taken to [facility].  She had been out in the sun for about 30 mins and her fluid intake had been less than usual  for the day. Her HR was 170 bpm. She was thought to have mild dehydration.She was admitted, placed on Cardizem drip for a couple of days then started on Multaq, ranexa, atenolol, eliquis and did convert to SR prior to discharge. The MD told pt that she would need quick f/u as multaq was for short term as it could cause liver failure. She is planning to see Dr. Johney Frame soon. She would like to discuss coming off drugs and a possible ablation down the line. CHA2DS2VASc score is 1 for female. She would like to get clearance to have her knee surgery as she wants this first so she can get back to exercising to obtain wight loss. Echo was done at [facility] and with normal results( all records can be found in Care Everywhere).   She has had afib for many years but it had been quiet, treated in the [place] area until she moved to [place] 6 years ago. She saw Dr. Darrold Junker a couple of times but has not seen him since 2015.  She was suppose to have knee surgery last week but it was cancelled.  She states that she has gained  40 lbs over the last 6 years. She does use cpap, but still sleeps very poorly. Feels that she does get at least 4 hours a night use.  F/u in afib clinic 9/9. When she saw Dr. Johney Frame, he advised her to start weaning off the ranexa, multaq and DOAC which she has successfully done. She  feels well. No further afib. She did have a SBO which self released  right after I saw her last in clinic. No further reoccurrence.   F/u in afib clinic 02/22/18, as she had a ER visit for afib and went to the ER for a cardioversion. She had multaq and eliquis on hand and took these but did not help to restore SR.  The cardioversion was successful and she is in SR today. She still would prefer to stay off daily meds . She has talked to Dr. Johney Frame about an ablation in the past but is still not quite ready for this either unless afib burden increases. She is seeing a weight loss specialist. She has not used her cpap for sleep apnea in some time, cannot tolerate mask. She thinks the trigger was lack of sleep and working long hours.   F/u in AF clinic 07/11/18. Patient reports that last night she began having feelings of SOB, weakness, and heart racing. In office today she is in afib with RVR and is very SOB, especially when walking. She has not passed out but has also been very lightheaded. She is not on Eliquis or Multaq at this time.  She was sent to the ER for urgent cardioversion.  F/u afib clinic, 07/21/18. She is s/p successful  CV and is staying in rhythm. She remains on Multaq and Eliquis  but does not want to stay on daily long term. She cannot tolerate the cpap and states that she sleeps poorly. She has stopped going to the weight loss MD as it was inconvenient for her in [place]. She has gained weight with Covid and working from home.   F/u in afib clinic, 03/30/19. She  had return of rapid afib last night with difficulty breathing and lightheadedness and asked to be seen today. She  in in afib at 138 bpm. BP soft around 100 systolic.  She is not on anticoagulation  for a CHA2DS2VASc score of 1. Last time I saw her in June she required an urgent cardioversion. Was on Multaq for a while but stopped in August 2020. She takes anticoagulation for the 4 weeks after cardioversion but then stops drug per protocol.  She did have an episode last week for a few hours of elevated HR but  self converted. Discussed with Dr. Johney Frame and he felt she should proceed to the ER for an urgent cardioversion as she is not on anticoagulation and is in the 48 hours of onset afib. Has not used cpap for OSA in past.  F/u in afib clinic, 04/13/19.  after successful cardioversion in the ER. She continues on eliquis 5 mg bid for post cardioversion protocol. Discussion today if she prefers to go back on Multaq bid to help prevent afib as she has had 2 cardioversions 6 months apart, or if she wants to discuss ablation with Dr. Johney Frame. She prefers to discuss with Dr. Johney Frame as she does not like to take meds. Will need updated echo.   F/u in afib clinic, 01/07/20. She had an appointment with Dr. Johney Frame 3 months ago but no change in approach to afib management was made. She is in SR today. No  further afib episodes to report. She is in SR today. She continues on atenolol,  CHA2DS2VASc score of 1, on daily asa and off eliquis after 4 weeks following last  cardioversion. She previously took Advertising copywriter, it worked well but she did not want to continue daily meds. She continues use of CPAP. She would prefer not to pursue ablation at this time.   Today, she denies symptoms of palpitations, chest pain,+ shortness of breath/lightedness, no orthopnea, PND, lower extremity edema, dizziness, presyncope, syncope, or neurologic sequela. The patient is tolerating medications without difficulties and is otherwise without complaint today.   Past Medical History:  Diagnosis Date  . Anxiety   . B12 deficiency   . Difficult intubation    "SMALL TRACHEA"  . Dysrhythmia   . GERD (gastroesophageal reflux disease)   . History of kidney stones    H/O  . OSA (obstructive sleep apnea)    USES CPAP ( rarely)  . Palpitations   . Paroxysmal atrial fibrillation [facility])    Past Surgical History:  Procedure Laterality Date  . AUGMENTATION MAMMAPLASTY  2003  . BREAST BIOPSY Right 2005   benign  . COLONOSCOPY    . DILATATION &  CURETTAGE/HYSTEROSCOPY WITH MYOSURE N/A 01/02/2018   Procedure: DILATATION & CURETTAGE/HYSTEROSCOPY WITH MYOSURE, polypectomy;  Surgeon: Christeen Douglas, MD;  Location: [facility] ORS;  Service: Gynecology;  Laterality: N/A;  . ESOPHAGOGASTRODUODENOSCOPY    . KNEE ARTHROSCOPY Left 12/02/2017   Procedure: ARTHROSCOPY KNEE WITH PARTIAL MEDIAL MENISECTOMY, REMOVAL OF LOOSE BODY;  Surgeon: Signa Kell, MD;  Location: [facility] ORS;  Service: Orthopedics;  Laterality: Left;  . REDUCTION MAMMAPLASTY  2007  . TUMMY TUCK  2006    Current Outpatient  Medications  Medication Sig Dispense Refill  . ALPRAZolam (XANAX) 0.5 MG tablet Take 0.5-1 tablets (0.25-0.5 mg total) by mouth at bedtime as needed for anxiety. 30 tablet 0  . apixaban (ELIQUIS) 5 MG TABS tablet Take 1 tablet (5 mg total) by mouth 2 (two) times daily. 60 tablet 0  . atenolol (TENORMIN) 25 MG tablet TAKE 1 TABLET BY MOUTH TWICE A DAY. PLEASE KEEP UPCOMING APPT FOR FURTHER FILLS 180 tablet 3  . omeprazole (PRILOSEC) 40 MG capsule Take 40 mg by mouth daily as needed.      No current facility-administered medications for this encounter.    Allergies  Allergen Reactions  . Codeine Nausea And Vomiting  . Ivp Dye [Iodinated Diagnostic Agents] Itching    Social History   Socioeconomic History  . Marital status: Divorced    Spouse name: Not on file  . Number of children: Not on file  . Years of education: Not on file  . Highest education level: Not on file  Occupational History  . Occupation: realtor  Tobacco Use  . Smoking status: Never Smoker  . Smokeless tobacco: Never Used  Vaping Use  . Vaping Use: Never used  Substance and Sexual Activity  . Alcohol use: Yes    Alcohol/week: 1.0 standard drink    Types: 1 Glasses of wine per week  . Drug use: Never  . Sexual activity: Not on file  Other Topics Concern  . 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 CAD Mother   . Heart disease Mother   . Pancreatic cancer Father     ROS- All systems are reviewed and negative except as per the HPI above  Physical Exam: There were no vitals filed for this visit. Wt Readings from Last 3 Encounters:  10/08/19 101.6 kg  09/28/19 95.3 kg  04/13/19 101.9 kg    Labs: Lab Results  Component Value Date   NA 139 09/28/2019   K 4.3 09/28/2019   CL 106 09/28/2019   CO2 25 09/28/2019   GLUCOSE 162 (H) 09/28/2019   BUN 14 09/28/2019   CREATININE 0.94 09/28/2019   CALCIUM 9.4 09/28/2019   MG 2.0 02/18/2018   Lab Results  Component Value Date   INR 1.13 08/17/2017   Lab Results  Component Value Date   CHOL 220 (H) 02/07/2018   HDL 59 02/07/2018   LDLCALC 136 (H) 02/07/2018   TRIG 123 02/07/2018     GEN- The patient is well appearing, alert and oriented x 3 today.   Head- normocephalic, atraumatic Eyes-  Sclera clear, conjunctiva pink Ears- hearing intact Oropharynx- clear Neck- supple, no JVP Lymph- no cervical lymphadenopathy Lungs- Clear to ausculation bilaterally, normal work of breathing Heart-  regular rate and rhythm, no murmurs, rubs or gallops, PMI not laterally displaced GI- soft, NT, ND, + BS Extremities- no clubbing, cyanosis, or edema MS- no significant deformity or atrophy Skin- no rash or lesion Psych- euthymic mood, full affect Neuro- strength and sensation are intact  EKG-sinus brady at 58  bpm, pr int 126 ms, qrs int 86 ms, qtc 443 ms     Assessment and Plan: 1.Symptomatic  paroxysmal afib with RVR Has needed 2 urgent cardioversion's in the last year, last one in September, has high  v rates with soft BP's making rate control difficult  She still prefers watchful waiting per pursuing ablation as her afib recently has been quiet over the last 3 months   Off anticoagulation due to CHA2DS2VASc score of 1 (female)  Update echo  She  has taken Multaq in the past, did not want to continue daily AAD  Continue  Atenolol  25 mg bid   F/u in 6 months   Lupita Leash C. [facility] [address] [place], [place] [address] [phone]

## 2020-01-11 ENCOUNTER — Encounter (HOSPITAL_COMMUNITY): Payer: Self-pay | Admitting: Nurse Practitioner

## 2020-01-14 ENCOUNTER — Telehealth (HOSPITAL_COMMUNITY): Payer: Self-pay

## 2020-01-14 NOTE — Telephone Encounter (Signed)
Prior authorization form has been faxed to [employer] for her Echocardiogram to be precerted. Date of service is 01/30/2020.

## 2020-01-30 ENCOUNTER — Ambulatory Visit (HOSPITAL_COMMUNITY)
Admission: RE | Admit: 2020-01-30 | Discharge: 2020-01-30 | Disposition: A | Discharge status: Home / Self Care | Payer: 59 | Source: Ambulatory Visit | Attending: Physician Assistant | Admitting: Physician Assistant

## 2020-01-30 ENCOUNTER — Other Ambulatory Visit: Payer: Self-pay

## 2020-01-30 DIAGNOSIS — I480 Paroxysmal atrial fibrillation: Secondary | ICD-10-CM | POA: Diagnosis not present

## 2020-01-30 LAB — ECHOCARDIOGRAM COMPLETE
Area-P 1/2: 3.68 cm2
S' Lateral: 3 cm

## 2020-01-30 NOTE — Progress Notes (Signed)
  Echocardiogram 2D Echocardiogram has been performed.  Delcie Roch 01/30/2020, 11:07 AM

## 2020-02-01 ENCOUNTER — Encounter (HOSPITAL_COMMUNITY): Payer: Self-pay | Admitting: *Deleted

## 2020-02-03 ENCOUNTER — Encounter (HOSPITAL_COMMUNITY): Payer: Self-pay | Admitting: Emergency Medicine

## 2020-02-03 ENCOUNTER — Emergency Department (HOSPITAL_COMMUNITY): Payer: 59

## 2020-02-03 ENCOUNTER — Emergency Department (HOSPITAL_COMMUNITY)
Admission: EM | Admit: 2020-02-03 | Discharge: 2020-02-03 | Disposition: A | Discharge status: Home / Self Care | Payer: 59 | Attending: Emergency Medicine | Admitting: Emergency Medicine

## 2020-02-03 ENCOUNTER — Other Ambulatory Visit: Payer: Self-pay

## 2020-02-03 DIAGNOSIS — I4891 Unspecified atrial fibrillation: Secondary | ICD-10-CM | POA: Insufficient documentation

## 2020-02-03 DIAGNOSIS — Z7901 Long term (current) use of anticoagulants: Secondary | ICD-10-CM | POA: Diagnosis not present

## 2020-02-03 DIAGNOSIS — Z7982 Long term (current) use of aspirin: Secondary | ICD-10-CM | POA: Diagnosis not present

## 2020-02-03 DIAGNOSIS — R002 Palpitations: Secondary | ICD-10-CM | POA: Diagnosis present

## 2020-02-03 LAB — CBC
HCT: 41.2 % (ref 36.0–46.0)
Hemoglobin: 13.4 g/dL (ref 12.0–15.0)
MCH: 29.7 pg (ref 26.0–34.0)
MCHC: 32.5 g/dL (ref 30.0–36.0)
MCV: 91.4 fL (ref 80.0–100.0)
Platelets: 378 10*3/uL (ref 150–400)
RBC: 4.51 MIL/uL (ref 3.87–5.11)
RDW: 12.9 % (ref 11.5–15.5)
WBC: 8.4 10*3/uL (ref 4.0–10.5)
nRBC: 0 % (ref 0.0–0.2)

## 2020-02-03 LAB — TROPONIN I (HIGH SENSITIVITY)
Troponin I (High Sensitivity): 20 ng/L — ABNORMAL HIGH (ref <18)
Troponin I (High Sensitivity): 20 ng/L — ABNORMAL HIGH (ref <18)

## 2020-02-03 LAB — BASIC METABOLIC PANEL
Anion gap: 11 (ref 5–15)
BUN: 16 mg/dL (ref 6–20)
CO2: 23 mmol/L (ref 22–32)
Calcium: 8.9 mg/dL (ref 8.9–10.3)
Chloride: 104 mmol/L (ref 98–111)
Creatinine, Ser: 0.82 mg/dL (ref 0.44–1.00)
GFR, Estimated: >60 mL/min (ref >60)
Glucose, Bld: 106 mg/dL — ABNORMAL HIGH (ref 70–99)
Potassium: 4.7 mmol/L (ref 3.5–5.1)
Sodium: 138 mmol/L (ref 135–145)

## 2020-02-03 MED ORDER — ETOMIDATE 2 MG/ML IV SOLN
10.0000 mg | Freq: Once | INTRAVENOUS | Status: AC
  Administered 2020-02-03: 5 mg via INTRAVENOUS
  Filled 2020-02-03: qty 10

## 2020-02-03 MED ORDER — APIXABAN 5 MG PO TABS: 5.0000 mg | ORAL_TABLET | Freq: Two times a day (BID) | ORAL | Status: DC

## 2020-02-03 MED ORDER — APIXABAN 5 MG PO TABS: 5.0000 mg | ORAL_TABLET | Freq: Two times a day (BID) | ORAL | 0 refills | Status: DC

## 2020-02-03 MED ORDER — APIXABAN (ELIQUIS) EDUCATION KIT FOR DVT/PE PATIENTS: PACK | Freq: Once | Status: DC

## 2020-02-03 NOTE — ED Notes (Signed)
Pt does not appear in distress, respirations are even and non-labored Skin is warm, dry and intact  Denies pain but states she can feel her heart racing   Alert and Oriented x4

## 2020-02-03 NOTE — ED Notes (Signed)
DC instructions reviewed with pt.  Pt verbalized understanding and stated she would begin taking the eliquis she already has at  Home tonight until she fill script.  Pt DC.

## 2020-02-03 NOTE — ED Triage Notes (Addendum)
Pt reports Afib since 12:30am.  Reports SOB, lightheaded, and chest tightness.  Pt had echo last week and is supposed to have an ablation.  Took her medicine this morning.

## 2020-02-03 NOTE — ED Provider Notes (Signed)
[facility] EMERGENCY DEPARTMENT Provider Note   CSN: [id number] Arrival date & time: 02/03/20  1136     History Chief Complaint  Patient presents with  . Chest Pain    Tracey Lin is a 60 y.o. female.  HPI     Pt comes in with cc of CP. She has hx of AF and has undergone multiple cardioversion in the past, she is slated for ablation soon. Pt reports that she started palpitations last night.  There is associated chest discomfort.  Patient denies any recent infection, medication changes, substance abuse, stimulant use.  Patient is absolutely sure that her symptoms started yesterday, as normally she will go when she go into A. fib.  Patient has received cardioversion in the ER on multiple occasions.  Currently she is not on any blood thinners.  Past Medical History:  Diagnosis Date  . Anxiety   . B12 deficiency   . Difficult intubation    "SMALL TRACHEA"  . Dysrhythmia   . GERD (gastroesophageal reflux disease)   . History of kidney stones    H/O  . OSA (obstructive sleep apnea)    USES CPAP ( rarely)  . Palpitations   . Paroxysmal atrial fibrillation [facility])     Patient Active Problem List   Diagnosis Date Noted  . Snoring 11/21/2017  . OSA (obstructive sleep apnea) 10/12/2017  . Anemia, unspecified 08/22/2017  . Panic attacks 08/22/2017  . Plantar fasciitis 08/22/2017  . SBO (small bowel obstruction) (HCC) 08/18/2017  . Small bowel obstruction (HCC) 08/17/2017  . Obesity 08/10/2017  . Atrial fibrillation with RVR (HCC) 08/10/2017  . Chronic insomnia 05/24/2017  . Intermittent palpitations 04/06/2016  . OSA on CPAP 04/06/2016  . Postmenopausal 04/06/2016  . Anxiety 07/21/2015  . Paroxysmal atrial fibrillation (HCC) 07/21/2015  . Postmenopausal vaginal bleeding 02/27/2014  . Deficiency of other specified B group vitamins 07/21/2013    Past Surgical History:  Procedure Laterality Date  . AUGMENTATION MAMMAPLASTY  2003  . BREAST BIOPSY  Right 2005   benign  . COLONOSCOPY    . DILATATION & CURETTAGE/HYSTEROSCOPY WITH MYOSURE N/A 01/02/2018   Procedure: DILATATION & CURETTAGE/HYSTEROSCOPY WITH MYOSURE, polypectomy;  Surgeon: Christeen Douglas, MD;  Location: [facility] ORS;  Service: Gynecology;  Laterality: N/A;  . ESOPHAGOGASTRODUODENOSCOPY    . KNEE ARTHROSCOPY Left 12/02/2017   Procedure: ARTHROSCOPY KNEE WITH PARTIAL MEDIAL MENISECTOMY, REMOVAL OF LOOSE BODY;  Surgeon: Signa Kell, MD;  Location: [facility] ORS;  Service: Orthopedics;  Laterality: Left;  . REDUCTION MAMMAPLASTY  2007  . TUMMY TUCK  2006     OB History    Gravida  2   Para  2   Term      Preterm      AB      Living        SAB      IAB      Ectopic      Multiple      Live Births              Family History  Problem Relation Age of Onset  . CAD Mother   . Heart disease Mother   . Pancreatic cancer Father     Social History   Tobacco Use  . Smoking status: Never Smoker  . Smokeless tobacco: Never Used  Vaping Use  . Vaping Use: Never used  Substance Use Topics  . Alcohol use: Yes    Alcohol/week: 1.0 standard drink    Types:  1 Glasses of wine per week  . Drug use: Never    Home Medications Prior to Admission medications   Medication Sig Start Date End Date Taking? Authorizing Provider  ALPRAZolam Prudy Feeler(XANAX) 0.5 MG tablet Take 0.5-1 tablets (0.25-0.5 mg total) by mouth at bedtime as needed for anxiety. 08/15/18  Yes Dohmeier, Porfirio Mylararmen, MD  aspirin EC 81 MG tablet Take 81 mg by mouth daily.   Yes [provider]  atenolol (TENORMIN) 25 MG tablet TAKE 1 TABLET BY MOUTH TWICE A DAY. PLEASE KEEP UPCOMING APPT FOR FURTHER FILLS Patient taking differently: Take 25 mg by mouth 2 (two) times daily. 11/26/19  Yes Allred, Fayrene FearingJames, MD  apixaban (ELIQUIS) 5 MG TABS tablet Take 1 tablet (5 mg total) by mouth 2 (two) times daily. 02/03/20 03/04/20  Derwood KaplanNanavati, Smantha Boakye, MD    Allergies    Codeine and Ivp dye [iodinated diagnostic agents]  Review  of Systems   Review of Systems  Constitutional: Positive for activity change.  Respiratory: Positive for chest tightness and shortness of breath.   Cardiovascular: Positive for chest pain and palpitations.  Allergic/Immunologic: Negative for immunocompromised state.  Hematological: Does not bruise/bleed easily.  All other systems reviewed and are negative.   Physical Exam Updated Vital Signs BP 110/74   Pulse 69   Temp 98.3 F (36.8 C) (Oral)   Resp 12   LMP 08/09/2011 (Approximate) Comment: PT STILL SPOTS AS OF 08-08-17  SpO2 97%   Physical Exam Vitals and nursing note reviewed.  Constitutional:      Appearance: She is well-developed.  HENT:     Head: Normocephalic and atraumatic.  Eyes:     Extraocular Movements: EOM normal.  Cardiovascular:     Rate and Rhythm: Tachycardia present. Rhythm irregular.  Pulmonary:     Effort: Tachypnea present.  Abdominal:     General: Bowel sounds are normal.  Musculoskeletal:     Cervical back: Normal range of motion and neck supple.     Right lower leg: No edema.     Left lower leg: No edema.  Skin:    General: Skin is warm and dry.  Neurological:     Mental Status: She is alert and oriented to person, place, and time.     ED Results / Procedures / Treatments   Labs (all labs ordered are listed, but only abnormal results are displayed) Labs Reviewed  BASIC METABOLIC PANEL - Abnormal; Notable for the following components:      Result Value   Glucose, Bld 106 (*)    All other components within normal limits  TROPONIN I (HIGH SENSITIVITY) - Abnormal; Notable for the following components:   Troponin I (High Sensitivity) 20 (*)    All other components within normal limits  TROPONIN I (HIGH SENSITIVITY) - Abnormal; Notable for the following components:   Troponin I (High Sensitivity) 20 (*)    All other components within normal limits  CBC  I-STAT BETA HCG BLOOD, ED ([facility], [facility], [facility] ONLY)    EKG EKG  Interpretation  Date/Time:  Sunday February 03 2020 11:59:17 EST Ventricular Rate:  143 PR Interval:    QRS Duration: 80 QT Interval:  312 QTC Calculation: 481 R Axis:   6 Text Interpretation: Atrial fibrillation with rapid ventricular response Abnormal ECG When compared to prior, now afib with RVR. No STEMI Reconfirmed by Derwood Kaplananavati, Quention Mcneill [phone]([id number]) on 02/03/2020 1:25:22 PM   EKG Interpretation  Date/Time:  Sunday February 03 2020 14:58:04 EST Ventricular Rate:  76 PR Interval:  QRS Duration: 87 QT Interval:  395 QTC Calculation: 445 R Axis:   32 Text Interpretation: Sinus rhythm Abnormal R-wave progression, early transition No acute changes AF resolved Confirmed by Derwood Kaplan [phone]) on 02/03/2020 3:54:09 PM          Radiology DG Chest 2 View  Result Date: 02/03/2020 CLINICAL DATA:  Chest pain.  Atrial fibrillation. EXAM: CHEST - 2 VIEW COMPARISON:  September 28, 2019 FINDINGS: The heart size and mediastinal contours are within normal limits. Both lungs are clear. The visualized skeletal structures are unremarkable. IMPRESSION: No active cardiopulmonary disease. Electronically Signed   By: Gerome Sam III M.D   On: 02/03/2020 12:49    Procedures .Critical Care Performed by: Derwood Kaplan, MD Authorized by: Derwood Kaplan, MD   Critical care provider statement:    Critical care time (minutes):  40   Critical care was necessary to treat or prevent imminent or life-threatening deterioration of the following conditions:  Cardiac failure and circulatory failure   Critical care was time spent personally by me on the following activities:  Discussions with consultants, evaluation of patient's response to treatment, examination of patient, ordering and performing treatments and interventions, ordering and review of laboratory studies, ordering and review of radiographic studies, pulse oximetry, re-evaluation of patient's condition, obtaining history from patient or surrogate and  review of old charts .Sedation  Date/Time: 02/03/2020 4:02 PM Performed by: Derwood Kaplan, MD Authorized by: Derwood Kaplan, MD   Consent:    Consent obtained:  Written   Consent given by:  Patient   Risks discussed:  Allergic reaction, dysrhythmia, inadequate sedation, prolonged hypoxia resulting in organ damage, prolonged sedation necessitating reversal, respiratory compromise necessitating ventilatory assistance and intubation, nausea and vomiting Universal protocol:    Procedure explained and questions answered to patient or proxy's satisfaction: yes     Relevant documents present and verified: yes     Test results available: yes     Imaging studies available: yes     Required blood products, implants, devices, and special equipment available: yes     Site/side marked: yes     Immediately prior to procedure, a time out was called: yes     Patient identity confirmed:  Arm band Indications:    Procedure performed:  Cardioversion Pre-sedation assessment:    Time since last food or drink:  12 hours   ASA classification: class 2 - patient with mild systemic disease     Mouth opening:  3 or more finger widths   Mallampati score:  II - soft palate, uvula, fauces visible   Neck mobility: normal     Pre-sedation assessments completed and reviewed: airway patency, cardiovascular function, hydration status, mental status, nausea/vomiting and pain level     History of difficult intubation: yes     Pre-sedation assessment completed:  02/03/2020 2:03 PM Immediate pre-procedure details:    Reassessment: Patient reassessed immediately prior to procedure     Reviewed: vital signs     Verified: bag valve mask available, emergency equipment available, intubation equipment available, IV patency confirmed, oxygen available and suction available   Procedure details (see MAR for exact dosages):    Preoxygenation:  Nasal cannula   Sedation:  Etomidate   Intended level of sedation: deep    Intra-procedure monitoring:  Blood pressure monitoring, continuous capnometry, continuous pulse oximetry, cardiac monitor, frequent LOC assessments and frequent vital sign checks   Intra-procedure events: none     Total Provider sedation time (minutes):  24 Post-procedure details:  Post-sedation assessment completed:  02/03/2020 4:04 PM   Attendance: Constant attendance by certified staff until patient recovered     Recovery: Patient returned to pre-procedure baseline     Post-sedation assessments completed and reviewed: airway patency, cardiovascular function, hydration status, mental status, nausea/vomiting and pain level     Patient is stable for discharge or admission: yes     Procedure completion:  Tolerated well, no immediate complications .Cardioversion  Date/Time: 02/03/2020 4:04 PM Performed by: Derwood KaplanNanavati, Rosalie Gelpi, MD Authorized by: Derwood KaplanNanavati, Laloni Rowton, MD   Consent:    Consent obtained:  Written   Consent given by:  Patient   Risks discussed:  Cutaneous burn, induced arrhythmia, pain and death   Alternatives discussed:  No treatment Universal protocol:    Procedure explained and questions answered to patient or proxy's satisfaction: yes     Relevant documents present and verified: yes     Test results available and properly labeled: yes     Imaging studies available: yes     Required blood products, implants, devices, and special equipment available: yes     Site/side marked: yes     Immediately prior to procedure a time out was called: yes     Patient identity confirmed:  Arm band Pre-procedure details:    Cardioversion basis:  Emergent   Rhythm:  Atrial fibrillation   Electrode placement:  Anterior-posterior Patient sedated: Yes. Refer to sedation procedure documentation for details of sedation.  Attempt one:    Cardioversion mode:  Synchronous   Shock (Joules):  200   Shock outcome:  Conversion to normal sinus rhythm Post-procedure details:    Patient status:  Awake    Patient tolerance of procedure:  Tolerated well, no immediate complications   (including critical care time)  Medications Ordered in ED Medications  apixaban (ELIQUIS) tablet 5 mg (has no administration in time range)  etomidate (AMIDATE) injection 10 mg (5 mg Intravenous Given 02/03/20 1453)    ED Course  I have reviewed the triage vital signs and the nursing notes.  Pertinent labs & imaging results that were available during my care of the patient were reviewed by me and considered in my medical decision making (see chart for details).    MDM Rules/Calculators/A&P                          This patients CHA2DS2-VASc Score and unadjusted Ischemic Stroke Rate (% per year) is equal to 0.6 % stroke rate/year from a score of 1  Above score calculated as 1 point each if present [CHF, HTN, DM, Vascular=MI/PAD/Aortic Plaque, Age if 65-74, or Female] Above score calculated as 2 points each if present [Age > 75, or Stroke/TIA/TE]   60 year old female comes in a chief complaint of palpitations, chest discomfort.  Symptoms started yesterday.  She has history of paroxysmal A. fib and has undergone cardioversion multiple times in the ER, the last one being on September, 2021.  Patient is not on any anticoagulation.  She is undergoing work-up for ablation and had a recent echocardiogram which was reassuring.  Patient wants us to proceed with cardioversion as she is absolutely sure that her symptoms started last night.  Our history is not indicative of any reversible secondary cause for A. fib with RVR such as underlying infection, electrolyte abnormalities, stimulant use.  Electrocardioversion was completed without any complications. Patient is stable for discharge and she will follow up with the A. fib clinic.  Eliquis prescription has been  provided.  Final Clinical Impression(s) / ED Diagnoses Final diagnoses:  Atrial fibrillation with RVR (HCC)    Rx / DC Orders ED Discharge Orders          Ordered    Amb referral to AFIB Clinic        02/03/20 1349    apixaban (ELIQUIS) 5 MG TABS tablet  2 times daily        02/03/20 1607           Derwood Kaplan, MD 02/03/20 1608

## 2020-02-03 NOTE — Discharge Instructions (Signed)
You are seen in the ER for chest discomfort secondary to atrial fibrillation.  You were cardioverted back to sinus rhythm in the ER.  You will need to take Eliquis for the next month to reduce or prevent stroke.  Please call your cardiology service for follow-up. If you start having any neurologic symptoms such as one-sided numbness, weakness, slurred speech, vision change -call 911 immediately.

## 2020-02-04 ENCOUNTER — Other Ambulatory Visit (HOSPITAL_COMMUNITY): Payer: Self-pay | Admitting: *Deleted

## 2020-02-04 MED ORDER — APIXABAN 5 MG PO TABS: 5.0000 mg | ORAL_TABLET | Freq: Two times a day (BID) | ORAL | 3 refills | Status: DC

## 2020-02-27 ENCOUNTER — Telehealth (INDEPENDENT_AMBULATORY_CARE_PROVIDER_SITE_OTHER): Payer: 59 | Admitting: Internal Medicine

## 2020-02-27 ENCOUNTER — Encounter: Payer: Self-pay | Admitting: Internal Medicine

## 2020-02-27 VITALS — Ht 65.5 in | Wt 220.0 lb

## 2020-02-27 DIAGNOSIS — G4733 Obstructive sleep apnea (adult) (pediatric): Secondary | ICD-10-CM

## 2020-02-27 DIAGNOSIS — I4819 Other persistent atrial fibrillation: Secondary | ICD-10-CM | POA: Diagnosis not present

## 2020-02-27 NOTE — H&P (View-Only) (Signed)
Electrophysiology TeleHealth Note   Due to national recommendations of social distancing due to COVID 19, an audio/video telehealth visit is felt to be most appropriate for this patient at this time.  See MyChart message from today for the patient's consent to telehealth for [facility].  Date:  02/27/2020   ID:  Tracey Lin, DOB 02-11-1960, MRN [id number]  Location: patient's home  Provider location:  [place]  Evaluation Performed: Follow-up visit  PCP:  Patrice Paradise, MD   Electrophysiologist:  Dr Johney Frame  Chief Complaint:  palpitations  History of Present Illness:    Tracey Lin is a 60 y.o. female who presents via telehealth conferencing today.  Since last being seen in our clinic, the patient reports doing very well.  She was recently in the ED with recurrent afib.  Today, she denies symptoms of palpitations, chest pain, shortness of breath,  lower extremity edema, dizziness, presyncope, or syncope.  The patient is otherwise without complaint today.   Past Medical History:  Diagnosis Date  . Anxiety   . B12 deficiency   . Difficult intubation    "SMALL TRACHEA"  . GERD (gastroesophageal reflux disease)   . History of kidney stones    H/O  . OSA (obstructive sleep apnea)    USES CPAP ( rarely)  . Paroxysmal atrial fibrillation [facility])     Past Surgical History:  Procedure Laterality Date  . AUGMENTATION MAMMAPLASTY  2003  . BREAST BIOPSY Right 2005   benign  . COLONOSCOPY    . DILATATION & CURETTAGE/HYSTEROSCOPY WITH MYOSURE N/A 01/02/2018   Procedure: DILATATION & CURETTAGE/HYSTEROSCOPY WITH MYOSURE, polypectomy;  Surgeon: Christeen Douglas, MD;  Location: [facility] ORS;  Service: Gynecology;  Laterality: N/A;  . ESOPHAGOGASTRODUODENOSCOPY    . KNEE ARTHROSCOPY Left 12/02/2017   Procedure: ARTHROSCOPY KNEE WITH PARTIAL MEDIAL MENISECTOMY, REMOVAL OF LOOSE BODY;  Surgeon: Signa Kell, MD;  Location: [facility] ORS;  Service: Orthopedics;  Laterality:  Left;  . REDUCTION MAMMAPLASTY  2007  . TUMMY TUCK  2006    Current Outpatient Medications  Medication Sig Dispense Refill  . ALPRAZolam (XANAX) 0.5 MG tablet Take 0.5-1 tablets (0.25-0.5 mg total) by mouth at bedtime as needed for anxiety. 30 tablet 0  . apixaban (ELIQUIS) 5 MG TABS tablet Take 1 tablet (5 mg total) by mouth 2 (two) times daily. 60 tablet 3  . aspirin EC 81 MG tablet Take 81 mg by mouth daily.    Marland Kitchen atenolol (TENORMIN) 25 MG tablet TAKE 1 TABLET BY MOUTH TWICE A DAY. PLEASE KEEP UPCOMING APPT FOR FURTHER FILLS 180 tablet 3   No current facility-administered medications for this visit.    Allergies:   Codeine and Ivp dye [iodinated diagnostic agents]   Social History:  The patient  reports that she has never smoked. She has never used smokeless tobacco. She reports current alcohol use of about 1.0 standard drink of alcohol per week. She reports that she does not use drugs.   ROS:  Please see the history of present illness.   All other systems are personally reviewed and negative.    Exam:    Vital Signs:  Ht 5' 5.5" (1.664 m)   Wt 220 lb (99.8 kg)   LMP 08/09/2011 (Approximate) Comment: PT STILL SPOTS AS OF 08-08-17  BMI 36.05 kg/m   Well sounding and appearing, alert and conversant, regular work of breathing,  good skin color Eyes- anicteric, neuro- grossly intact, skin- no apparent rash or lesions or  cyanosis, mouth- oral mucosa is pink  Labs/Other Tests and Data Reviewed:    Recent Labs: 02/03/2020: BUN 16; Creatinine, Ser 0.82; Hemoglobin 13.4; Platelets 378; Potassium 4.7; Sodium 138   Wt Readings from Last 3 Encounters:  02/27/20 220 lb (99.8 kg)  01/07/20 226 lb 6.4 oz (102.7 kg)  10/08/19 224 lb (101.6 kg)       ASSESSMENT & PLAN:     1. Paroxysmal atrial fibrillation The patient has symptomatic, recurrent persistent atrial fibrillation. she has failed medical therapy with multaq. Chads2vasc score is 1.  she is anticoagulated with eliquis  . Therapeutic strategies for afib including medicine and ablation were discussed in detail with the patient today. Risk, benefits, and alternatives to EP study and radiofrequency ablation for afib were also discussed in detail today. These risks include but are not limited to stroke, bleeding, vascular damage, tamponade, perforation, damage to the esophagus, lungs, and other structures, pulmonary vein stenosis, worsening renal function, and death. The patient understands these risk and wishes to proceed.  We will therefore proceed with catheter ablation at the next available time.  Carto, ICE, anesthesia are requested for the procedure.  Will also obtain cardiac CT prior to the procedure to exclude LAA thrombus and further evaluate atrial anatomy.  2. OSA Compliance with CPAP advised  3. Obesity Body mass index is 36.05 kg/m. Lifestyle modification is advised  Risks, benefits and potential toxicities for medications prescribed and/or refilled reviewed with patient today.      Patient Risk:  after full review of this patients clinical status, I feel that they are at moderate risk at this time.  Today, I have spent 15 minutes with the patient with telehealth technology discussing arrhythmia management .    Randolm Idol, MD  02/27/2020 2:39 PM     [facility] [address] [place] [address] [phone] (office) [phone] (fax)

## 2020-02-27 NOTE — Progress Notes (Signed)
   Electrophysiology TeleHealth Note   Due to national recommendations of social distancing due to COVID 19, an audio/video telehealth visit is felt to be most appropriate for this patient at this time.  See MyChart message from today for the patient's consent to telehealth for [facility].  Date:  02/27/2020   ID:  Tracey Lin, DOB 06/03/1960, MRN [id number]  Location: patient's home  Provider location:  [place]   Evaluation Performed: Follow-up visit  PCP:  McLaughlin, Miriam K, MD   Electrophysiologist:  Dr Stefhanie Kachmar  Chief Complaint:  palpitations  History of Present Illness:    Tracey Lin is a 59 y.o. female who presents via telehealth conferencing today.  Since last being seen in our clinic, the patient reports doing very well.  She was recently in the ED with recurrent afib.  Today, she denies symptoms of palpitations, chest pain, shortness of breath,  lower extremity edema, dizziness, presyncope, or syncope.  The patient is otherwise without complaint today.   Past Medical History:  Diagnosis Date  . Anxiety   . B12 deficiency   . Difficult intubation    "SMALL TRACHEA"  . GERD (gastroesophageal reflux disease)   . History of kidney stones    H/O  . OSA (obstructive sleep apnea)    USES CPAP ( rarely)  . Paroxysmal atrial fibrillation (HCC)     Past Surgical History:  Procedure Laterality Date  . AUGMENTATION MAMMAPLASTY  2003  . BREAST BIOPSY Right 2005   benign  . COLONOSCOPY    . DILATATION & CURETTAGE/HYSTEROSCOPY WITH MYOSURE N/A 01/02/2018   Procedure: DILATATION & CURETTAGE/HYSTEROSCOPY WITH MYOSURE, polypectomy;  Surgeon: Beasley, Bethany, MD;  Location: [facility];  Service: Gynecology;  Laterality: N/A;  . ESOPHAGOGASTRODUODENOSCOPY    . KNEE ARTHROSCOPY Left 12/02/2017   Procedure: ARTHROSCOPY KNEE WITH PARTIAL MEDIAL MENISECTOMY, REMOVAL OF LOOSE BODY;  Surgeon: Patel, Sunny, MD;  Location: [facility];  Service: Orthopedics;  Laterality:  Left;  . REDUCTION MAMMAPLASTY  2007  . TUMMY TUCK  2006    Current Outpatient Medications  Medication Sig Dispense Refill  . ALPRAZolam (XANAX) 0.5 MG tablet Take 0.5-1 tablets (0.25-0.5 mg total) by mouth at bedtime as needed for anxiety. 30 tablet 0  . apixaban (ELIQUIS) 5 MG TABS tablet Take 1 tablet (5 mg total) by mouth 2 (two) times daily. 60 tablet 3  . aspirin EC 81 MG tablet Take 81 mg by mouth daily.    . atenolol (TENORMIN) 25 MG tablet TAKE 1 TABLET BY MOUTH TWICE A DAY. PLEASE KEEP UPCOMING APPT FOR FURTHER FILLS 180 tablet 3   No current facility-administered medications for this visit.    Allergies:   Codeine and Ivp dye [iodinated diagnostic agents]   Social History:  The patient  reports that she has never smoked. She has never used smokeless tobacco. She reports current alcohol use of about 1.0 standard drink of alcohol per week. She reports that she does not use drugs.   ROS:  Please see the history of present illness.   All other systems are personally reviewed and negative.    Exam:    Vital Signs:  Ht 5' 5.5" (1.664 m)   Wt 220 lb (99.8 kg)   LMP 08/09/2011 (Approximate) Comment: PT STILL SPOTS AS OF 08-08-17  BMI 36.05 kg/m   Well sounding and appearing, alert and conversant, regular work of breathing,  good skin color Eyes- anicteric, neuro- grossly intact, skin- no apparent rash or lesions or   cyanosis, mouth- oral mucosa is pink  Labs/Other Tests and Data Reviewed:    Recent Labs: 02/03/2020: BUN 16; Creatinine, Ser 0.82; Hemoglobin 13.4; Platelets 378; Potassium 4.7; Sodium 138   Wt Readings from Last 3 Encounters:  02/27/20 220 lb (99.8 kg)  01/07/20 226 lb 6.4 oz (102.7 kg)  10/08/19 224 lb (101.6 kg)       ASSESSMENT & PLAN:     1. Paroxysmal atrial fibrillation The patient has symptomatic, recurrent persistent atrial fibrillation. she has failed medical therapy with multaq. Chads2vasc score is 1.  she is anticoagulated with eliquis  . Therapeutic strategies for afib including medicine and ablation were discussed in detail with the patient today. Risk, benefits, and alternatives to EP study and radiofrequency ablation for afib were also discussed in detail today. These risks include but are not limited to stroke, bleeding, vascular damage, tamponade, perforation, damage to the esophagus, lungs, and other structures, pulmonary vein stenosis, worsening renal function, and death. The patient understands these risk and wishes to proceed.  We will therefore proceed with catheter ablation at the next available time.  Carto, ICE, anesthesia are requested for the procedure.  Will also obtain cardiac CT prior to the procedure to exclude LAA thrombus and further evaluate atrial anatomy.  2. OSA Compliance with CPAP advised  3. Obesity Body mass index is 36.05 kg/m. Lifestyle modification is advised  Risks, benefits and potential toxicities for medications prescribed and/or refilled reviewed with patient today.      Patient Risk:  after full review of this patients clinical status, I feel that they are at moderate risk at this time.  Today, I have spent 15 minutes with the patient with telehealth technology discussing arrhythmia management .    Randolm Idol, MD  02/27/2020 2:39 PM     [facility] [address] [place] [address] [phone] (office) [phone] (fax)

## 2020-02-28 ENCOUNTER — Encounter: Payer: Self-pay | Admitting: *Deleted

## 2020-02-28 ENCOUNTER — Telehealth: Payer: Self-pay | Admitting: *Deleted

## 2020-02-28 DIAGNOSIS — I4891 Unspecified atrial fibrillation: Secondary | ICD-10-CM

## 2020-02-28 MED ORDER — PREDNISONE 50 MG PO TABS: ORAL_TABLET | ORAL | 0 refills | Status: DC

## 2020-02-28 NOTE — Telephone Encounter (Signed)
Ablation scheduled March 1. CT and labs ordered. Pre medications ordered for CT allergies. Labs and covid screening scheduled. Patient to view instructions over mychart and to call with any questions.  Patient agreeable and verbalized understanding.

## 2020-02-28 NOTE — Telephone Encounter (Signed)
-----   Message from Hillis Range, MD sent at 02/27/2020 12:59 PM EST ----- Afib ablation [facility]iac CT

## 2020-02-29 ENCOUNTER — Telehealth: Payer: Self-pay | Admitting: Internal Medicine

## 2020-02-29 NOTE — Telephone Encounter (Signed)
         I went in pt's chart to see who prescribed Prednisone for her

## 2020-03-12 ENCOUNTER — Other Ambulatory Visit: Payer: 59

## 2020-03-12 ENCOUNTER — Other Ambulatory Visit: Payer: Self-pay

## 2020-03-12 DIAGNOSIS — I4891 Unspecified atrial fibrillation: Secondary | ICD-10-CM

## 2020-03-12 LAB — CBC WITH DIFFERENTIAL/PLATELET
Basophils Absolute: 0 10*3/uL (ref 0.0–0.2)
Basos: 0 %
EOS (ABSOLUTE): 0.1 10*3/uL (ref 0.0–0.4)
Eos: 1 %
Hematocrit: 39.8 % (ref 34.0–46.6)
Hemoglobin: 13 g/dL (ref 11.1–15.9)
Immature Grans (Abs): 0 10*3/uL (ref 0.0–0.1)
Immature Granulocytes: 1 %
Lymphocytes Absolute: 2.4 10*3/uL (ref 0.7–3.1)
Lymphs: 41 %
MCH: 28.4 pg (ref 26.6–33.0)
MCHC: 32.7 g/dL (ref 31.5–35.7)
MCV: 87 fL (ref 79–97)
Monocytes Absolute: 0.4 10*3/uL (ref 0.1–0.9)
Monocytes: 7 %
Neutrophils Absolute: 3 10*3/uL (ref 1.4–7.0)
Neutrophils: 50 %
Platelets: 353 10*3/uL (ref 150–450)
RBC: 4.58 x10E6/uL (ref 3.77–5.28)
RDW: 12.4 % (ref 11.7–15.4)
WBC: 5.8 10*3/uL (ref 3.4–10.8)

## 2020-03-12 LAB — BASIC METABOLIC PANEL
BUN/Creatinine Ratio: 16 (ref 9–23)
BUN: 12 mg/dL (ref 6–24)
CO2: 23 mmol/L (ref 20–29)
Calcium: 8.7 mg/dL (ref 8.7–10.2)
Chloride: 103 mmol/L (ref 96–106)
Creatinine, Ser: 0.73 mg/dL (ref 0.57–1.00)
GFR calc Af Amer: 105 mL/min/{1.73_m2} (ref >59)
GFR calc non Af Amer: 91 mL/min/{1.73_m2} (ref >59)
Glucose: 94 mg/dL (ref 65–99)
Potassium: 4.3 mmol/L (ref 3.5–5.2)
Sodium: 140 mmol/L (ref 134–144)

## 2020-03-17 ENCOUNTER — Telehealth (HOSPITAL_COMMUNITY): Payer: Self-pay | Admitting: *Deleted

## 2020-03-17 NOTE — Telephone Encounter (Signed)
Reaching out to patient to offer assistance regarding upcoming cardiac imaging study; pt verbalizes understanding of appt date/time, parking situation and where to check in, pre-test NPO status and medications ordered, and verified current allergies; name and call back number provided for further questions should they arise  Tracey Brick RN Navigator Cardiac Imaging [facility] [phone] office [phone] cell  Pt verbalized how to take 13hr prep.

## 2020-03-18 ENCOUNTER — Ambulatory Visit (HOSPITAL_COMMUNITY)
Admission: RE | Admit: 2020-03-18 | Discharge: 2020-03-18 | Disposition: A | Discharge status: Home / Self Care | Payer: 59 | Source: Ambulatory Visit | Attending: Internal Medicine | Admitting: Internal Medicine

## 2020-03-18 ENCOUNTER — Other Ambulatory Visit: Payer: Self-pay

## 2020-03-18 DIAGNOSIS — I4891 Unspecified atrial fibrillation: Secondary | ICD-10-CM | POA: Diagnosis not present

## 2020-03-18 MED ORDER — IOHEXOL 350 MG/ML SOLN
80.0000 mL | Freq: Once | INTRAVENOUS | Status: AC | PRN
  Administered 2020-03-18: 80 mL via INTRAVENOUS

## 2020-03-19 ENCOUNTER — Telehealth: Payer: Self-pay | Admitting: Internal Medicine

## 2020-03-19 NOTE — Telephone Encounter (Signed)
Left message and reiterated that her instructions for CT and ablation are in mychart since 02/28/20. Will call back to make sure she finds them okay.

## 2020-03-19 NOTE — Telephone Encounter (Signed)
    Pt said her procedure on 03/01 and she still don't know any info about it, what time she needs to be in the hospital and what she needs to do prior procedure

## 2020-03-21 NOTE — Telephone Encounter (Signed)
Left message to call back  

## 2020-03-22 ENCOUNTER — Other Ambulatory Visit (HOSPITAL_COMMUNITY)
Admission: RE | Admit: 2020-03-22 | Discharge: 2020-03-22 | Disposition: A | Discharge status: Home / Self Care | Payer: 59 | Source: Ambulatory Visit | Attending: Internal Medicine | Admitting: Internal Medicine

## 2020-03-22 DIAGNOSIS — U071 COVID-19: Secondary | ICD-10-CM | POA: Insufficient documentation

## 2020-03-22 DIAGNOSIS — Z01812 Encounter for preprocedural laboratory examination: Secondary | ICD-10-CM | POA: Diagnosis present

## 2020-03-22 LAB — SARS CORONAVIRUS 2 (TAT 6-24 HRS): SARS Coronavirus 2: POSITIVE — AB

## 2020-03-22 NOTE — Progress Notes (Signed)
Prior to being covid tested the pt stated that she tested + for covid on 02/29/20, however she did not have any proof. Pt advised that she will need to be tested and it will be the decision of the ordering provider if her procedure will take place based on the results.

## 2020-03-23 NOTE — Progress Notes (Signed)
Tracey Lin from the on-call service for Dr. Tommie Sams with + covid results for this pt. The pt is to have surgery on Tues 03/25/20 at [facility] at 1030. The pt has not been notified as there will be pertinent questions the provider needs to answer.   Positive Results for:  Asymptomatic: Procedure postponed and quarantined for 10 days, unless the procedure is urgent. Please follow the facility's protocol regarding pt and family arrival.  Symptomatic: Procedure postponed and quarantined for 14 days.  Hospitalized with Covid: postponed and quarantined  for 21 days.  Immunocompromised: Procedure postponed and quarantine for 20 days.  The pt will not be retested for 90 days from the + result.

## 2020-03-24 ENCOUNTER — Telehealth: Payer: Self-pay | Admitting: Internal Medicine

## 2020-03-24 NOTE — Telephone Encounter (Signed)
See my chart message

## 2020-03-24 NOTE — Progress Notes (Signed)
Instructed patient on the following items: Arrival time 0830 Nothing to eat or drink after midnight No meds AM of procedure Responsible person to drive you home and stay with you for 24 hrs  Have you missed any doses of anti-coagulant Eliquis- hasn't missed any doses   

## 2020-03-24 NOTE — Telephone Encounter (Signed)
    COVID-19 Pre-Screening Questions V2.:  . In the past 7 to 10 days have you had a cough,  shortness of breath, headache, congestion, fever (100 or greater) body aches, chills, sore throat, or sudden loss of taste or sense of smell,? . Have you been around anyone with known Covid 19, or who is waiting for a Covid test result and is symptomatic?  Patient is following up regarding recent patient message.  She states she tested positive for COVID during her pre-screening on 03/22/20. Per patient, she is asymptomatic and she has not been exposed to anyone with COVID. She would like to know if she will have to     For patients who are Covid+, pending results or exposed in the last 5 days WITH SYMPTOMS:  1.       Offer to change appointment to a [place] appointment. If the patient declines, contact covering nurse or triage so it can be determined if patient needs to be seen or     rescheduled. (assumes patient calls ahead)  2.       If a patient presents in the lobby, ask them to return to their car and the nurse will call them. Obtain the correct cell phone number and message the covering nurse. The nurse will triage the patient and discuss with the provider to determine appropriate visit plan (In office, MyChart or reschedule).   3.       If it is determined the patient needs to be seen in the office, arrangements will be made for the patient to be seen in a remote room with minimal touches. (Location will be         specific to each [facility] site).               If you have any concerns/questions about symptoms patients report during screening (either on the phone or at threshold),                Send a secure chat with the patient's chart to the APP doing preop clearances for that day.              If the decision is made to see the patient, document the following in the appt. note:  "cleared by APP's initials".                        If an APP is not available contact a member of the  leadership team.   Patients who are Covid+ are allowed in the office when the following are met: 1. No symptoms or improving symptoms  2. At least 5 days post positive test

## 2020-03-25 ENCOUNTER — Other Ambulatory Visit: Payer: Self-pay

## 2020-03-25 ENCOUNTER — Ambulatory Visit (HOSPITAL_COMMUNITY)
Admission: RE | Admit: 2020-03-25 | Discharge: 2020-03-25 | Disposition: A | Discharge status: Home / Self Care | Payer: 59 | Attending: Internal Medicine | Admitting: Internal Medicine

## 2020-03-25 ENCOUNTER — Encounter (HOSPITAL_COMMUNITY)
Admission: RE | Disposition: A | Discharge status: Home / Self Care | Payer: Self-pay | Source: Home / Self Care | Attending: Internal Medicine

## 2020-03-25 ENCOUNTER — Encounter (HOSPITAL_COMMUNITY): Payer: Self-pay | Admitting: Internal Medicine

## 2020-03-25 ENCOUNTER — Ambulatory Visit (HOSPITAL_COMMUNITY): Payer: 59 | Admitting: Certified Registered Nurse Anesthetist

## 2020-03-25 DIAGNOSIS — Z79899 Other long term (current) drug therapy: Secondary | ICD-10-CM | POA: Diagnosis not present

## 2020-03-25 DIAGNOSIS — Z7982 Long term (current) use of aspirin: Secondary | ICD-10-CM | POA: Insufficient documentation

## 2020-03-25 DIAGNOSIS — I480 Paroxysmal atrial fibrillation: Secondary | ICD-10-CM | POA: Insufficient documentation

## 2020-03-25 DIAGNOSIS — Z6836 Body mass index (BMI) 36.0-36.9, adult: Secondary | ICD-10-CM | POA: Diagnosis not present

## 2020-03-25 DIAGNOSIS — G4733 Obstructive sleep apnea (adult) (pediatric): Secondary | ICD-10-CM | POA: Diagnosis not present

## 2020-03-25 DIAGNOSIS — Z7901 Long term (current) use of anticoagulants: Secondary | ICD-10-CM | POA: Insufficient documentation

## 2020-03-25 DIAGNOSIS — E669 Obesity, unspecified: Secondary | ICD-10-CM | POA: Insufficient documentation

## 2020-03-25 HISTORY — PX: ATRIAL FIBRILLATION ABLATION: EP1191

## 2020-03-25 LAB — POCT ACTIVATED CLOTTING TIME: Activated Clotting Time: 368 seconds

## 2020-03-25 SURGERY — ATRIAL FIBRILLATION ABLATION
Anesthesia: General

## 2020-03-25 MED ORDER — HEPARIN SODIUM (PORCINE) 1000 UNIT/ML IJ SOLN
INTRAMUSCULAR | Status: DC | PRN
  Administered 2020-03-25: 1000 [IU] via INTRAVENOUS

## 2020-03-25 MED ORDER — ISOPROTERENOL HCL 0.2 MG/ML IJ SOLN
INTRAMUSCULAR | Status: AC
  Filled 2020-03-25: qty 5

## 2020-03-25 MED ORDER — PROTAMINE SULFATE 10 MG/ML IV SOLN
INTRAVENOUS | Status: DC | PRN
  Administered 2020-03-25: 30 mg via INTRAVENOUS

## 2020-03-25 MED ORDER — ONDANSETRON HCL 4 MG/2ML IJ SOLN
INTRAMUSCULAR | Status: DC | PRN
  Administered 2020-03-25 (×2): 4 mg via INTRAVENOUS

## 2020-03-25 MED ORDER — PROPOFOL 10 MG/ML IV BOLUS
INTRAVENOUS | Status: DC | PRN
Start: 2020-03-25 — End: 2020-03-25
  Administered 2020-03-25: 140 mg via INTRAVENOUS

## 2020-03-25 MED ORDER — APIXABAN 5 MG PO TABS
5.0000 mg | ORAL_TABLET | ORAL | Status: AC
  Administered 2020-03-25: 5 mg via ORAL
  Filled 2020-03-25 (×2): qty 1

## 2020-03-25 MED ORDER — SODIUM CHLORIDE 0.9 % IV SOLN: INTRAVENOUS | Status: DC

## 2020-03-25 MED ORDER — DEXAMETHASONE SODIUM PHOSPHATE 10 MG/ML IJ SOLN
INTRAMUSCULAR | Status: DC | PRN
  Administered 2020-03-25: 4 mg via INTRAVENOUS

## 2020-03-25 MED ORDER — PHENYLEPHRINE HCL-NACL 10-0.9 MG/250ML-% IV SOLN
INTRAVENOUS | Status: DC | PRN
  Administered 2020-03-25: 50 ug/min via INTRAVENOUS

## 2020-03-25 MED ORDER — MIDAZOLAM HCL 5 MG/5ML IJ SOLN
INTRAMUSCULAR | Status: DC | PRN
  Administered 2020-03-25: 2 mg via INTRAVENOUS

## 2020-03-25 MED ORDER — ISOPROTERENOL HCL 0.2 MG/ML IJ SOLN
INTRAVENOUS | Status: DC | PRN
  Administered 2020-03-25: 20 ug/min via INTRAVENOUS

## 2020-03-25 MED ORDER — SUGAMMADEX SODIUM 200 MG/2ML IV SOLN
INTRAVENOUS | Status: DC | PRN
  Administered 2020-03-25: 200 mg via INTRAVENOUS

## 2020-03-25 MED ORDER — HEPARIN SODIUM (PORCINE) 1000 UNIT/ML IJ SOLN
INTRAMUSCULAR | Status: AC
  Filled 2020-03-25: qty 2

## 2020-03-25 MED ORDER — ROCURONIUM BROMIDE 10 MG/ML (PF) SYRINGE
PREFILLED_SYRINGE | INTRAVENOUS | Status: DC | PRN
  Administered 2020-03-25: 50 mg via INTRAVENOUS
  Administered 2020-03-25: 25 mg via INTRAVENOUS

## 2020-03-25 MED ORDER — SODIUM CHLORIDE 0.9% FLUSH: 3.0000 mL | Freq: Two times a day (BID) | INTRAVENOUS | Status: DC

## 2020-03-25 MED ORDER — HEPARIN (PORCINE) IN NACL 1000-0.9 UT/500ML-% IV SOLN
INTRAVENOUS | Status: DC | PRN
  Administered 2020-03-25: 500 mL

## 2020-03-25 MED ORDER — LIDOCAINE 2% (20 MG/ML) 5 ML SYRINGE
INTRAMUSCULAR | Status: DC | PRN
  Administered 2020-03-25: 100 mg via INTRAVENOUS

## 2020-03-25 MED ORDER — SUCCINYLCHOLINE CHLORIDE 20 MG/ML IJ SOLN
INTRAMUSCULAR | Status: DC | PRN
  Administered 2020-03-25: 120 mg via INTRAVENOUS

## 2020-03-25 MED ORDER — SODIUM CHLORIDE 0.9 % IV SOLN: 250.0000 mL | INTRAVENOUS | Status: DC | PRN

## 2020-03-25 MED ORDER — PANTOPRAZOLE SODIUM 40 MG PO TBEC: 40.0000 mg | DELAYED_RELEASE_TABLET | Freq: Every day | ORAL | 0 refills | Status: DC

## 2020-03-25 MED ORDER — PHENYLEPHRINE HCL (PRESSORS) 10 MG/ML IV SOLN
INTRAVENOUS | Status: DC | PRN
  Administered 2020-03-25: 160 ug via INTRAVENOUS

## 2020-03-25 MED ORDER — HEPARIN SODIUM (PORCINE) 1000 UNIT/ML IJ SOLN
INTRAMUSCULAR | Status: DC | PRN
  Administered 2020-03-25: 1000 [IU] via INTRAVENOUS
  Administered 2020-03-25: 15000 [IU] via INTRAVENOUS

## 2020-03-25 MED ORDER — FENTANYL CITRATE (PF) 100 MCG/2ML IJ SOLN
INTRAMUSCULAR | Status: DC | PRN
  Administered 2020-03-25 (×2): 50 ug via INTRAVENOUS

## 2020-03-25 SURGICAL SUPPLY — 19 items
BLANKET WARM UNDERBOD FULL ACC (MISCELLANEOUS) ×2 IMPLANT
CATH MAPPNG PENTARAY F 2-6-2MM (CATHETERS) ×2 IMPLANT
CATH SMTCH THERMOCOOL SF DF (CATHETERS) ×2 IMPLANT
CATH SOUNDSTAR ECO 8FR (CATHETERS) ×2 IMPLANT
CATH WEBSTER BI DIR CS D-F CRV (CATHETERS) ×2 IMPLANT
CLOSURE PERCLOSE PROSTYLE (VASCULAR PRODUCTS) ×6 IMPLANT
COVER SWIFTLINK CONNECTOR (BAG) ×2 IMPLANT
MAT PREVALON FULL STRYKER (MISCELLANEOUS) ×2 IMPLANT
NEEDLE BAYLIS TRANSSEPTAL 71CM (NEEDLE) ×2 IMPLANT
PACK EP LATEX FREE (CUSTOM PROCEDURE TRAY) ×1
PACK EP LF (CUSTOM PROCEDURE TRAY) ×1 IMPLANT
PAD PRO RADIOLUCENT 2001M-C (PAD) ×2 IMPLANT
PATCH CARTO3 (PAD) ×2 IMPLANT
PENTARAY F 2-6-2MM (CATHETERS) ×4
SHEATH PINNACLE 7F 10CM (SHEATH) ×4 IMPLANT
SHEATH PINNACLE 9F 10CM (SHEATH) ×2 IMPLANT
SHEATH PROBE COVER 6X72 (BAG) ×2 IMPLANT
SHEATH SWARTZ TS SL2 63CM 8.5F (SHEATH) ×2 IMPLANT
TUBING SMART ABLATE COOLFLOW (TUBING) ×2 IMPLANT

## 2020-03-25 NOTE — Discharge Instructions (Signed)
Post procedure care instructions No driving for 4 days. No lifting over 5 lbs for 1 week. No vigorous or sexual activity for 1 week. You may return to work/your usual activities on 04/02/20. Keep procedure site clean & dry. If you notice increased pain, swelling, bleeding or pus, call/return!  You may shower after 24 hours, but no soaking in baths/hot tubs/pools for 1 week.   You have an appointment set up with the Atrial Fibrillation Clinic.  Multiple studies have shown that being followed by a dedicated atrial fibrillation clinic in addition to the standard care you receive from your other physicians improves health. We believe that enrollment in the atrial fibrillation clinic will allow [place] to better care for you.   The phone number to the Atrial Fibrillation Clinic is [phone]. The clinic is staffed Monday through Friday from 8:30am to 5pm.  Parking Directions: The clinic is located in the Heart and Vascular Building connected to [facility]. 1)From [address] turn on to [address] and go to the 3rd entrance  (Heart and Vascular entrance) on the right. 2)Look to the right for Heart &Vascular Parking Garage. 3)A code for the entrance is required, for March is 1223   4)Take the elevators to the 1st floor. Registration is in the room with the glass walls at the end of the hallway.  If you have any trouble parking or locating the clinic, please don't hesitate to call [phone].

## 2020-03-25 NOTE — Transfer of Care (Signed)
Immediate Anesthesia Transfer of Care Note  Patient: Tracey Lin  Procedure(s) Performed: ATRIAL FIBRILLATION ABLATION (N/A )  Patient Location: PACU and Cath Lab  Anesthesia Type:General  Level of Consciousness: awake, alert  and oriented  Airway & Oxygen Therapy: Patient Spontanous Breathing and Patient connected to nasal cannula oxygen  Post-op Assessment: Report given to RN and Post -op Vital signs reviewed and stable  Post vital signs: Reviewed and stable  Last Vitals:  Vitals Value Taken Time  BP 98/46 03/25/20 1319  Temp 36.4 C 03/25/20 1319  Pulse 75 03/25/20 1321  Resp 12 03/25/20 1321  SpO2 100 % 03/25/20 1321  Vitals shown include unvalidated device data.  Last Pain:  Vitals:   03/25/20 1319  TempSrc: Temporal  PainSc: 0-No pain      Patients Stated Pain Goal: 5 (03/25/20 0848)  Complications: No complications documented.

## 2020-03-25 NOTE — Interval H&P Note (Signed)
History and Physical Interval Note:  03/25/2020 10:02 AM  [name]  has presented today for surgery, with the diagnosis of afib.  The various methods of treatment have been discussed with the patient and family. After consideration of risks, benefits and other options for treatment, the patient has consented to  Procedure(s): ATRIAL FIBRILLATION ABLATION (N/A) as a surgical intervention.  The patient's history has been reviewed, patient examined, no change in status, stable for surgery.  I have reviewed the patient's chart and labs.  Questions were answered to the patient's satisfaction.    Cardiac CT reviewed with her at length.  She reports compliance with eliquis without interruption.  Covid test discussed with [name].  Test from [facility] from early February confirmed to be positive also.  No covid symptoms currently.  Risk, benefits, and alternatives to EP study and radiofrequency ablation for afib were also discussed in detail today. These risks include but are not limited to stroke, bleeding, vascular damage, tamponade, perforation, damage to the esophagus, lungs, and other structures, pulmonary vein stenosis, worsening renal function, and death. The patient understands these risk and wishes to proceed.    Tracey Lin

## 2020-03-25 NOTE — Anesthesia Procedure Notes (Signed)
Procedure Name: Intubation Date/Time: 03/25/2020 10:53 AM Performed by: Inda Coke, CRNA Pre-anesthesia Checklist: Patient identified, Emergency Drugs available, Suction available and Patient being monitored Patient Re-evaluated:Patient Re-evaluated prior to induction Oxygen Delivery Method: Circle System Utilized Preoxygenation: Pre-oxygenation with 100% oxygen Induction Type: IV induction Ventilation: Mask ventilation without difficulty Laryngoscope Size: Mac and 3 Grade View: Grade I Tube type: Oral Tube size: 7.0 mm Number of attempts: 1 Airway Equipment and Method: Stylet and Oral airway Placement Confirmation: ETT inserted through vocal cords under direct vision,  positive ETCO2 and breath sounds checked- equal and bilateral Secured at: 22 cm Tube secured with: Tape Dental Injury: Teeth and Oropharynx as per pre-operative assessment

## 2020-03-25 NOTE — Anesthesia Preprocedure Evaluation (Addendum)
Anesthesia Evaluation  Patient identified by MRN, date of birth, ID band Patient awake  General Assessment Comment:Covid positive. Asymptomatic with SOB or CP. Dr. Chilton Si  Reviewed: Allergy & Precautions, NPO status , Patient's Chart, lab work & pertinent test results  History of Anesthesia Complications (+) DIFFICULT AIRWAY  Airway Mallampati: II  TM Distance: >3 FB     Dental   Pulmonary    breath sounds clear to auscultation       Cardiovascular + dysrhythmias  Rhythm:Regular Rate:Normal     Neuro/Psych    GI/Hepatic Neg liver ROS, GERD  ,  Endo/Other  negative endocrine ROS  Renal/GU negative Renal ROS     Musculoskeletal   Abdominal   Peds  Hematology  (+) anemia ,   Anesthesia Other Findings   Reproductive/Obstetrics                             Anesthesia Physical Anesthesia Plan  ASA: III  Anesthesia Plan: General   Post-op Pain Management:    Induction: Intravenous  PONV Risk Score and Plan: 3 and Ondansetron and Dexamethasone  Airway Management Planned: Oral ETT and Video Laryngoscope Planned  Additional Equipment:   Intra-op Plan:   Post-operative Plan: Possible Post-op intubation/ventilation  Informed Consent: I have reviewed the patients History and Physical, chart, labs and discussed the procedure including the risks, benefits and alternatives for the proposed anesthesia with the patient or authorized representative who has indicated his/her understanding and acceptance.     Dental advisory given  Plan Discussed with: CRNA and Anesthesiologist  Anesthesia Plan Comments:        Anesthesia Quick Evaluation

## 2020-03-26 ENCOUNTER — Encounter (HOSPITAL_COMMUNITY): Payer: Self-pay | Admitting: Internal Medicine

## 2020-03-26 NOTE — Anesthesia Postprocedure Evaluation (Signed)
Anesthesia Post Note  Patient: Tracey Lin  Procedure(s) Performed: ATRIAL FIBRILLATION ABLATION (N/A )     Patient location during evaluation: PACU Anesthesia Type: General Level of consciousness: awake Pain management: pain level controlled Vital Signs Assessment: post-procedure vital signs reviewed and stable Respiratory status: spontaneous breathing Cardiovascular status: stable Postop Assessment: no apparent nausea or vomiting Anesthetic complications: no   No complications documented.  Last Vitals:  Vitals:   03/25/20 1615 03/25/20 1645  BP: 122/64 122/63  Pulse: 83 77  Resp: 20 15  Temp:    SpO2: 97% 98%    Last Pain:  Vitals:   03/25/20 1454  TempSrc:   PainSc: 0-No pain                 Moroni Nester

## 2020-04-22 ENCOUNTER — Ambulatory Visit (HOSPITAL_COMMUNITY)
Admission: RE | Admit: 2020-04-22 | Discharge: 2020-04-22 | Disposition: A | Discharge status: Home / Self Care | Payer: 59 | Source: Ambulatory Visit | Attending: Nurse Practitioner | Admitting: Nurse Practitioner

## 2020-04-22 ENCOUNTER — Encounter (HOSPITAL_COMMUNITY): Payer: Self-pay | Admitting: Nurse Practitioner

## 2020-04-22 ENCOUNTER — Other Ambulatory Visit: Payer: Self-pay

## 2020-04-22 VITALS — BP 132/82 | HR 66 | Ht 65.5 in | Wt 227.4 lb

## 2020-04-22 DIAGNOSIS — I480 Paroxysmal atrial fibrillation: Secondary | ICD-10-CM | POA: Diagnosis not present

## 2020-04-22 DIAGNOSIS — I4891 Unspecified atrial fibrillation: Secondary | ICD-10-CM

## 2020-04-22 DIAGNOSIS — Z7901 Long term (current) use of anticoagulants: Secondary | ICD-10-CM | POA: Insufficient documentation

## 2020-04-22 DIAGNOSIS — D6869 Other thrombophilia: Secondary | ICD-10-CM | POA: Diagnosis not present

## 2020-04-22 MED ORDER — ATENOLOL 25 MG PO TABS: 25.0000 mg | ORAL_TABLET | Freq: Two times a day (BID) | ORAL | 1 refills | Status: DC

## 2020-04-22 NOTE — Addendum Note (Signed)
Encounter addended by: Shona Simpson, RN on: 04/22/2020 1:13 PM  Actions taken: Order list changed

## 2020-04-22 NOTE — Progress Notes (Signed)
Primary Care Physician: Patrice Paradise, MD Referring Physician:  F/u hospitalization  [facility] hospital     Tracey Lin is a 60 y.o. female with a h/o paroxysmal afib, sleep apnea, treated with CPAP that is in the afib clinic for f/u hospitalization for afib while in [place], [place] to attend a [place] rally in 2019. She was walking to her car and  was aware of increased HR, dizziness and lightheadedness and was taken to [facility].  She had been out in the sun for about 30 mins and her fluid intake had been less than usual  for the day. Her HR was 170 bpm. She was thought to have mild dehydration.She was admitted, placed on Cardizem drip for a couple of days then started on Multaq, ranexa, atenolol, eliquis and did convert to SR prior to discharge. The MD told pt that she would need quick f/u as multaq was for short term as it could cause liver failure. She is planning to see Dr. Johney Frame soon. She would like to discuss coming off drugs and a possible ablation down the line. CHA2DS2VASc score is 1 for female. She would like to get clearance to have her knee surgery as she wants this first so she can get back to exercising to obtain wight loss. Echo was done at [facility] and with normal results( all records can be found in Care Everywhere).   She has had afib for many years but it had been quiet, treated in the [place] area until she moved to [place] 6 years ago. She saw Dr. Darrold Junker a couple of times but has not seen him since 2015.  She was suppose to have knee surgery last week but it was cancelled.  She states that she has gained  40 lbs over the last 6 years. She does use cpap, but still sleeps very poorly. Feels that she does get at least 4 hours a night use.  F/u in afib clinic 9/9. When she saw Dr. Johney Frame, he advised her to start weaning off the ranexa, multaq and DOAC which she has successfully done. She  feels well. No further afib. She did have a SBO which self released  right after I saw her last in clinic. No further reoccurrence.   F/u in afib clinic 02/22/18, as she had a ER visit for afib and went to the ER for a cardioversion. She had multaq and eliquis on hand and took these but did not help to restore SR.  The cardioversion was successful and she is in SR today. She still would prefer to stay off daily meds . She has talked to Dr. Johney Frame about an ablation in the past but is still not quite ready for this either unless afib burden increases. She is seeing a weight loss specialist. She has not used her cpap for sleep apnea in some time, cannot tolerate mask. She thinks the trigger was lack of sleep and working long hours.   F/u in AF clinic 07/11/18. Patient reports that last night she began having feelings of SOB, weakness, and heart racing. In office today she is in afib with RVR and is very SOB, especially when walking. She has not passed out but has also been very lightheaded. She is not on Eliquis or Multaq at this time.  She was sent to the ER for urgent cardioversion.  F/u afib clinic, 07/21/18. She is s/p successful  CV and is staying in rhythm. She remains on Multaq and Eliquis  but does not want to stay on daily long term. She cannot tolerate the cpap and states that she sleeps poorly. She has stopped going to the weight loss MD as it was inconvenient for her in [place]. She has gained weight with Covid and working from home.   F/u in afib clinic, 03/30/19. She  had return of rapid afib last night with difficulty breathing and lightheadedness and asked to be seen today. She  in in afib at 138 bpm. BP soft around 100 systolic.  She is not on anticoagulation  for a CHA2DS2VASc score of 1. Last time I saw her in June she required an urgent cardioversion. Was on Multaq for a while but stopped in August 2020. She takes anticoagulation for the 4 weeks after cardioversion but then stops drug per protocol.  She did have an episode last week for a few hours of elevated HR but  self converted. Discussed with Dr. Johney Frame and he felt she should proceed to the ER for an urgent cardioversion as she is not on anticoagulation and is in the 48 hours of onset afib. Has not used cpap for OSA in past.  F/u in afib clinic, 04/13/19.  after successful cardioversion in the ER. She continues on eliquis 5 mg bid for post cardioversion protocol. Discussion today if she prefers to go back on Multaq bid to help prevent afib as she has had 2 cardioversions 6 months apart, or if she wants to discuss ablation with Dr. Johney Frame. She prefers to discuss with Dr. Johney Frame as she does not like to take meds. Will need updated echo.   F/u in afib clinic, 01/07/20. She had an appointment with Dr. Johney Frame 3 months ago but no change in approach to afib management was made. She is in SR today. No  further afib episodes to report. She is in SR today. She continues on atenolol,  CHA2DS2VASc score of 1, on daily asa and off eliquis after 4 weeks following last  cardioversion. She previously took Advertising copywriter, it worked well but she did not want to continue daily meds. She continues use of CPAP. She would prefer not to pursue ablation at this time.   F/u in afib clinic, 04/23/19. She is now one month s/p afib ablation and is very happy with the results. She has not noted any afib. No swallowing or groin issues. She is back to her usual activities. Remains on eliquis 5 mg bid for a CHA2DS2VASc score of 1.    Today, she denies symptoms of palpitations, chest pain,+ shortness of breath/lightedness, no orthopnea, PND, lower extremity edema, dizziness, presyncope, syncope, or neurologic sequela. The patient is tolerating medications without difficulties and is otherwise without complaint today.   Past Medical History:  Diagnosis Date  . Anxiety   . B12 deficiency   . Difficult intubation    "SMALL TRACHEA"  . GERD (gastroesophageal reflux disease)   . History of kidney stones    H/O  . OSA (obstructive sleep apnea)    USES  CPAP ( rarely)  . Paroxysmal atrial fibrillation [facility])    Past Surgical History:  Procedure Laterality Date  . ATRIAL FIBRILLATION ABLATION N/A 03/25/2020   Procedure: ATRIAL FIBRILLATION ABLATION;  Surgeon: Hillis Range, MD;  [facility] INV[facility];  Service: Cardiovascular;  Laterality: N/A;  . AUGMENTATION MAMMAPLASTY  2003  . BREAST BIOPSY Right 2005   benign  . COLONOSCOPY    . DILATATION & CURETTAGE/HYSTEROSCOPY WITH MYOSURE N/A 01/02/2018   Procedure: DILATATION &  CURETTAGE/HYSTEROSCOPY WITH MYOSURE, polypectomy;  Surgeon: Christeen DouglasBeasley, Bethany, MD;  Location: [facility] ORS;  Service: Gynecology;  Laterality: N/A;  . ESOPHAGOGASTRODUODENOSCOPY    . KNEE ARTHROSCOPY Left 12/02/2017   Procedure: ARTHROSCOPY KNEE WITH PARTIAL MEDIAL MENISECTOMY, REMOVAL OF LOOSE BODY;  Surgeon: Signa KellPatel, Sunny, MD;  Location: [facility] ORS;  Service: Orthopedics;  Laterality: Left;  . REDUCTION MAMMAPLASTY  2007  . TUMMY TUCK  2006    Current Outpatient Medications  Medication Sig Dispense Refill  . acetaminophen (TYLENOL) 500 MG tablet Take 500 mg by mouth every 6 (six) hours as needed for mild pain, moderate pain or headache.    . ALPRAZolam (XANAX) 0.5 MG tablet Take 0.5-1 tablets (0.25-0.5 mg total) by mouth at bedtime as needed for anxiety. 30 tablet 0  . apixaban (ELIQUIS) 5 MG TABS tablet Take 1 tablet (5 mg total) by mouth 2 (two) times daily. 60 tablet 3  . atenolol (TENORMIN) 25 MG tablet TAKE 1 TABLET BY MOUTH TWICE A DAY. PLEASE KEEP UPCOMING APPT FOR FURTHER FILLS 180 tablet 3  . diphenhydrAMINE (BENADRYL) 25 MG tablet Take 25 mg by mouth every 6 (six) hours as needed for itching or allergies.    . Multiple Vitamins-Minerals (MULTIVITAMIN WITH MINERALS) tablet Take 2 tablets by mouth daily. Gummie    . pantoprazole (PROTONIX) 40 MG tablet Take 1 tablet (40 mg total) by mouth daily. 45 tablet 0   No current facility-administered medications for this encounter.    Allergies  Allergen Reactions  .  Codeine Nausea And Vomiting  . Ivp Dye [Iodinated Diagnostic Agents] Itching    Social History   Socioeconomic History  . Marital status: Divorced    Spouse name: Not on file  . Number of children: Not on file  . Years of education: Not on file  . Highest education level: Not on file  Occupational History  . Occupation: realtor  Tobacco Use  . Smoking status: Never Smoker  . Smokeless tobacco: Never Used  Vaping Use  . Vaping Use: Never used  Substance and Sexual Activity  . Alcohol use: Yes    Alcohol/week: 1.0 standard drink    Types: 1 Glasses of wine per week  . Drug use: Never  . Sexual activity: Not on file  Other Topics Concern  . Not on file  Social History Narrative   [place] in [place]    Works as a Community education officerrealtor   President of [employer]   Social Determinants of Health   Financial Resource Strain: Not on file  Food Insecurity: Not on file  Transportation Needs: Not on file  Physical Activity: Not on file  Stress: Not on file  Social Connections: Not on file  Intimate Partner Violence: Not on file    Family History  Problem Relation Age of Onset  . CAD Mother   . Heart disease Mother   . Pancreatic cancer Father     ROS- All systems are reviewed and negative except as per the HPI above  Physical Exam: Vitals:   04/22/20 1139  BP: 132/82  Pulse: 66  Weight: 103.1 kg  Height: 5' 5.5" (1.664 m)   Wt Readings from Last 3 Encounters:  04/22/20 103.1 kg  03/25/20 99.8 kg  02/27/20 99.8 kg    Labs: Lab Results  Component Value Date   NA 140 03/12/2020   K 4.3 03/12/2020   CL 103 03/12/2020   CO2 23 03/12/2020   GLUCOSE 94 03/12/2020   BUN 12 03/12/2020   CREATININE  0.73 03/12/2020   CALCIUM 8.7 03/12/2020   MG 2.0 02/18/2018   Lab Results  Component Value Date   INR 1.13 08/17/2017   Lab Results  Component Value Date   CHOL 220 (H) 02/07/2018   HDL 59 02/07/2018   LDLCALC 136 (H) 02/07/2018   TRIG 123 02/07/2018      GEN- The patient is well appearing, alert and oriented x 3 today.   Head- normocephalic, atraumatic Eyes-  Sclera clear, conjunctiva pink Ears- hearing intact Oropharynx- clear Neck- supple, no JVP Lymph- no cervical lymphadenopathy Lungs- Clear to ausculation bilaterally, normal work of breathing Heart-  regular rate and rhythm, no murmurs, rubs or gallops, PMI not laterally displaced GI- soft, NT, ND, + BS Extremities- no clubbing, cyanosis, or edema MS- no significant deformity or atrophy Skin- no rash or lesion Psych- euthymic mood, full affect Neuro- strength and sensation are intact  EKG-sinus brady at 66 bpm, pr int 130 ms, qrs int 83 ms, qtc 452 ms     Assessment and Plan: 1.Symptomatic  paroxysmal afib with RVR Needed 2 urgent cardioversion's in the last year, last one in September 2021, has high v rates with soft BP's making rate control difficult  She  had an afib ablation one month ago and is maintaining  SR, very happy with the results  Continue eliquis 5 mg bid,  CHA2DS2VASc score of 1 (female)  She  has taken Multaq in the past, did not want to continue daily AAD  Continue  Atenolol  25 mg bid   F/u  with Dr. Johney Frame June 8th   Lupita Leash [facility] [address] [place], [place] [address] [phone]

## 2020-05-02 ENCOUNTER — Other Ambulatory Visit: Payer: Self-pay | Admitting: Internal Medicine

## 2020-06-08 ENCOUNTER — Other Ambulatory Visit (HOSPITAL_COMMUNITY): Payer: Self-pay | Admitting: Nurse Practitioner

## 2020-07-02 ENCOUNTER — Ambulatory Visit: Payer: 59 | Admitting: Internal Medicine

## 2020-07-02 DIAGNOSIS — I480 Paroxysmal atrial fibrillation: Secondary | ICD-10-CM

## 2020-07-02 DIAGNOSIS — G4733 Obstructive sleep apnea (adult) (pediatric): Secondary | ICD-10-CM

## 2020-07-09 ENCOUNTER — Other Ambulatory Visit: Payer: Self-pay | Admitting: Obstetrics and Gynecology

## 2020-07-09 DIAGNOSIS — Z1231 Encounter for screening mammogram for malignant neoplasm of breast: Secondary | ICD-10-CM

## 2020-07-14 ENCOUNTER — Ambulatory Visit (INDEPENDENT_AMBULATORY_CARE_PROVIDER_SITE_OTHER): Payer: 59 | Admitting: Internal Medicine

## 2020-07-14 ENCOUNTER — Other Ambulatory Visit: Payer: Self-pay

## 2020-07-14 ENCOUNTER — Encounter: Payer: Self-pay | Admitting: Internal Medicine

## 2020-07-14 VITALS — BP 112/78 | HR 77 | Ht 65.5 in | Wt 225.6 lb

## 2020-07-14 DIAGNOSIS — I480 Paroxysmal atrial fibrillation: Secondary | ICD-10-CM | POA: Diagnosis not present

## 2020-07-14 DIAGNOSIS — G4733 Obstructive sleep apnea (adult) (pediatric): Secondary | ICD-10-CM | POA: Diagnosis not present

## 2020-07-14 MED ORDER — ATENOLOL 25 MG PO TABS: 25.0000 mg | ORAL_TABLET | Freq: Every day | ORAL | 3 refills | Status: DC

## 2020-07-14 NOTE — Patient Instructions (Addendum)
Medication Instructions:  Stop Eliquis  Stop Protonix Reduce Atenolol to 25 mg daily Your physician recommends that you continue on your current medications as directed. Please refer to the Current Medication list given to you today.  Labwork: None ordered.  Testing/Procedures: None ordered.  Follow-Up: Your physician wants you to follow-up in: 10/15/20 at 2:45 pm with Dr. Johney Frame.   Any Other Special Instructions Will Be Listed Below (If Applicable).  If you need a refill on your cardiac medications before your next appointment, please call your pharmacy.

## 2020-07-14 NOTE — Progress Notes (Signed)
PCP: Patrice Paradise, MD  Tracey Lin is a 60 y.o. female who presents today for routine electrophysiology followup.  Since his recent afib ablation, the patient reports doing very well.  she denies procedure related complications and is pleased with the results of the procedure.  Today, she denies symptoms of palpitations, chest pain, shortness of breath,  lower extremity edema, dizziness, presyncope, or syncope.  The patient is otherwise without complaint today.   Past Medical History:  Diagnosis Date   Anxiety    B12 deficiency    Difficult intubation    "SMALL TRACHEA"   GERD (gastroesophageal reflux disease)    History of kidney stones    H/O   OSA (obstructive sleep apnea)    USES CPAP ( rarely)   Paroxysmal atrial fibrillation (HCC)    Past Surgical History:  Procedure Laterality Date   ATRIAL FIBRILLATION ABLATION N/A 03/25/2020   Procedure: ATRIAL FIBRILLATION ABLATION;  Surgeon: Hillis Range, MD;  [facility];  Service: Cardiovascular;  Laterality: N/A;   AUGMENTATION MAMMAPLASTY  2003   BREAST BIOPSY Right 2005   benign   COLONOSCOPY     DILATATION & CURETTAGE/HYSTEROSCOPY WITH MYOSURE N/A 01/02/2018   Procedure: DILATATION & CURETTAGE/HYSTEROSCOPY WITH MYOSURE, polypectomy;  Surgeon: Christeen Douglas, MD;  Location: [facility] ORS;  Service: Gynecology;  Laterality: N/A;   ESOPHAGOGASTRODUODENOSCOPY     KNEE ARTHROSCOPY Left 12/02/2017   Procedure: ARTHROSCOPY KNEE WITH PARTIAL MEDIAL MENISECTOMY, REMOVAL OF LOOSE BODY;  Surgeon: Signa Kell, MD;  Location: [facility] ORS;  Service: Orthopedics;  Laterality: Left;   REDUCTION MAMMAPLASTY  2007   TUMMY TUCK  2006    ROS- all systems are personally reviewed and negatives except as per HPI above  Current Outpatient Medications  Medication Sig Dispense Refill   acetaminophen (TYLENOL) 500 MG tablet Take 500 mg by mouth every 6 (six) hours as needed for mild pain, moderate pain or headache.      ALPRAZolam (XANAX) 0.5 MG tablet Take 0.5-1 tablets (0.25-0.5 mg total) by mouth at bedtime as needed for anxiety. 30 tablet 0   atenolol (TENORMIN) 25 MG tablet Take 1 tablet (25 mg total) by mouth 2 (two) times daily. 180 tablet 1   diphenhydrAMINE (BENADRYL) 25 MG tablet Take 25 mg by mouth every 6 (six) hours as needed for itching or allergies.     ELIQUIS 5 MG TABS tablet TAKE 1 TABLET BY MOUTH TWICE A DAY 60 tablet 6   Multiple Vitamins-Minerals (MULTIVITAMIN WITH MINERALS) tablet Take 2 tablets by mouth daily. Gummie     pantoprazole (PROTONIX) 40 MG tablet TAKE 1 TABLET BY MOUTH EVERY DAY 45 tablet 2   No current facility-administered medications for this visit.    Physical Exam: Vitals:   07/14/20 1410  BP: 112/78  Pulse: 77  SpO2: 96%  Weight: 225 lb 9.6 oz (102.3 kg)  Height: 5' 5.5" (1.664 m)    GEN- The patient is well appearing, alert and oriented x 3 today.   Head- normocephalic, atraumatic Eyes-  Sclera clear, conjunctiva pink Ears- hearing intact Oropharynx- clear Lungs- Clear to ausculation bilaterally, normal work of breathing Heart- Regular rate and rhythm, no murmurs, rubs or gallops, PMI not laterally displaced GI- soft, NT, ND, + BS Extremities- no clubbing,  or edema  EKG tracing ordered today is personally reviewed and shows sinus  Assessment and Plan:  1. Paroxysmal atrial fibrillation Doing well s/p ablation off AAD chads2vasc score is 1 We discussed AHA guidelines  at length.  She is clear that she wishes to stop eliquis Reduce atenolol to 25mg  daily  2. OSA Compliance with CPAP advised  3. Obesity Body mass index is 36.97 kg/m. Lifestyle modification advised   Return to see me in 3 months  MD, [facility] 07/14/2020 2:22 PM

## 2020-07-15 ENCOUNTER — Other Ambulatory Visit: Payer: Self-pay | Admitting: Neurology

## 2020-07-16 ENCOUNTER — Other Ambulatory Visit: Payer: Self-pay

## 2020-07-16 ENCOUNTER — Ambulatory Visit
Admission: RE | Admit: 2020-07-16 | Discharge: 2020-07-16 | Disposition: A | Discharge status: Home / Self Care | Payer: 59 | Source: Ambulatory Visit | Attending: Obstetrics and Gynecology | Admitting: Obstetrics and Gynecology

## 2020-07-16 DIAGNOSIS — Z1231 Encounter for screening mammogram for malignant neoplasm of breast: Secondary | ICD-10-CM | POA: Diagnosis not present

## 2020-07-18 ENCOUNTER — Telehealth (HOSPITAL_COMMUNITY): Payer: Self-pay | Admitting: *Deleted

## 2020-07-18 NOTE — Telephone Encounter (Signed)
Pt called in stating having intermittent heart racing since yesterday (reduced at office visit with Dr Johney Frame on 6/20). She's had a very busy week with work with lots of driving and little rest. Also has some intermittent arm tingling but does endorse cervical spine issues. Tylenol and xanax did help her symptoms in that aspect.  Pt will increase atenolol back to previous dose for the next several days and see if this helps her intermittent palpations. She will let our office know if symptoms continue and will bring in for evaluation.

## 2020-07-27 ENCOUNTER — Other Ambulatory Visit: Payer: Self-pay | Admitting: Internal Medicine

## 2020-07-30 ENCOUNTER — Ambulatory Visit (HOSPITAL_BASED_OUTPATIENT_CLINIC_OR_DEPARTMENT_OTHER): Payer: 59 | Admitting: Internal Medicine

## 2020-07-30 NOTE — Telephone Encounter (Signed)
Pt's pharmacy is requesting a refill on pantoprazole. Would Dr. Allred like to refill this medication? Please address 

## 2020-10-15 ENCOUNTER — Ambulatory Visit (INDEPENDENT_AMBULATORY_CARE_PROVIDER_SITE_OTHER): Payer: 59 | Admitting: Internal Medicine

## 2020-10-15 ENCOUNTER — Other Ambulatory Visit: Payer: Self-pay

## 2020-10-15 VITALS — BP 116/74 | HR 73 | Ht 65.0 in | Wt 221.0 lb

## 2020-10-15 DIAGNOSIS — G4733 Obstructive sleep apnea (adult) (pediatric): Secondary | ICD-10-CM

## 2020-10-15 DIAGNOSIS — I480 Paroxysmal atrial fibrillation: Secondary | ICD-10-CM | POA: Diagnosis not present

## 2020-10-15 MED ORDER — ATENOLOL 25 MG PO TABS: ORAL_TABLET | ORAL | 0 refills | Status: DC

## 2020-10-15 NOTE — Patient Instructions (Addendum)
Medication Instructions:  Reduce Atenolol to 12.5 mg daily for 2 months , then ever other day for 2 weeks then stop.  Your physician recommends that you continue on your current medications as directed. Please refer to the Current Medication list given to you today.  Labwork: None ordered.  Testing/Procedures: None ordered.  Follow-Up: Your physician wants you to follow-up in: 6 months in the Afib Clinic. They will contact you to schedule.    Any Other Special Instructions Will Be Listed Below (If Applicable).  If you need a refill on your cardiac medications before your next appointment, please call your pharmacy.

## 2020-10-15 NOTE — Progress Notes (Signed)
PCP: Patrice Paradise, MD   Primary EP: Dr Johney Frame  Tracey Lin is a 60 y.o. female who presents today for routine electrophysiology followup.  Since last being seen in our clinic, the patient reports doing very well.  Today, she denies symptoms of palpitations, chest pain, shortness of breath,  lower extremity edema, dizziness, presyncope, or syncope.  The patient is otherwise without complaint today.   Past Medical History:  Diagnosis Date   Anxiety    B12 deficiency    Difficult intubation    "SMALL TRACHEA"   GERD (gastroesophageal reflux disease)    History of kidney stones    H/O   OSA (obstructive sleep apnea)    USES CPAP ( rarely)   Paroxysmal atrial fibrillation (HCC)    Past Surgical History:  Procedure Laterality Date   ATRIAL FIBRILLATION ABLATION N/A 03/25/2020   Procedure: ATRIAL FIBRILLATION ABLATION;  Surgeon: Hillis Range, MD;  [facility];  Service: Cardiovascular;  Laterality: N/A;   AUGMENTATION MAMMAPLASTY  2003   BREAST BIOPSY Right 2005   benign   COLONOSCOPY     DILATATION & CURETTAGE/HYSTEROSCOPY WITH MYOSURE N/A 01/02/2018   Procedure: DILATATION & CURETTAGE/HYSTEROSCOPY WITH MYOSURE, polypectomy;  Surgeon: Christeen Douglas, MD;  Location: [facility] ORS;  Service: Gynecology;  Laterality: N/A;   ESOPHAGOGASTRODUODENOSCOPY     KNEE ARTHROSCOPY Left 12/02/2017   Procedure: ARTHROSCOPY KNEE WITH PARTIAL MEDIAL MENISECTOMY, REMOVAL OF LOOSE BODY;  Surgeon: Signa Kell, MD;  Location: [facility] ORS;  Service: Orthopedics;  Laterality: Left;   REDUCTION MAMMAPLASTY  2007   TUMMY TUCK  2006    ROS- all systems are reviewed and negatives except as per HPI above  Current Outpatient Medications  Medication Sig Dispense Refill   acetaminophen (TYLENOL) 500 MG tablet Take 500 mg by mouth every 6 (six) hours as needed for mild pain, moderate pain or headache.     ALPRAZolam (XANAX) 0.5 MG tablet Take 0.5-1 tablets (0.25-0.5 mg total) by mouth at  bedtime as needed for anxiety. 30 tablet 0   atenolol (TENORMIN) 25 MG tablet Take 1 tablet (25 mg total) by mouth daily. 90 tablet 3   diphenhydrAMINE (BENADRYL) 25 MG tablet Take 25 mg by mouth every 6 (six) hours as needed for itching or allergies.     Multiple Vitamins-Minerals (MULTIVITAMIN WITH MINERALS) tablet Take 2 tablets by mouth daily. Gummie     pantoprazole (PROTONIX) 40 MG tablet TAKE 1 TABLET BY MOUTH EVERY DAY (Patient taking differently: Take 40 mg by mouth as needed.) 45 tablet 2   No current facility-administered medications for this visit.    Physical Exam: Vitals:   10/15/20 1451  BP: 116/74  Pulse: 73  SpO2: 96%  Weight: 221 lb (100.2 kg)  Height: 5\' 5"  (1.651 m)    GEN- The patient is well appearing, alert and oriented x 3 today.   Head- normocephalic, atraumatic Eyes-  Sclera clear, conjunctiva pink Ears- hearing intact Oropharynx- clear Lungs- Clear to ausculation bilaterally, normal work of breathing Heart- Regular rate and rhythm, no murmurs, rubs or gallops, PMI not laterally displaced GI- soft, NT, ND, + BS Extremities- no clubbing, cyanosis, or edema  Wt Readings from Last 3 Encounters:  10/15/20 221 lb (100.2 kg)  07/14/20 225 lb 9.6 oz (102.3 kg)  04/22/20 227 lb 6.4 oz (103.1 kg)    EKG tracing ordered today is personally reviewed and shows sinus  Assessment and Plan:  Paroxysmal atrial fibrillation Doing well post ablation off  AAD Chads2vasc score is 1.  She does not require OAC as per guidelines wean atenolol to 12.5mg  daily x 2 months then 12.5mg  QOD x 2 weeks, then discontinue  2. Obesity Body mass index is 36.78 kg/m. Lifestyle modification advised  3. OSA Compliance with CPAP advised.  Unfortunately, she is clear that she will not use her CPAP.  Risks, benefits and potential toxicities for medications prescribed and/or refilled reviewed with patient today.   Follow-up in AF clinic in 6 months  Hillis Range MD,  [facility] 10/15/2020 3:23 PM

## 2021-02-18 ENCOUNTER — Telehealth (HOSPITAL_COMMUNITY): Payer: Self-pay | Admitting: *Deleted

## 2021-02-18 MED ORDER — ATENOLOL 25 MG PO TABS: 12.5000 mg | ORAL_TABLET | Freq: Every day | ORAL | 0 refills | Status: DC

## 2021-02-18 NOTE — Telephone Encounter (Signed)
Patient called in stating over the weekend she was in AF most of the weekend. Unsure of trigger for episodes. Currently off all rate control meds post ablation. Per Rudi Coco NP resume atenolol 12.5mg  once a day - call with update after resuming if episodes continue. Pt in agreement.

## 2021-03-05 DIAGNOSIS — I480 Paroxysmal atrial fibrillation: Secondary | ICD-10-CM | POA: Diagnosis not present

## 2021-03-05 DIAGNOSIS — G4733 Obstructive sleep apnea (adult) (pediatric): Secondary | ICD-10-CM | POA: Diagnosis not present

## 2021-03-05 DIAGNOSIS — E559 Vitamin D deficiency, unspecified: Secondary | ICD-10-CM | POA: Diagnosis not present

## 2021-03-05 DIAGNOSIS — E538 Deficiency of other specified B group vitamins: Secondary | ICD-10-CM | POA: Diagnosis not present

## 2021-03-05 DIAGNOSIS — R5382 Chronic fatigue, unspecified: Secondary | ICD-10-CM | POA: Diagnosis not present

## 2021-03-27 ENCOUNTER — Other Ambulatory Visit: Payer: Self-pay | Admitting: Internal Medicine

## 2021-03-27 NOTE — Addendum Note (Signed)
Addended by: Roney Mans A on: 03/27/2021 11:26 AM   Modules accepted: Orders

## 2021-03-27 NOTE — Telephone Encounter (Signed)
Pt's pharmacy is requesting a refill on pantoprazole. Would Dr. Allred like to refill this medication? Please address 

## 2021-04-01 ENCOUNTER — Ambulatory Visit (HOSPITAL_COMMUNITY)
Admission: RE | Admit: 2021-04-01 | Discharge: 2021-04-01 | Disposition: A | Discharge status: Home / Self Care | Payer: 59 | Source: Ambulatory Visit | Attending: Nurse Practitioner | Admitting: Nurse Practitioner

## 2021-04-01 ENCOUNTER — Encounter (HOSPITAL_COMMUNITY): Payer: Self-pay | Admitting: Nurse Practitioner

## 2021-04-01 ENCOUNTER — Other Ambulatory Visit: Payer: Self-pay

## 2021-04-01 ENCOUNTER — Other Ambulatory Visit (HOSPITAL_COMMUNITY): Payer: Self-pay | Admitting: Nurse Practitioner

## 2021-04-01 VITALS — BP 122/74 | HR 78 | Ht 65.0 in | Wt 217.4 lb

## 2021-04-01 DIAGNOSIS — Z7982 Long term (current) use of aspirin: Secondary | ICD-10-CM | POA: Insufficient documentation

## 2021-04-01 DIAGNOSIS — I480 Paroxysmal atrial fibrillation: Secondary | ICD-10-CM | POA: Insufficient documentation

## 2021-04-01 DIAGNOSIS — Z79899 Other long term (current) drug therapy: Secondary | ICD-10-CM | POA: Diagnosis not present

## 2021-04-01 DIAGNOSIS — Z9989 Dependence on other enabling machines and devices: Secondary | ICD-10-CM | POA: Diagnosis not present

## 2021-04-01 DIAGNOSIS — I482 Chronic atrial fibrillation, unspecified: Secondary | ICD-10-CM | POA: Insufficient documentation

## 2021-04-01 DIAGNOSIS — G4733 Obstructive sleep apnea (adult) (pediatric): Secondary | ICD-10-CM | POA: Insufficient documentation

## 2021-04-01 MED ORDER — DILTIAZEM HCL 30 MG PO TABS: ORAL_TABLET | ORAL | 1 refills | Status: DC

## 2021-04-01 NOTE — Progress Notes (Signed)
Primary Care Physician: Marinda Elk, MD Referring Physician:  [name] is a 61 y.o. female with a h/o paroxysmal afib, sleep apnea, treated with CPAP that is in the afib clinic for f/u hospitalization for afib while in [place], [place] to attend a [employer] rally in 2019. She was walking to her car and  was aware of increased HR, dizziness and lightheadedness and was taken to [facility].  She had been out in the sun for about 30 mins and her fluid intake had been less than usual  for the day. Her HR was 170 bpm. She was thought to have mild dehydration.She was admitted, placed on Cardizem drip for a couple of days then started on Multaq, ranexa, atenolol, eliquis and did convert to SR prior to discharge. The MD told pt that she would need quick f/u as multaq was for short term as it could cause liver failure. She is planning to see Dr. [name] soon. She would like to discuss coming off drugs and a possible ablation down the line. CHA2DS2VASc score is 1 for female. She would like to get clearance to have her knee surgery as she wants this first so she can get back to exercising to obtain wight loss. Echo was done at [facility] and with normal results( all records can be found in [place]).   She has had afib for many years but it had been quiet, treated in the [place] area until she moved to [place] 6 years ago. She saw Dr. [name] a couple of times but has not seen him since 2015.  She was suppose to have knee surgery last week but it was cancelled.  She states that she has gained  40 lbs over the last 6 years. She does use cpap, but still sleeps very poorly. Feels that she does get at least 4 hours a night use.  F/u in afib clinic 9/9. When she saw Dr. [name], he advised her to start weaning off the ranexa, multaq and DOAC which she has successfully done. She  feels well. No further afib. She did have a SBO which self released right after I saw her last in  clinic. No further reoccurrence.   F/u in afib clinic 02/22/18, as she had a ER visit for afib and went to the ER for a cardioversion. She had multaq and eliquis on hand and took these but did not help to restore SR.  The cardioversion was successful and she is in SR today. She still would prefer to stay off daily meds . She has talked to Dr. [name] about an ablation in the past but is still not quite ready for this either unless afib burden increases. She is seeing a weight loss specialist. She has not used her cpap for sleep apnea in some time, cannot tolerate mask. She thinks the trigger was lack of sleep and working long hours.   F/u in AF clinic 07/11/18. Patient reports that last night she began having feelings of SOB, weakness, and heart racing. In office today she is in afib with RVR and is very SOB, especially when walking. She has not passed out but has also been very lightheaded. She is not on Eliquis or Multaq at this time.  She was sent to the ER for urgent cardioversion.  F/u afib clinic, 07/21/18. She is s/p successful  CV and is staying in rhythm. She remains on Multaq and Eliquis but does not want to  stay on daily long term. She cannot tolerate the cpap and states that she sleeps poorly. She has stopped going to the weight loss MD as it was inconvenient for her in [place]. She has gained weight with Covid and working from home.   F/u in afib clinic, 03/30/19. She  had return of rapid afib last night with difficulty breathing and lightheadedness and asked to be seen today. She  in in afib at 138 bpm. BP soft around [id number] systolic.  She is not on anticoagulation  for a CHA2DS2VASc score of 1. Last time I saw her in June she required an urgent cardioversion. Was on Multaq for a while but stopped in August 2020. She takes anticoagulation for the 4 weeks after cardioversion but then stops drug per protocol.  She did have an episode last week for a few hours of elevated HR but self converted. Discussed with  Dr. [name] and he felt she should proceed to the ER for an urgent cardioversion as she is not on anticoagulation and is in the 48 hours of onset afib. Has not used cpap for OSA in past.  F/u in afib clinic, 04/13/19.  after successful cardioversion in the ER. She continues on eliquis 5 mg bid for post cardioversion protocol. Discussion today if she prefers to go back on Multaq bid to help prevent afib as she has had 2 cardioversions 6 months apart, or if she wants to discuss ablation with Dr. [name]. She prefers to discuss with Dr. [name] as she does not like to take meds. Will need updated echo.   F/u in afib clinic, 01/07/20. She had an appointment with Dr. [name] 3 months ago but no change in approach to afib management was made. She is in SR today. No  further afib episodes to report. She is in SR today. She continues on atenolol,  CHA2DS2VASc score of 1, on daily asa and off eliquis after 4 weeks following last  cardioversion. She previously took Clinical cytogeneticist, it worked well but she did not want to continue daily meds. She continues use of CPAP. She would prefer not to pursue ablation at this time.   F/u in afib clinic, 04/23/19. She is now one month s/p afib ablation and is very happy with the results. She has not noted any afib. No swallowing or groin issues. She is back to her usual activities. Remains on eliquis 5 mg bid for a CHA2DS2VASc score of 1.   F/u in afib clinic, 04/01/21, as she reported having 2-3 breakthrough afib episodes. The longest was 3 days. She was under some additional stress at that time. She is in SR today. Sheis not oon anticoagulation by guidelines with a CHA2DS2VASc  score of 1.    Today, she denies symptoms of palpitations, chest pain,+ shortness of breath/lightedness, no orthopnea, PND, lower extremity edema, dizziness, presyncope, syncope, or neurologic sequela. The patient is tolerating medications without difficulties and is otherwise without complaint today.   Past Medical  History:  Diagnosis Date   Anxiety    B12 deficiency    Difficult intubation    "SMALL TRACHEA"   GERD (gastroesophageal reflux disease)    History of kidney stones    H/O   OSA (obstructive sleep apnea)    USES CPAP ( rarely)   Paroxysmal atrial fibrillation ([place])    Past Surgical History:  Procedure Laterality Date   ATRIAL FIBRILLATION ABLATION N/A 03/25/2020   Procedure: ATRIAL FIBRILLATION ABLATION;  Surgeon: Thompson Grayer, MD;  Location: [facility];  Service: Cardiovascular;  Laterality: N/A;   AUGMENTATION MAMMAPLASTY  2003   BREAST BIOPSY Right 2005   benign   COLONOSCOPY     DILATATION & CURETTAGE/HYSTEROSCOPY WITH MYOSURE N/A 01/02/2018   Procedure: [place], polypectomy;  Surgeon: Benjaman Kindler, MD;  Location: [facility];  Service: Gynecology;  Laterality: N/A;   ESOPHAGOGASTRODUODENOSCOPY     KNEE ARTHROSCOPY Left 12/02/2017   Procedure: ARTHROSCOPY KNEE WITH PARTIAL MEDIAL MENISECTOMY, REMOVAL OF LOOSE BODY;  Surgeon: Leim Fabry, MD;  Location: [facility];  Service: Orthopedics;  Laterality: Left;   REDUCTION MAMMAPLASTY  2007   TUMMY TUCK  2006    Current Outpatient Medications  Medication Sig Dispense Refill   acetaminophen (TYLENOL) 500 MG tablet Take 500 mg by mouth as needed for mild pain, moderate pain or headache.     ALPRAZolam (XANAX) 0.5 MG tablet Take 0.5-1 tablets (0.25-0.5 mg total) by mouth at bedtime as needed for anxiety. 30 tablet 0   aspirin EC 81 MG tablet Take 81 mg by mouth as needed (for chest pain- for her A-fib). Swallow whole.     atenolol (TENORMIN) 25 MG tablet Take 0.5 tablets (12.5 mg total) by mouth daily. 28 tablet 0   diltiazem (CARDIZEM) 30 MG tablet Take 1 tablet every 4 hours AS NEEDED for AFIB heart rate >100 30 tablet 1   MAGNESIUM PO Take 400 mg by mouth at bedtime.     Multiple Vitamins-Minerals (MULTIVITAMIN WITH MINERALS) tablet Take 2 tablets by mouth daily. Gummie     zolpidem  (AMBIEN) 5 MG tablet Take 5 mg by mouth at bedtime as needed.     No current facility-administered medications for this encounter.    Allergies  Allergen Reactions   Codeine Nausea And Vomiting   Ivp Dye [Iodinated Contrast Media] Itching    Social History   Socioeconomic History   Marital status: Divorced    Spouse name: Not on file   Number of children: Not on file   Years of education: Not on file   Highest education level: Not on file  Occupational History   Occupation: realtor  Tobacco Use   Smoking status: Never   Smokeless tobacco: Never  Vaping Use   Vaping Use: Never used  Substance and Sexual Activity   Alcohol use: Yes    Alcohol/week: 1.0 standard drink    Types: 1 Glasses of wine per week   Drug use: Never   Sexual activity: Not on file  Other Topics Concern   Not on file  Social History Narrative   Lives in [place]    Works as a Producer, 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   Social Determinants of Health   Financial Resource Strain: Not on file  Food Insecurity: Not on file  Transportation Needs: Not on file  Physical Activity: Not on file  Stress: Not on file  Social Connections: Not on file  Intimate Partner Violence: Not on file    Family History  Problem Relation Age of Onset   CAD Mother    Heart disease Mother    Pancreatic cancer Father     ROS- All systems are reviewed and negative except as per the HPI above  Physical Exam: Vitals:   04/01/21 1334  BP: 122/74  Pulse: 78  Weight: 98.6 kg  Height: 5\' 5"  (1.651 m)   Wt Readings from Last 3 Encounters:  04/01/21 98.6 kg  10/15/20 100.2 kg  07/14/20 102.3 kg  Labs: Lab Results  Component Value Date   NA 140 03/12/2020   K 4.3 03/12/2020   CL 103 03/12/2020   CO2 23 03/12/2020   GLUCOSE 94 03/12/2020   BUN 12 03/12/2020   CREATININE 0.73 03/12/2020   CALCIUM 8.7 03/12/2020   MG 2.0 02/18/2018   Lab Results  Component Value Date   INR 1.13 08/17/2017    Lab Results  Component Value Date   CHOL 220 (H) 02/07/2018   HDL 59 02/07/2018   LDLCALC 136 (H) 02/07/2018   TRIG 123 02/07/2018     GEN- The patient is well appearing, alert and oriented x 3 today.   Head- normocephalic, atraumatic Eyes-  Sclera clear, conjunctiva pink Ears- hearing intact Oropharynx- clear Neck- supple, no JVP Lymph- no cervical lymphadenopathy Lungs- Clear to ausculation bilaterally, normal work of breathing Heart-  regular rate and rhythm, no murmurs, rubs or gallops, PMI not laterally displaced GI- soft, NT, ND, + BS Extremities- no clubbing, cyanosis, or edema MS- no significant deformity or atrophy Skin- no rash or lesion Psych- euthymic mood, full affect Neuro- strength and sensation are intact  EKG -Vent. rate 78 BPM PR interval 126 ms QRS duration 84 ms QT/QTcB 398/453 ms P-R-T axes 58 -12 58 Normal sinus rhythm Minimal voltage criteria for LVH, may be normal variant ( R in aVL ) Borderline ECG When compared with ECG of 22-Apr-2020 11:43, PREVIOUS ECG IS PRESENT    Assessment and Plan: 1.Symptomatic  paroxysmal afib with RVR Now s/p ablation one year ago Had been maintaining  SR until this past January with now 3 breakthrough episodes  She does not want daily AAD's Will try 30 mg Cardizem to use every 4 hours  for breakthrough afib  Not on anticoagulation for CHA2DS2VASc score of 1 (female)  Continue  Atenolol 12.5 mg qd If afib burden escalates, she may consider redo ablation   F/u  afib clinic in 9 months   Tracey Lin, [facility] [address] [place], [place] [address] [phone]

## 2021-04-07 DIAGNOSIS — E538 Deficiency of other specified B group vitamins: Secondary | ICD-10-CM | POA: Diagnosis not present

## 2021-06-04 ENCOUNTER — Encounter (HOSPITAL_BASED_OUTPATIENT_CLINIC_OR_DEPARTMENT_OTHER): Payer: Self-pay | Admitting: Internal Medicine

## 2021-06-04 ENCOUNTER — Ambulatory Visit (HOSPITAL_BASED_OUTPATIENT_CLINIC_OR_DEPARTMENT_OTHER): Payer: 59 | Admitting: Internal Medicine

## 2021-06-04 VITALS — BP 126/82 | HR 75 | Ht 65.0 in | Wt 216.0 lb

## 2021-06-04 DIAGNOSIS — G4733 Obstructive sleep apnea (adult) (pediatric): Secondary | ICD-10-CM

## 2021-06-04 DIAGNOSIS — I480 Paroxysmal atrial fibrillation: Secondary | ICD-10-CM

## 2021-06-04 DIAGNOSIS — D6869 Other thrombophilia: Secondary | ICD-10-CM

## 2021-06-04 NOTE — Progress Notes (Signed)
? ? ?PCP: Marinda Elk, MD ?  ?Primary EP: Dr [name] ? ?Tracey Lin is a 61 y.o. female who presents today for routine electrophysiology followup.  Since last being seen in our clinic, the patient reports doing reasonably well.  Over the past few months, her afib has returned.  + palpitations and fatigue. Today, she denies symptoms of palpitations, chest pain, shortness of breath,  lower extremity edema, dizziness, presyncope, or syncope.  The patient is otherwise without complaint today.  ? ?Past Medical History:  ?Diagnosis Date  ? Anxiety   ? B12 deficiency   ? Difficult intubation   ? "SMALL TRACHEA"  ? GERD (gastroesophageal reflux disease)   ? History of kidney stones   ? H/O  ? OSA (obstructive sleep apnea)   ? USES CPAP ( rarely)  ? Paroxysmal atrial fibrillation ([facility])   ? ?Past Surgical History:  ?Procedure Laterality Date  ? ATRIAL FIBRILLATION ABLATION N/A 03/25/2020  ? Procedure: ATRIAL FIBRILLATION ABLATION;  Surgeon: Thompson Grayer, MD;  Location: [facility];  Service: Cardiovascular;  Laterality: N/A;  ? AUGMENTATION MAMMAPLASTY  2003  ? BREAST BIOPSY Right 2005  ? benign  ? COLONOSCOPY    ? DILATATION & CURETTAGE/HYSTEROSCOPY WITH MYOSURE N/A 01/02/2018  ? Procedure: [place], polypectomy;  Surgeon: Benjaman Kindler, MD;  Location: [facility];  Service: Gynecology;  Laterality: N/A;  ? ESOPHAGOGASTRODUODENOSCOPY    ? KNEE ARTHROSCOPY Left 12/02/2017  ? Procedure: ARTHROSCOPY KNEE WITH PARTIAL MEDIAL MENISECTOMY, REMOVAL OF LOOSE BODY;  Surgeon: Leim Fabry, MD;  Location: [facility];  Service: Orthopedics;  Laterality: Left;  ? REDUCTION MAMMAPLASTY  2007  ? TUMMY TUCK  2006  ? ? ?ROS- all systems are reviewed and negatives except as per HPI above ? ?Current Outpatient Medications  ?Medication Sig Dispense Refill  ? acetaminophen (TYLENOL) 500 MG tablet Take 500 mg by mouth as needed for mild pain, moderate pain or headache.    ? ALPRAZolam  (XANAX) 0.5 MG tablet Take 0.5-1 tablets (0.25-0.5 mg total) by mouth at bedtime as needed for anxiety. 30 tablet 0  ? MAGNESIUM PO Take 400 mg by mouth at bedtime.    ? Multiple Vitamins-Minerals (MULTIVITAMIN WITH MINERALS) tablet Take 2 tablets by mouth daily. Gummie    ? pantoprazole (PROTONIX) 40 MG tablet Take 40 mg by mouth daily.    ? zolpidem (AMBIEN) 5 MG tablet Take 5 mg by mouth at bedtime as needed.    ? atenolol (TENORMIN) 25 MG tablet Take 0.5 tablets (12.5 mg total) by mouth daily. 28 tablet 0  ? ?No current facility-administered medications for this visit.  ? ? ?Physical Exam: ?Vitals:  ? 06/04/21 1420  ?BP: 126/82  ?Pulse: 75  ?Weight: 216 lb (98 kg)  ?Height: 5\' 5"  (1.651 m)  ? ? ?GEN- The patient is well appearing, alert and oriented x 3 today.   ?Head- normocephalic, atraumatic ?Eyes-  Sclera clear, conjunctiva pink ?Ears- hearing intact ?Oropharynx- clear ?Lungs- Clear to ausculation bilaterally, normal work of breathing ?Heart- Regular rate and rhythm, no murmurs, rubs or gallops, PMI not laterally displaced ?GI- soft, NT, ND, + BS ?Extremities- no clubbing, cyanosis, or edema ? ?Wt Readings from Last 3 Encounters:  ?06/04/21 216 lb (98 kg)  ?04/01/21 217 lb 6.4 oz (98.6 kg)  ?10/15/20 221 lb (100.2 kg)  ? ? ?EKG tracing ordered today is personally reviewed and shows sinus, PR 128 msec ? ?Assessment and Plan: ? ?1.Marland Kitchen paroxysmal atrial fibrillation ?  She has done well post ablation until January.  Her afib has increased since that time. ?Chads2vasc score is 1.  She is not on [place]. ?The patient has symptomatic, recurrent atrial fibrillation. ? ?Therapeutic strategies for afib including medicine and ablation were discussed in detail with the patient today. Risk, benefits, and alternatives to EP study and radiofrequency ablation for afib were also discussed in detail today. These risks include but are not limited to stroke, bleeding, vascular damage, tamponade, perforation, damage to the esophagus,  lungs, and other structures, pulmonary vein stenosis, worsening renal function, and death. The patient understands these risk and wishes to proceed.  We will therefore proceed with catheter ablation at the next available time.  Carto, ICE, anesthesia are requested for the procedure.  Will also obtain cardiac CT prior to the procedure to exclude LAA thrombus and further evaluate atrial anatomy. ?Stop ASA and start eliquis 4 weeks prior to the procedure. ? ? ?2. Obesity ?Body mass index is 35.94 kg/m?. ?Lifestyle modification advised ? ?3. OSA ?Compliance with CPAP is advised ?She does not use her CPAP ?She wishes to consider ENT related surgery.  I will refer to Dr Redmond Baseman for further discussion. ? ?Risks, benefits and potential toxicities for medications prescribed and/or refilled reviewed with patient today.  ? ?Thompson Grayer MD, FACC ?06/04/2021 ?2:41 PM ? ? ? ? ?

## 2021-06-04 NOTE — Patient Instructions (Addendum)
Medication Instructions:  ?Your physician recommends that you continue on your current medications as directed. Please refer to the Current Medication list given to you today. ?*If you need a refill on your cardiac medications before your next appointment, please call your pharmacy* ? ?Lab Work: ?None ordered. ?If you have labs (blood work) drawn today and your tests are completely normal, you will receive your results only by: ?MyChart Message (if you have MyChart) OR ?A paper copy in the mail ?If you have any lab test that is abnormal or we need to change your treatment, we will call you to review the results. ? ?Testing/Procedures: ?Your physician has requested that you have cardiac CT. Cardiac computed tomography (CT) is a painless test that uses an x-ray machine to take clear, detailed pictures of your heart.  ? ?Your physician has recommended that you have an ablation. Catheter ablation is a medical procedure used to treat some cardiac arrhythmias (irregular heartbeats). During catheter ablation, a long, thin, flexible tube is put into a blood vessel in your groin (upper thigh), or neck. This tube is called an ablation catheter. It is then guided to your heart through the blood vessel. Radio frequency waves destroy small areas of heart tissue where abnormal heartbeats may cause an arrhythmia to start. Please see the instruction sheet given to you today. ? ?Follow-Up: ? ?You will be scheduled for an atrial fibrillation ablation. ? ?Cardiac Ablation ?Cardiac ablation is a procedure to destroy, or ablate, a small amount of heart tissue in very specific places. The heart has many electrical connections. Sometimes these connections are abnormal and can cause the heart to beat very fast or irregularly. Ablating some of the areas that cause problems can improve the heart's rhythm or return it to normal. Ablation may be done for people who: ?Have Wolff-Parkinson-White syndrome. ?Have fast heart rhythms  (tachycardia). ?Have taken medicines for an abnormal heart rhythm (arrhythmia) that were not effective or caused side effects. ?Have a high-risk heartbeat that may be life-threatening. ?During the procedure, a small incision is made in the neck or the groin, and a long, thin tube (catheter) is inserted into the incision and moved to the heart. Small devices (electrodes) on the tip of the catheter will send out electrical currents. A type of X-ray (fluoroscopy) will be used to help guide the catheter and to provide images of the heart. ?Tell a health care provider about: ?Any allergies you have. ?All medicines you are taking, including vitamins, herbs, eye drops, creams, and over-the-counter medicines. ?Any problems you or family members have had with anesthetic medicines. ?Any blood disorders you have. ?Any surgeries you have had. ?Any medical conditions you have, such as kidney failure. ?Whether you are pregnant or may be pregnant. ?What are the risks? ?Generally, this is a safe procedure. However, problems may occur, including: ?Infection. ?Bruising and bleeding at the catheter insertion site. ?Bleeding into the chest, especially into the sac that surrounds the heart. This is a serious complication. ?Stroke or blood clots. ?Damage to nearby structures or organs. ?Allergic reaction to medicines or dyes. ?Need for a permanent pacemaker if the normal electrical system is damaged. A pacemaker is a small computer that sends electrical signals to the heart and helps your heart beat normally. ?The procedure not being fully effective. This may not be recognized until months later. Repeat ablation procedures are sometimes done. ?What happens before the procedure? ?Medicines ?Ask your health care provider about: ?Changing or stopping your regular medicines. This is especially important  if you are taking diabetes medicines or blood thinners. ?Taking medicines such as aspirin and ibuprofen. These medicines can thin your  blood. Do not take these medicines unless your health care provider tells you to take them. ?Taking over-the-counter medicines, vitamins, herbs, and supplements. ?General instructions ?Follow instructions from your health care provider about eating or drinking restrictions. ?Plan to have someone take you home from the hospital or clinic. ?If you will be going home right after the procedure, plan to have someone with you for 24 hours. ?Ask your health care provider what steps will be taken to prevent infection. ?What happens during the procedure? ? ?An IV will be inserted into one of your veins. ?You will be given a medicine to help you relax (sedative). ?The skin on your neck or groin will be numbed. ?An incision will be made in your neck or your groin. ?A needle will be inserted through the incision and into a large vein in your neck or groin. ?A catheter will be inserted into the needle and moved to your heart. ?Dye may be injected through the catheter to help your surgeon see the area of the heart that needs treatment. ?Electrical currents will be sent from the catheter to ablate heart tissue in desired areas. There are three types of energy that may be used to do this: ?Heat (radiofrequency energy). ?Laser energy. ?Extreme cold (cryoablation). ?When the tissue has been ablated, the catheter will be removed. ?Pressure will be held on the insertion area to prevent a lot of bleeding. ?A bandage (dressing) will be placed over the insertion area. ?The exact procedure may vary among health care providers and hospitals. ?What happens after the procedure? ?Your blood pressure, heart rate, breathing rate, and blood oxygen level will be monitored until you leave the hospital or clinic. ?Your insertion area will be monitored for bleeding. You will need to lie still for a few hours to ensure that you do not bleed from the insertion area. ?Do not drive for 24 hours or as long as told by your health care  provider. ?Summary ?Cardiac ablation is a procedure to destroy, or ablate, a small amount of heart tissue using an electrical current. This procedure can improve the heart rhythm or return it to normal. ?Tell your health care provider about any medical conditions you may have and all medicines you are taking to treat them. ?This is a safe procedure, but problems may occur. Problems may include infection, bruising, damage to nearby organs or structures, or allergic reactions to medicines. ?Follow your health care provider's instructions about eating and drinking before the procedure. You may also be told to change or stop some of your medicines. ?After the procedure, do not drive for 24 hours or as long as told by your health care provider. ?This information is not intended to replace advice given to you by your health care provider. Make sure you discuss any questions you have with your health care provider. ?Document Revised: 11/20/2018 Document Reviewed: 11/20/2018 ?Elsevier Patient Education ? [place]. ? ?

## 2021-06-10 ENCOUNTER — Telehealth: Payer: Self-pay

## 2021-06-10 DIAGNOSIS — G4733 Obstructive sleep apnea (adult) (pediatric): Secondary | ICD-10-CM | POA: Diagnosis not present

## 2021-06-10 DIAGNOSIS — J343 Hypertrophy of nasal turbinates: Secondary | ICD-10-CM | POA: Diagnosis not present

## 2021-06-10 DIAGNOSIS — Z9989 Dependence on other enabling machines and devices: Secondary | ICD-10-CM | POA: Diagnosis not present

## 2021-06-10 NOTE — Telephone Encounter (Signed)
Schedule Pt for afib ablation with Dr. Johney Frame. ?

## 2021-06-11 DIAGNOSIS — Z131 Encounter for screening for diabetes mellitus: Secondary | ICD-10-CM | POA: Diagnosis not present

## 2021-06-11 DIAGNOSIS — I480 Paroxysmal atrial fibrillation: Secondary | ICD-10-CM | POA: Diagnosis not present

## 2021-06-11 DIAGNOSIS — Z0000 Encounter for general adult medical examination without abnormal findings: Secondary | ICD-10-CM | POA: Diagnosis not present

## 2021-06-15 DIAGNOSIS — G4733 Obstructive sleep apnea (adult) (pediatric): Secondary | ICD-10-CM | POA: Diagnosis not present

## 2021-06-15 DIAGNOSIS — R69 Illness, unspecified: Secondary | ICD-10-CM | POA: Diagnosis not present

## 2021-06-15 DIAGNOSIS — Z0000 Encounter for general adult medical examination without abnormal findings: Secondary | ICD-10-CM | POA: Diagnosis not present

## 2021-06-15 DIAGNOSIS — I480 Paroxysmal atrial fibrillation: Secondary | ICD-10-CM | POA: Diagnosis not present

## 2021-06-15 DIAGNOSIS — Z9989 Dependence on other enabling machines and devices: Secondary | ICD-10-CM | POA: Diagnosis not present

## 2021-06-15 DIAGNOSIS — Z6839 Body mass index (BMI) 39.0-39.9, adult: Secondary | ICD-10-CM | POA: Diagnosis not present

## 2021-06-15 DIAGNOSIS — E538 Deficiency of other specified B group vitamins: Secondary | ICD-10-CM | POA: Diagnosis not present

## 2021-06-15 DIAGNOSIS — E6609 Other obesity due to excess calories: Secondary | ICD-10-CM | POA: Diagnosis not present

## 2021-06-15 DIAGNOSIS — Z1211 Encounter for screening for malignant neoplasm of colon: Secondary | ICD-10-CM | POA: Diagnosis not present

## 2021-06-29 ENCOUNTER — Telehealth: Payer: Self-pay | Admitting: Internal Medicine

## 2021-06-29 NOTE — Telephone Encounter (Signed)
I spoke with patient and let her know Dr Jackalyn Lombard nurse would contact her to schedule procedure when she was back in the office.  Patient reports she had episode Saturday that left her feeling fatigued.

## 2021-06-29 NOTE — Telephone Encounter (Signed)
Patient is calling to schedule her ablation.

## 2021-07-01 NOTE — Telephone Encounter (Signed)
Follow Up:     Patient is calling to find out what test she needs to be schedule for before her procedure. She also needs to know what  date her procedure will be? She also needs to know when she needs to start taking  Eliquis before her procedure.

## 2021-07-01 NOTE — Telephone Encounter (Signed)
Pt aware nurse Ancil Boozer RN )  will call back next week to make arrangements for upcoming procedures ./cy

## 2021-07-06 NOTE — Telephone Encounter (Signed)
Returned call to Pt.  Advised did not have a July ablation date yet for Dr. Johney Frame.  Advised would call her Friday.

## 2021-07-06 NOTE — Telephone Encounter (Signed)
Pt calling back to f/u with nurse Elzie Rings) regarding getting scheduled for an Ablation. Pt states she has been calling for weeks and is awaiting a call back. Please advise

## 2021-07-07 DIAGNOSIS — Z6836 Body mass index (BMI) 36.0-36.9, adult: Secondary | ICD-10-CM | POA: Diagnosis not present

## 2021-07-07 DIAGNOSIS — M542 Cervicalgia: Secondary | ICD-10-CM | POA: Diagnosis not present

## 2021-07-07 DIAGNOSIS — M4722 Other spondylosis with radiculopathy, cervical region: Secondary | ICD-10-CM | POA: Diagnosis not present

## 2021-07-08 ENCOUNTER — Other Ambulatory Visit: Payer: Self-pay | Admitting: Physician Assistant

## 2021-07-10 NOTE — Telephone Encounter (Signed)
Returned call to Pt.  Advised that per Dr. Johney Frame-  he does not anticipate being able to do any further afib ablations.  Pt will follow up with Rudi Coco if she has any further breakthrough afib.  Gave names of EP doctor's available at Pt request.  She will follow up with afib clinic.

## 2021-07-15 IMAGING — CR DG CHEST 2V
2 series · 2 of 2 positions shown · non-contrast
Comparison: 02/18/2018.

CLINICAL DATA: Chest pain.

EXAM:
CHEST - 2 VIEW

[chest pa]
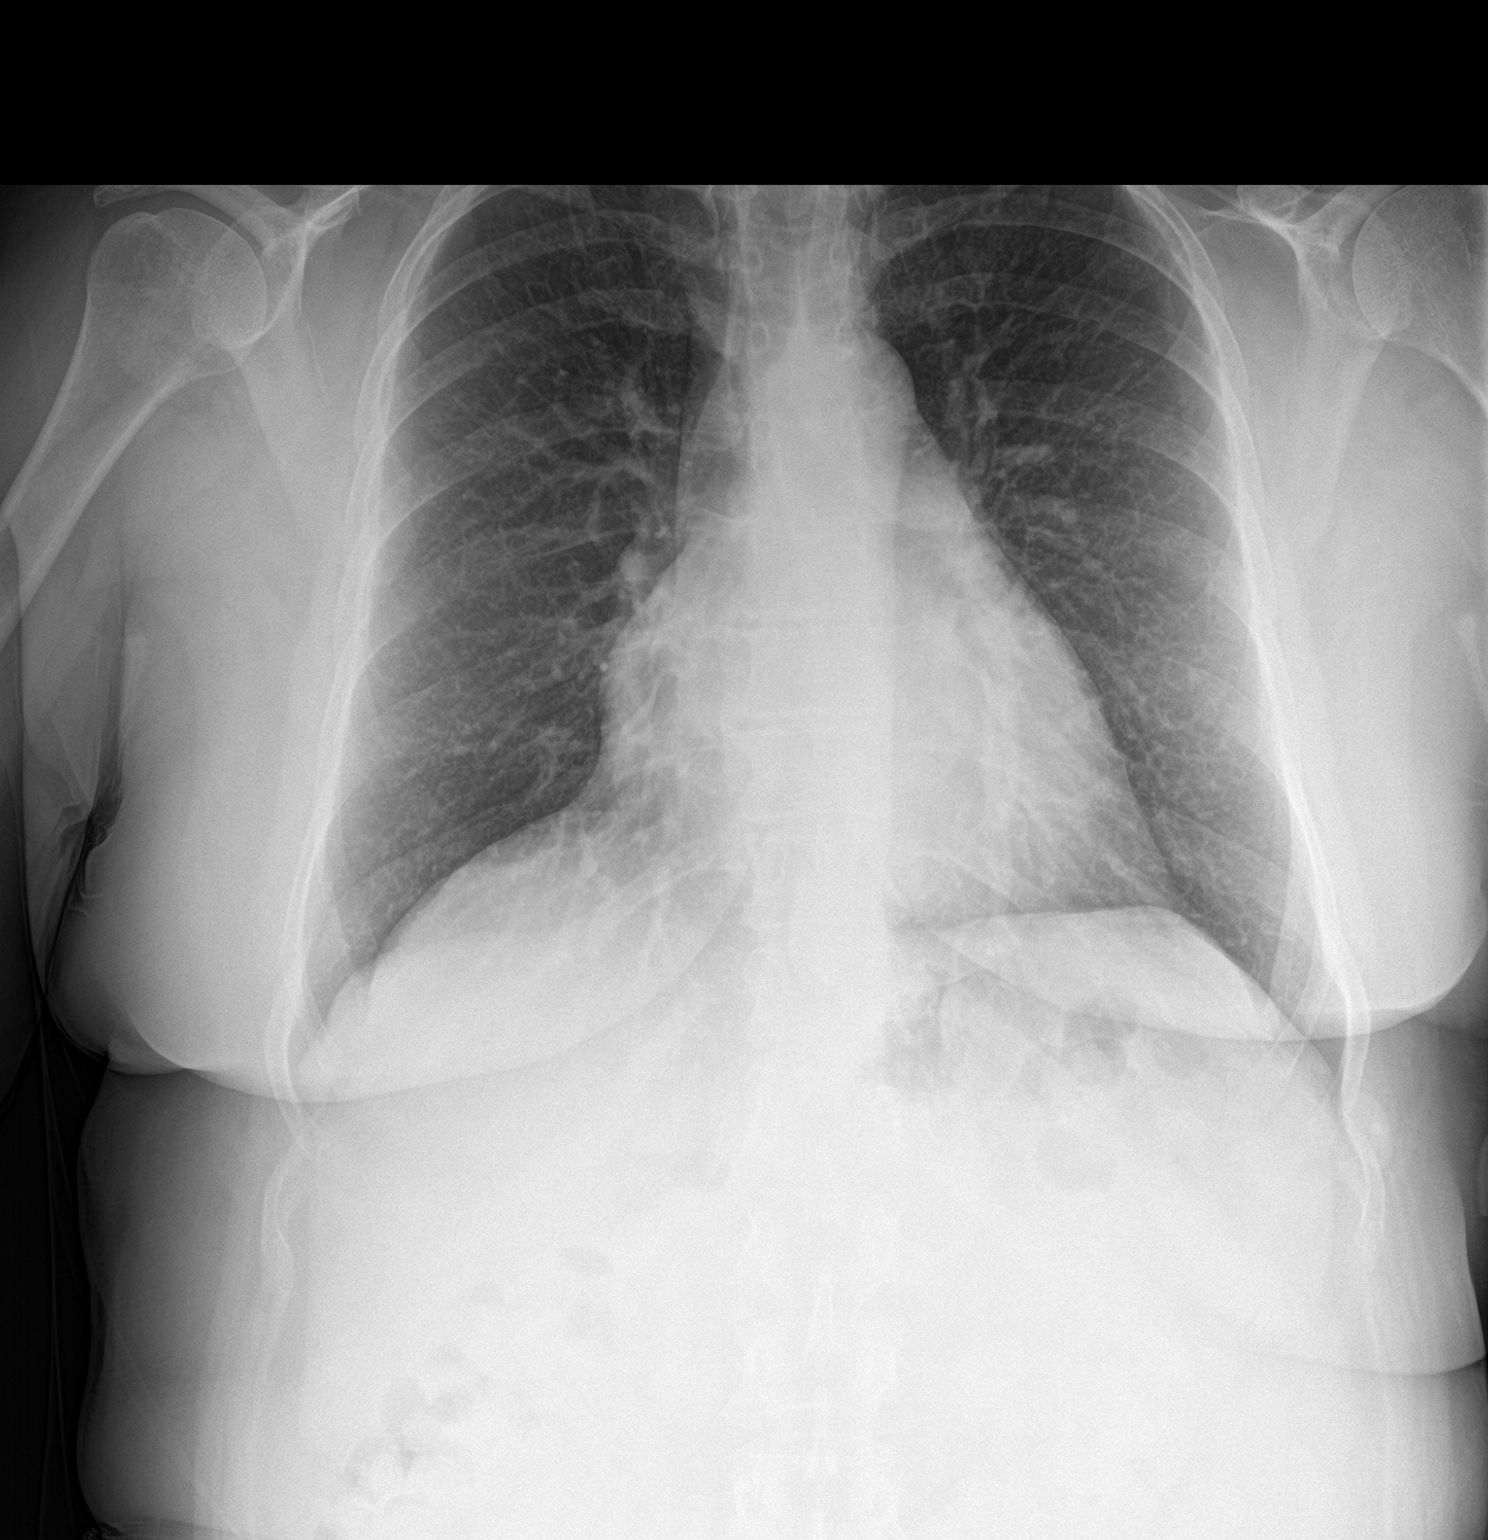

[chest lat]
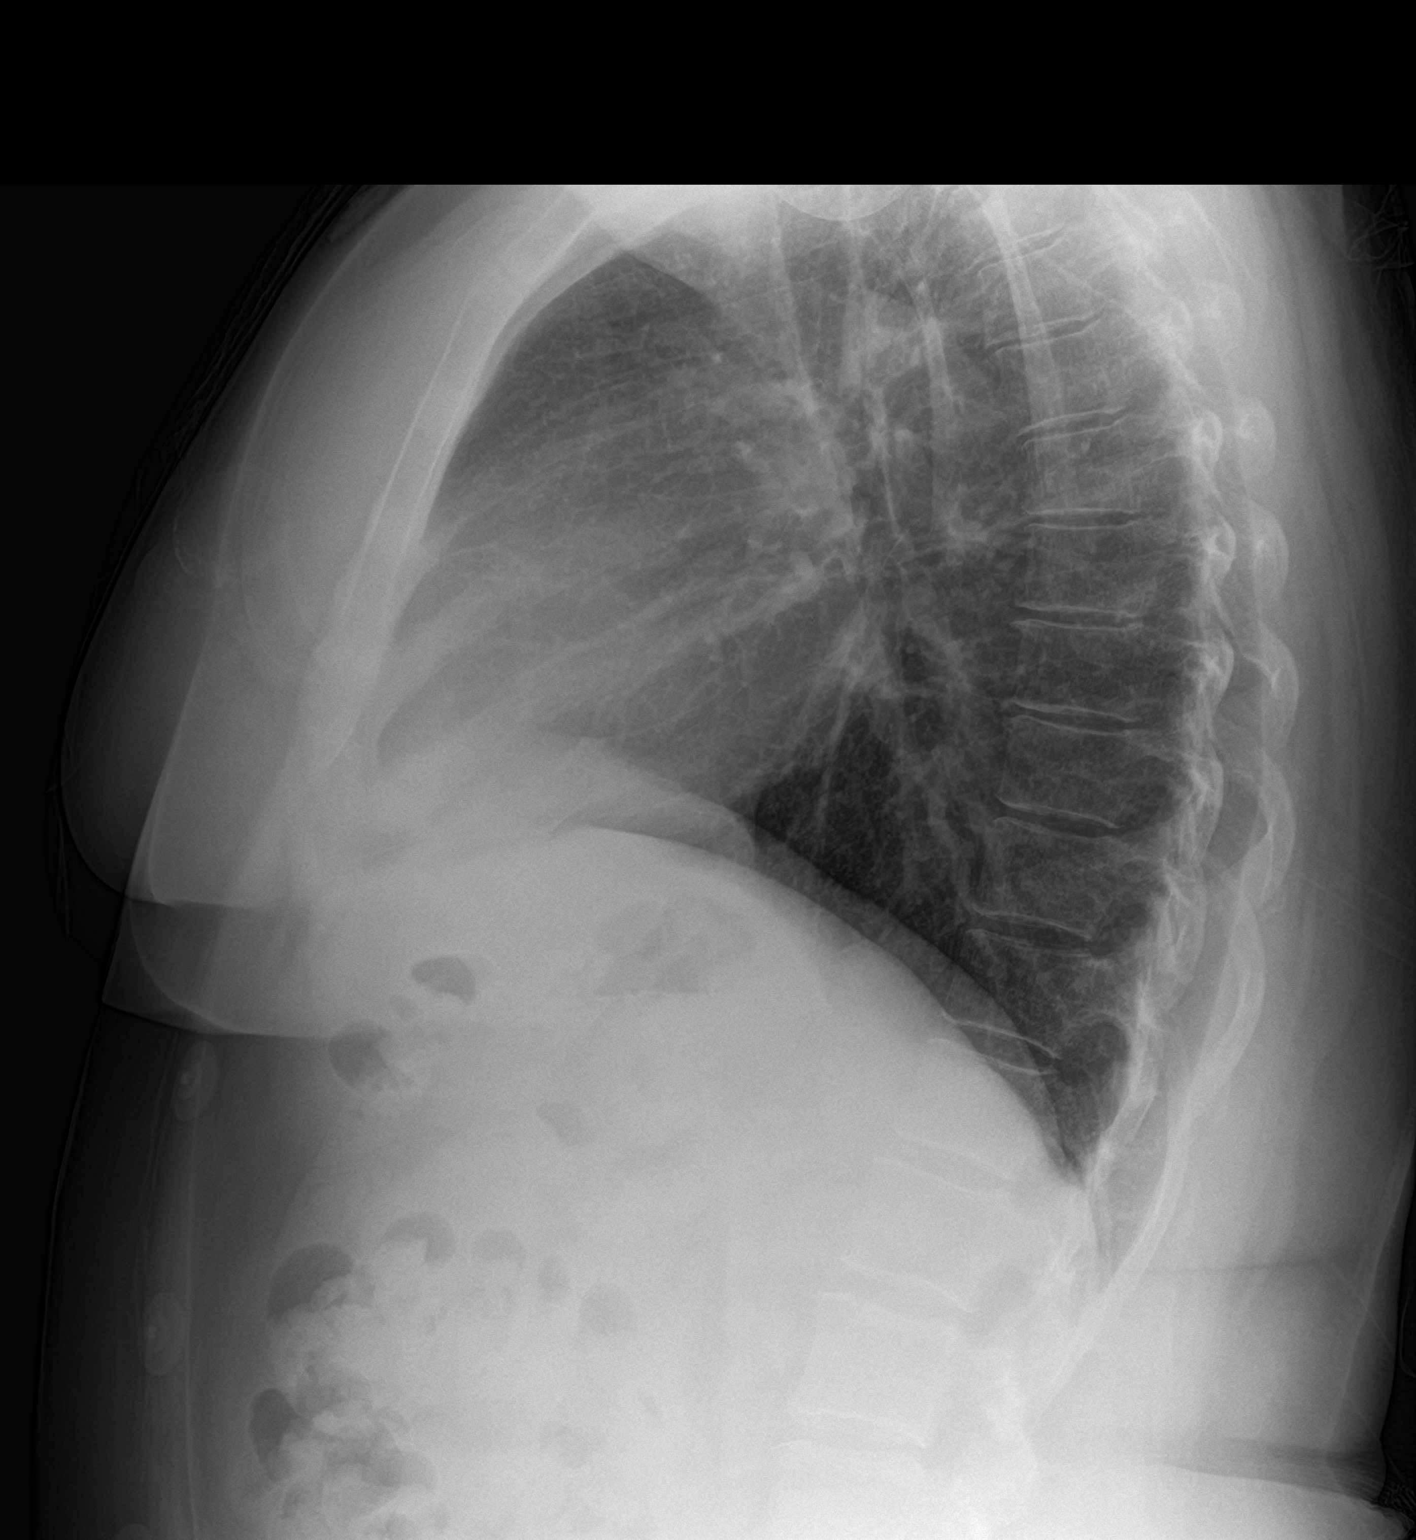

[2 of 2 positions shown; findings below may reference images not displayed]

FINDINGS: Mediastinum and hilar structures normal. Lungs are clear. No pleural
effusion or pneumothorax. Heart size normal. No acute bony
abnormality.
IMPRESSION: No acute cardiopulmonary disease.

## 2021-07-24 NOTE — Telephone Encounter (Signed)
Pt will continue to monitor for symptoms and call if she would like to consider afib ablation with EP

## 2021-08-31 DIAGNOSIS — E538 Deficiency of other specified B group vitamins: Secondary | ICD-10-CM | POA: Diagnosis not present

## 2021-09-18 DIAGNOSIS — M9901 Segmental and somatic dysfunction of cervical region: Secondary | ICD-10-CM | POA: Diagnosis not present

## 2021-09-18 DIAGNOSIS — R519 Headache, unspecified: Secondary | ICD-10-CM | POA: Diagnosis not present

## 2021-09-18 DIAGNOSIS — M5412 Radiculopathy, cervical region: Secondary | ICD-10-CM | POA: Diagnosis not present

## 2021-09-18 DIAGNOSIS — M542 Cervicalgia: Secondary | ICD-10-CM | POA: Diagnosis not present

## 2021-09-21 DIAGNOSIS — M5412 Radiculopathy, cervical region: Secondary | ICD-10-CM | POA: Diagnosis not present

## 2021-09-21 DIAGNOSIS — R519 Headache, unspecified: Secondary | ICD-10-CM | POA: Diagnosis not present

## 2021-09-21 DIAGNOSIS — M9901 Segmental and somatic dysfunction of cervical region: Secondary | ICD-10-CM | POA: Diagnosis not present

## 2021-09-21 DIAGNOSIS — M542 Cervicalgia: Secondary | ICD-10-CM | POA: Diagnosis not present

## 2021-09-23 DIAGNOSIS — R519 Headache, unspecified: Secondary | ICD-10-CM | POA: Diagnosis not present

## 2021-09-23 DIAGNOSIS — M542 Cervicalgia: Secondary | ICD-10-CM | POA: Diagnosis not present

## 2021-09-23 DIAGNOSIS — M9901 Segmental and somatic dysfunction of cervical region: Secondary | ICD-10-CM | POA: Diagnosis not present

## 2021-09-23 DIAGNOSIS — M5412 Radiculopathy, cervical region: Secondary | ICD-10-CM | POA: Diagnosis not present

## 2021-09-24 DIAGNOSIS — M542 Cervicalgia: Secondary | ICD-10-CM | POA: Diagnosis not present

## 2021-09-24 DIAGNOSIS — R519 Headache, unspecified: Secondary | ICD-10-CM | POA: Diagnosis not present

## 2021-09-24 DIAGNOSIS — M5412 Radiculopathy, cervical region: Secondary | ICD-10-CM | POA: Diagnosis not present

## 2021-09-24 DIAGNOSIS — M9901 Segmental and somatic dysfunction of cervical region: Secondary | ICD-10-CM | POA: Diagnosis not present

## 2021-09-29 DIAGNOSIS — M5412 Radiculopathy, cervical region: Secondary | ICD-10-CM | POA: Diagnosis not present

## 2021-09-29 DIAGNOSIS — R519 Headache, unspecified: Secondary | ICD-10-CM | POA: Diagnosis not present

## 2021-09-29 DIAGNOSIS — M9901 Segmental and somatic dysfunction of cervical region: Secondary | ICD-10-CM | POA: Diagnosis not present

## 2021-09-29 DIAGNOSIS — M542 Cervicalgia: Secondary | ICD-10-CM | POA: Diagnosis not present

## 2021-09-30 DIAGNOSIS — M9901 Segmental and somatic dysfunction of cervical region: Secondary | ICD-10-CM | POA: Diagnosis not present

## 2021-09-30 DIAGNOSIS — M542 Cervicalgia: Secondary | ICD-10-CM | POA: Diagnosis not present

## 2021-09-30 DIAGNOSIS — R519 Headache, unspecified: Secondary | ICD-10-CM | POA: Diagnosis not present

## 2021-09-30 DIAGNOSIS — M5412 Radiculopathy, cervical region: Secondary | ICD-10-CM | POA: Diagnosis not present

## 2021-10-05 DIAGNOSIS — R519 Headache, unspecified: Secondary | ICD-10-CM | POA: Diagnosis not present

## 2021-10-05 DIAGNOSIS — M5412 Radiculopathy, cervical region: Secondary | ICD-10-CM | POA: Diagnosis not present

## 2021-10-05 DIAGNOSIS — K219 Gastro-esophageal reflux disease without esophagitis: Secondary | ICD-10-CM | POA: Diagnosis not present

## 2021-10-05 DIAGNOSIS — Z8249 Family history of ischemic heart disease and other diseases of the circulatory system: Secondary | ICD-10-CM | POA: Diagnosis not present

## 2021-10-05 DIAGNOSIS — R69 Illness, unspecified: Secondary | ICD-10-CM | POA: Diagnosis not present

## 2021-10-05 DIAGNOSIS — M542 Cervicalgia: Secondary | ICD-10-CM | POA: Diagnosis not present

## 2021-10-05 DIAGNOSIS — M9901 Segmental and somatic dysfunction of cervical region: Secondary | ICD-10-CM | POA: Diagnosis not present

## 2021-10-07 DIAGNOSIS — M542 Cervicalgia: Secondary | ICD-10-CM | POA: Diagnosis not present

## 2021-10-07 DIAGNOSIS — M5412 Radiculopathy, cervical region: Secondary | ICD-10-CM | POA: Diagnosis not present

## 2021-10-07 DIAGNOSIS — M9901 Segmental and somatic dysfunction of cervical region: Secondary | ICD-10-CM | POA: Diagnosis not present

## 2021-10-07 DIAGNOSIS — R519 Headache, unspecified: Secondary | ICD-10-CM | POA: Diagnosis not present

## 2021-10-09 DIAGNOSIS — M9901 Segmental and somatic dysfunction of cervical region: Secondary | ICD-10-CM | POA: Diagnosis not present

## 2021-10-09 DIAGNOSIS — M5412 Radiculopathy, cervical region: Secondary | ICD-10-CM | POA: Diagnosis not present

## 2021-10-09 DIAGNOSIS — R519 Headache, unspecified: Secondary | ICD-10-CM | POA: Diagnosis not present

## 2021-10-09 DIAGNOSIS — M542 Cervicalgia: Secondary | ICD-10-CM | POA: Diagnosis not present

## 2021-10-12 DIAGNOSIS — R519 Headache, unspecified: Secondary | ICD-10-CM | POA: Diagnosis not present

## 2021-10-12 DIAGNOSIS — M542 Cervicalgia: Secondary | ICD-10-CM | POA: Diagnosis not present

## 2021-10-12 DIAGNOSIS — M9901 Segmental and somatic dysfunction of cervical region: Secondary | ICD-10-CM | POA: Diagnosis not present

## 2021-10-12 DIAGNOSIS — M5412 Radiculopathy, cervical region: Secondary | ICD-10-CM | POA: Diagnosis not present

## 2021-10-14 ENCOUNTER — Other Ambulatory Visit: Payer: Self-pay

## 2021-10-14 DIAGNOSIS — M5412 Radiculopathy, cervical region: Secondary | ICD-10-CM | POA: Diagnosis not present

## 2021-10-14 DIAGNOSIS — M542 Cervicalgia: Secondary | ICD-10-CM | POA: Diagnosis not present

## 2021-10-14 DIAGNOSIS — R519 Headache, unspecified: Secondary | ICD-10-CM | POA: Diagnosis not present

## 2021-10-14 DIAGNOSIS — M9901 Segmental and somatic dysfunction of cervical region: Secondary | ICD-10-CM | POA: Diagnosis not present

## 2021-10-14 MED ORDER — ATENOLOL 25 MG PO TABS: 12.5000 mg | ORAL_TABLET | Freq: Every day | ORAL | 2 refills | Status: DC

## 2021-10-14 NOTE — Telephone Encounter (Signed)
Pt's medication was sent to pt's pharmacy as requested. Confirmation received.  °

## 2021-10-16 ENCOUNTER — Other Ambulatory Visit: Payer: Self-pay | Admitting: Certified Nurse Midwife

## 2021-10-16 DIAGNOSIS — R519 Headache, unspecified: Secondary | ICD-10-CM | POA: Diagnosis not present

## 2021-10-16 DIAGNOSIS — B9689 Other specified bacterial agents as the cause of diseases classified elsewhere: Secondary | ICD-10-CM | POA: Diagnosis not present

## 2021-10-16 DIAGNOSIS — N760 Acute vaginitis: Secondary | ICD-10-CM | POA: Diagnosis not present

## 2021-10-16 DIAGNOSIS — M542 Cervicalgia: Secondary | ICD-10-CM | POA: Diagnosis not present

## 2021-10-16 DIAGNOSIS — Z01419 Encounter for gynecological examination (general) (routine) without abnormal findings: Secondary | ICD-10-CM | POA: Diagnosis not present

## 2021-10-16 DIAGNOSIS — N6311 Unspecified lump in the right breast, upper outer quadrant: Secondary | ICD-10-CM

## 2021-10-16 DIAGNOSIS — R8290 Unspecified abnormal findings in urine: Secondary | ICD-10-CM | POA: Diagnosis not present

## 2021-10-16 DIAGNOSIS — M5412 Radiculopathy, cervical region: Secondary | ICD-10-CM | POA: Diagnosis not present

## 2021-10-16 DIAGNOSIS — N898 Other specified noninflammatory disorders of vagina: Secondary | ICD-10-CM | POA: Diagnosis not present

## 2021-10-16 DIAGNOSIS — M9901 Segmental and somatic dysfunction of cervical region: Secondary | ICD-10-CM | POA: Diagnosis not present

## 2021-10-20 DIAGNOSIS — M5412 Radiculopathy, cervical region: Secondary | ICD-10-CM | POA: Diagnosis not present

## 2021-10-20 DIAGNOSIS — R519 Headache, unspecified: Secondary | ICD-10-CM | POA: Diagnosis not present

## 2021-10-20 DIAGNOSIS — M542 Cervicalgia: Secondary | ICD-10-CM | POA: Diagnosis not present

## 2021-10-20 DIAGNOSIS — M9901 Segmental and somatic dysfunction of cervical region: Secondary | ICD-10-CM | POA: Diagnosis not present

## 2021-10-22 DIAGNOSIS — E538 Deficiency of other specified B group vitamins: Secondary | ICD-10-CM | POA: Diagnosis not present

## 2021-11-05 ENCOUNTER — Ambulatory Visit
Admission: RE | Admit: 2021-11-05 | Discharge: 2021-11-05 | Disposition: A | Discharge status: Home / Self Care | Payer: 59 | Source: Ambulatory Visit | Attending: Certified Nurse Midwife | Admitting: Certified Nurse Midwife

## 2021-11-05 DIAGNOSIS — N6001 Solitary cyst of right breast: Secondary | ICD-10-CM | POA: Diagnosis not present

## 2021-11-05 DIAGNOSIS — N6311 Unspecified lump in the right breast, upper outer quadrant: Secondary | ICD-10-CM | POA: Insufficient documentation

## 2021-11-05 DIAGNOSIS — R928 Other abnormal and inconclusive findings on diagnostic imaging of breast: Secondary | ICD-10-CM | POA: Diagnosis not present

## 2021-11-18 ENCOUNTER — Encounter (HOSPITAL_COMMUNITY): Payer: Self-pay | Admitting: Internal Medicine

## 2021-11-18 ENCOUNTER — Other Ambulatory Visit: Payer: Self-pay

## 2021-11-18 ENCOUNTER — Ambulatory Visit (HOSPITAL_COMMUNITY)
Admission: RE | Admit: 2021-11-18 | Discharge: 2021-11-18 | Disposition: A | Discharge status: Home / Self Care | Payer: 59 | Source: Ambulatory Visit | Attending: Nurse Practitioner | Admitting: Nurse Practitioner

## 2021-11-18 ENCOUNTER — Emergency Department (HOSPITAL_COMMUNITY): Payer: 59

## 2021-11-18 ENCOUNTER — Encounter (HOSPITAL_COMMUNITY): Payer: Self-pay | Admitting: Nurse Practitioner

## 2021-11-18 ENCOUNTER — Observation Stay (HOSPITAL_COMMUNITY)
Admission: EM | Admit: 2021-11-18 | Discharge: 2021-11-19 | Disposition: A | Discharge status: Home / Self Care | Payer: 59 | Attending: Internal Medicine | Admitting: Internal Medicine

## 2021-11-18 VITALS — HR 181

## 2021-11-18 DIAGNOSIS — G4733 Obstructive sleep apnea (adult) (pediatric): Secondary | ICD-10-CM | POA: Insufficient documentation

## 2021-11-18 DIAGNOSIS — E669 Obesity, unspecified: Secondary | ICD-10-CM | POA: Diagnosis not present

## 2021-11-18 DIAGNOSIS — F419 Anxiety disorder, unspecified: Secondary | ICD-10-CM | POA: Insufficient documentation

## 2021-11-18 DIAGNOSIS — R69 Illness, unspecified: Secondary | ICD-10-CM | POA: Diagnosis not present

## 2021-11-18 DIAGNOSIS — Z6836 Body mass index (BMI) 36.0-36.9, adult: Secondary | ICD-10-CM | POA: Insufficient documentation

## 2021-11-18 DIAGNOSIS — R0602 Shortness of breath: Secondary | ICD-10-CM | POA: Diagnosis not present

## 2021-11-18 DIAGNOSIS — I4891 Unspecified atrial fibrillation: Secondary | ICD-10-CM | POA: Diagnosis present

## 2021-11-18 DIAGNOSIS — I480 Paroxysmal atrial fibrillation: Secondary | ICD-10-CM

## 2021-11-18 LAB — CBC
HCT: 41.5 % (ref 36.0–46.0)
Hemoglobin: 13.7 g/dL (ref 12.0–15.0)
MCH: 29.7 pg (ref 26.0–34.0)
MCHC: 33 g/dL (ref 30.0–36.0)
MCV: 90 fL (ref 80.0–100.0)
Platelets: 386 10*3/uL (ref 150–400)
RBC: 4.61 MIL/uL (ref 3.87–5.11)
RDW: 12.8 % (ref 11.5–15.5)
WBC: 12 10*3/uL — ABNORMAL HIGH (ref 4.0–10.5)
nRBC: 0 % (ref 0.0–0.2)

## 2021-11-18 LAB — MAGNESIUM: Magnesium: 2 mg/dL (ref 1.7–2.4)

## 2021-11-18 LAB — BASIC METABOLIC PANEL
Anion gap: 13 (ref 5–15)
BUN: 14 mg/dL (ref 6–20)
CO2: 23 mmol/L (ref 22–32)
Calcium: 9.3 mg/dL (ref 8.9–10.3)
Chloride: 104 mmol/L (ref 98–111)
Creatinine, Ser: 0.89 mg/dL (ref 0.44–1.00)
GFR, Estimated: >60 mL/min (ref >60)
Glucose, Bld: 109 mg/dL — ABNORMAL HIGH (ref 70–99)
Potassium: 4.2 mmol/L (ref 3.5–5.1)
Sodium: 140 mmol/L (ref 135–145)

## 2021-11-18 LAB — TSH: TSH: 2.569 u[IU]/mL (ref 0.350–4.500)

## 2021-11-18 LAB — I-STAT BETA HCG BLOOD, ED (MC, WL, AP ONLY): I-stat hCG, quantitative: 14.1 m[IU]/mL — ABNORMAL HIGH (ref <5)

## 2021-11-18 MED ORDER — SODIUM CHLORIDE 0.9 % IV SOLN: Freq: Once | INTRAVENOUS | Status: AC

## 2021-11-18 MED ORDER — LACTATED RINGERS IV BOLUS
500.0000 mL | Freq: Once | INTRAVENOUS | Status: AC
  Administered 2021-11-18: 500 mL via INTRAVENOUS

## 2021-11-18 MED ORDER — LACTATED RINGERS IV SOLN: INTRAVENOUS | Status: DC

## 2021-11-18 MED ORDER — HEPARIN BOLUS VIA INFUSION
4000.0000 [IU] | Freq: Once | INTRAVENOUS | Status: DC
  Filled 2021-11-18: qty 4000

## 2021-11-18 MED ORDER — ACETAMINOPHEN 325 MG PO TABS
650.0000 mg | ORAL_TABLET | ORAL | Status: DC | PRN
  Administered 2021-11-19: 650 mg via ORAL
  Filled 2021-11-18: qty 2

## 2021-11-18 MED ORDER — HEPARIN (PORCINE) 25000 UT/250ML-% IV SOLN: 1200.0000 [IU]/h | INTRAVENOUS | Status: DC

## 2021-11-18 MED ORDER — DILTIAZEM LOAD VIA INFUSION
10.0000 mg | Freq: Once | INTRAVENOUS | Status: AC
  Administered 2021-11-18: 10 mg via INTRAVENOUS
  Filled 2021-11-18: qty 10

## 2021-11-18 MED ORDER — DILTIAZEM HCL-DEXTROSE 125-5 MG/125ML-% IV SOLN (PREMIX)
5.0000 mg/h | INTRAVENOUS | Status: DC
  Administered 2021-11-18: 5 mg/h via INTRAVENOUS
  Administered 2021-11-19 (×2): 15 mg/h via INTRAVENOUS
  Filled 2021-11-18 (×3): qty 125

## 2021-11-18 MED ORDER — METOPROLOL TARTRATE 12.5 MG HALF TABLET
12.5000 mg | ORAL_TABLET | Freq: Two times a day (BID) | ORAL | Status: DC
  Administered 2021-11-18 – 2021-11-19 (×2): 12.5 mg via ORAL
  Filled 2021-11-18 (×2): qty 1

## 2021-11-18 MED ORDER — ONDANSETRON HCL 4 MG/2ML IJ SOLN: 4.0000 mg | Freq: Four times a day (QID) | INTRAMUSCULAR | Status: DC | PRN

## 2021-11-18 MED ORDER — PANTOPRAZOLE SODIUM 40 MG PO TBEC: 40.0000 mg | DELAYED_RELEASE_TABLET | Freq: Every day | ORAL | Status: DC | PRN

## 2021-11-18 MED ORDER — APIXABAN 5 MG PO TABS
5.0000 mg | ORAL_TABLET | Freq: Two times a day (BID) | ORAL | Status: DC
  Administered 2021-11-18 – 2021-11-19 (×2): 5 mg via ORAL
  Filled 2021-11-18 (×2): qty 1

## 2021-11-18 MED ORDER — ALPRAZOLAM 0.25 MG PO TABS
0.2500 mg | ORAL_TABLET | Freq: Every evening | ORAL | Status: DC | PRN
  Administered 2021-11-19: 0.5 mg via ORAL
  Filled 2021-11-18: qty 2

## 2021-11-18 NOTE — ED Notes (Signed)
Got patient into a gown on the monitor patient is resting with family at beside and nurse

## 2021-11-18 NOTE — Progress Notes (Addendum)
ANTICOAGULATION CONSULT NOTE - Follow Up Consult  Pharmacy Consult for Apixaban Indication: atrial fibrillation  Allergies  Allergen Reactions   Codeine Nausea And Vomiting   Ivp Dye [Iodinated Contrast Media] Itching    Patient Measurements: Height: 5\' 5"  (165.1 cm) Weight: 98.9 kg (218 lb) IBW/kg (Calculated) : 57 Heparin Dosing Weight: 79.5  Vital Signs: Temp: 98.6 F (37 C) (10/25 1437) Temp Source: Oral (10/25 1437) BP: 85/60 (10/25 1545) Pulse Rate: 58 (10/25 1545)  Labs: Recent Labs    11/18/21 1447  HGB 13.7  HCT 41.5  PLT 386  CREATININE 0.89    Estimated Creatinine Clearance: 79.3 mL/min (by C-G formula based on SCr of 0.89 mg/dL).  Assessment: 61yo F presenting for further evaluation for afib with RVR. She is not on anticoagulation prior to admission. CBC wnl, no bleeding noted. Pharmacy has been consulted to review initial dosing for Eliquis.   Goal of Therapy:  Therapeutic anticoagulation for prevention of DVT in afib Monitor platelets by anticoagulation protocol: Yes   Plan:  Eliquis 5mg  BID Monitor H&H and s/s of bleeding  Titus Dubin, PharmD PGY1 Pharmacy Resident 11/18/2021 5:03 PM

## 2021-11-18 NOTE — ED Provider Notes (Signed)
[place] Provider Note   CSN: [id number] Arrival date & time: 11/18/21  1420     History  Chief Complaint  Patient presents with   afib RVR    JANILL FUSCO is a 61 y.o. female.  HPI Patient presents for atrial fibrillation.  Medical history includes SBO, anxiety, anemia, obesity, OSA, atrial fibrillation.  She had repeated episodes of atrial fibrillation 2 years ago.  At that time, she was frequently seen in the ED and underwent cardioversion on several occasions.  She underwent an ablation last year.  This provided her with 7 months of no further episodes of atrial fibrillation.  After that 29-month period, she did have recurrent episodes.  Typically these are short-lived and she does not come into the ED.  Over the weekend, she had shortness of breath.  She suspects that she was in and out of atrial fibrillation over the past several days.  She did not experience palpitations, which is different from her prior episodes of atrial fibrillation.  Due to the persistent symptoms, patient went to atrial fibrillation clinic today.  At that point, she was found to have a heart rate of 180.  She was sent to the ED for further evaluation.  Currently, she is not on any anticoagulation and is not on any AV nodal agents.  She states that she has been prescribed an AV nodal agent to be taken as needed but she never takes it.  Current symptoms include ongoing shortness of breath.  She denies any recent chest pains.  She does not experience palpitations with this episode of atrial fibrillation.  She denies any nausea.  She has had recent URI symptoms which have improved.  She has felt lightheaded and dizziness today which also prompted her to go to the atrial fibrillation clinic.      Home Medications Prior to Admission medications   Medication Sig Start Date End Date Taking? Authorizing Provider  acetaminophen (TYLENOL) 500 MG tablet Take 500 mg by mouth as  needed for mild pain, moderate pain or headache.   Yes [provider]  ALPRAZolam Duanne Moron) 0.5 MG tablet Take 0.5-1 tablets (0.25-0.5 mg total) by mouth at bedtime as needed for anxiety. 08/15/18  Yes Dohmeier, Asencion Partridge, MD  atenolol (TENORMIN) 25 MG tablet Take 0.5 tablets (12.5 mg total) by mouth daily. Patient taking differently: Take 12.5 mg by mouth every evening. 10/14/21  Yes Allred, Jeneen Rinks, MD  MAGNESIUM PO Take 400 mg by mouth every evening.   Yes [provider]  Multiple Vitamins-Minerals (MULTIVITAMIN WITH MINERALS) tablet Take 2 tablets by mouth daily.   Yes [provider]  pantoprazole (PROTONIX) 40 MG tablet Take 40 mg by mouth daily as needed (Fro stomach acid). 05/13/21  Yes [provider]      Allergies    Codeine and Ivp dye [iodinated contrast media]    Review of Systems   Review of Systems  Respiratory:  Positive for shortness of breath.   Neurological:  Positive for dizziness and light-headedness.  All other systems reviewed and are negative.   Physical Exam Updated Vital Signs BP (!) 85/60   Pulse (!) 58   Temp 98.6 F (37 C) (Oral)   Resp 19   Ht 5\' 5"  (1.651 m)   Wt 98.9 kg   LMP 08/09/2011 (Approximate) Comment: PT STILL SPOTS AS OF 08-08-17  SpO2 97%   BMI 36.28 kg/m  Physical Exam Vitals and nursing note reviewed.  Constitutional:  General: She is not in acute distress.    Appearance: Normal appearance. She is well-developed. She is not ill-appearing, toxic-appearing or diaphoretic.  HENT:     Head: Normocephalic and atraumatic.     Right Ear: External ear normal.     Left Ear: External ear normal.     Nose: Nose normal.     Mouth/Throat:     Mouth: Mucous membranes are moist.     Pharynx: Oropharynx is clear.  Eyes:     Extraocular Movements: Extraocular movements intact.     Conjunctiva/sclera: Conjunctivae normal.  Cardiovascular:     Rate and Rhythm: Tachycardia present. Rhythm irregular.     Heart  sounds: No murmur heard. Pulmonary:     Effort: Pulmonary effort is normal. No respiratory distress.     Breath sounds: Normal breath sounds. No wheezing or rales.  Chest:     Chest wall: No tenderness.  Abdominal:     General: There is no distension.     Palpations: Abdomen is soft.     Tenderness: There is no abdominal tenderness.  Musculoskeletal:        General: No swelling. Normal range of motion.     Cervical back: Normal range of motion and neck supple.     Right lower leg: No edema.     Left lower leg: No edema.  Skin:    General: Skin is warm and dry.     Coloration: Skin is not jaundiced or pale.  Neurological:     General: No focal deficit present.     Mental Status: She is alert and oriented to person, place, and time.     Cranial Nerves: No cranial nerve deficit.     Sensory: No sensory deficit.     Motor: No weakness.     Coordination: Coordination normal.  Psychiatric:        Mood and Affect: Mood normal.        Behavior: Behavior normal.        Thought Content: Thought content normal.        Judgment: Judgment normal.     ED Results / Procedures / Treatments   Labs (all labs ordered are listed, but only abnormal results are displayed) Labs Reviewed  BASIC METABOLIC PANEL - Abnormal; Notable for the following components:      Result Value   Glucose, Bld 109 (*)    All other components within normal limits  CBC - Abnormal; Notable for the following components:   WBC 12.0 (*)    All other components within normal limits  I-STAT BETA HCG BLOOD, ED ([facility], [facility], [facility] ONLY) - Abnormal; Notable for the following components:   I-stat hCG, quantitative 14.1 (*)    All other components within normal limits  MAGNESIUM  TSH  HEPARIN LEVEL (UNFRACTIONATED)  CBC    EKG None  Radiology DG Chest Port 1 View  Result Date: 11/18/2021 CLINICAL DATA:  Shortness of breath, atrial fibrillation with rapid ventricular response EXAM: PORTABLE CHEST 1 VIEW COMPARISON:   Portable exam 1503 hours compared to 02/03/2020 FINDINGS: Borderline enlargement of cardiac silhouette. Mediastinal contours and pulmonary vascularity normal. Lungs clear. No infiltrate, pleural effusion, pneumothorax or acute osseous findings. IMPRESSION: No acute abnormalities. Electronically Signed   By: Lavonia Dana M.D.   On: 11/18/2021 15:12    Procedures Procedures    Medications Ordered in ED Medications  diltiazem (CARDIZEM) 1 mg/mL load via infusion 10 mg (10 mg Intravenous Bolus from Bag 11/18/21 1504)  And  diltiazem (CARDIZEM) 125 mg in dextrose 5% 125 mL (1 mg/mL) infusion (7.5 mg/hr Intravenous Rate/Dose Change 11/18/21 1556)  lactated ringers bolus 500 mL (has no administration in time range)  heparin ADULT infusion 100 units/mL (25000 units/266mL) (has no administration in time range)  heparin bolus via infusion 4,000 Units (has no administration in time range)  0.9 %  sodium chloride infusion ( Intravenous New Bag/Given 11/18/21 1501)    ED Course/ Medical Decision Making/ A&P                           Medical Decision Making Amount and/or Complexity of Data Reviewed Labs: ordered. Radiology: ordered.  Risk Prescription drug management.   This patient presents to the ED for concern of shortness of breath and tachycardia, this involves an extensive number of treatment options, and is a complaint that carries with it a high risk of complications and morbidity.  The differential diagnosis includes fibrillation with RVR, SVT, electrolyte abnormalities, dehydration, volume overload, infection, anemia   Co morbidities that complicate the patient evaluation  SBO, anxiety, anemia, obesity, OSA, atrial fibrillation   Additional history obtained:  Additional history obtained from N/A External records from outside source obtained and reviewed including EMR   Lab Tests:  I Ordered, and personally interpreted labs.  The pertinent results include: Electrolytes are  normal.  Hemoglobin is normal.  Patient has mild leukocytosis.   Imaging Studies ordered:  I ordered imaging studies including chest x-ray I independently visualized and interpreted imaging which showed no acute findings I agree with the radiologist interpretation   Cardiac Monitoring: / EKG:  The patient was maintained on a cardiac monitor.  I personally viewed and interpreted the cardiac monitored which showed an underlying rhythm of: Atrial fibrillation with RVR   Consultations Obtained:  I requested consultation with the cardiology,  and discussed lab and imaging findings as well as pertinent plan - they recommend: They will see patient in ED with recommendations to follow   Problem List / ED Course / Critical interventions / Medication management  Patient is a pleasant 61 year old female who presents for atrial fibrillation.  This was diagnosed at atrial fibrillation clinic today.  She has been experiencing shortness of breath and near syncopal symptoms and this prompted her to go to atrial fibrillation clinic.  She has a history of A-fib and did have frequent episodes 2 years ago.  These episodes stopped following her ablation last year but have restarted this year.  Typically they are short lasting, however, patient feels that she has been in and out of atrial fibrillation over the weekend.  She is not on anticoagulation and is not on any AV nodal agents.  On exam, she is well-appearing.  Initial vital signs are heart rate ranging from 160s to 180s.  She is normotensive at this time.  Her breathing is unlabored.  Laboratory work-up was initiated to assess for possible electrolyte abnormalities.  Given duration of her current episode which is likely to have been intermittent over the last 3 to 4 days, will treat medically with Cardizem.  Patient was given bolus and gtt. of Cardizem.  On reassessment, patient remains in atrial fibrillation with RVR.  Heart rate is in the 160s.  She has not  had any improvement in her symptoms.  She remains well-appearing.  Heparin was initiated.  Cardiology was consulted.  Cardiology states that they will evaluate in the ED.  Care of patient was  signed out to oncoming ED provider. I ordered medication including IV fluids for hydration; diltiazem for rate control; heparin for atrial fibrillation with RVR Reevaluation of the patient after these medicines showed that the patient stayed the same I have reviewed the patients home medicines and have made adjustments as needed   Social Determinants of Health:  Has access to outpatient care  CRITICAL CARE Performed by: Godfrey Pick   Total critical care time: 34 minutes  Critical care time was exclusive of separately billable procedures and treating other patients.  Critical care was necessary to treat or prevent imminent or life-threatening deterioration.  Critical care was time spent personally by me on the following activities: development of treatment plan with patient and/or surrogate as well as nursing, discussions with consultants, evaluation of patient's response to treatment, examination of patient, obtaining history from patient or surrogate, ordering and performing treatments and interventions, ordering and review of laboratory studies, ordering and review of radiographic studies, pulse oximetry and re-evaluation of patient's condition.        Final Clinical Impression(s) / ED Diagnoses Final diagnoses:  Atrial fibrillation with RVR [facility])    Rx / DC Orders ED Discharge Orders     None         Godfrey Pick, MD 11/18/21 1711

## 2021-11-18 NOTE — ED Notes (Signed)
Cardizem rate just increased  no change in heart rate

## 2021-11-18 NOTE — ED Notes (Signed)
The pt nurse on the floor went home at 1900  waiting for another nurse to give report

## 2021-11-18 NOTE — Plan of Care (Signed)

## 2021-11-18 NOTE — ED Triage Notes (Signed)
Pt sent afib clinic for further evaluation of afib RVR, rate 180s; symptoms started Friday, having palpitations and sob with exertion; had ablation march of last year; denies pain

## 2021-11-18 NOTE — Progress Notes (Addendum)
   11/18/21 2020  Assess: MEWS Score  Temp 98.7 F (37.1 C)  BP 118/73  MAP (mmHg) 88  Pulse Rate (!) 137  ECG Heart Rate (!) 137  Resp 18  SpO2 97 %  O2 Device Room Air  Assess: MEWS Score  MEWS Temp 0  MEWS Systolic 0  MEWS Pulse 3  MEWS RR 0  MEWS LOC 0  MEWS Score 3  MEWS Score Color Yellow  Assess: if the MEWS score is Yellow or Red  Were vital signs taken at a resting state? Yes  Focused Assessment No change from prior assessment  Does the patient meet 2 or more of the SIRS criteria? No  MEWS guidelines implemented *See Row Information* Yes  Treat  MEWS Interventions Administered scheduled meds/treatments (titrated cardizem up)  Pain Scale 0-10  Pain Score 0  Take Vital Signs  Increase Vital Sign Frequency  Yellow: Q 2hr X 2 then Q 4hr X 2, if remains yellow, continue Q 4hrs  Escalate  MEWS: Escalate Yellow: discuss with charge nurse/RN and consider discussing with provider and RRT  Notify: Charge Nurse/RN  Name of Charge Nurse/RN Notified Teola Bradley RN  Date Charge Nurse/RN Notified 11/18/21  Time Charge Nurse/RN Notified 2026  Assess: SIRS CRITERIA  SIRS Temperature  0  SIRS Pulse 1  SIRS Respirations  0  SIRS WBC 1  SIRS Score Sum  2   Received pt from ED. VS as above, HR fluctuated between 130s-180s. Titrated cardizem gtt to max rate. Yellow MEWS protocol started. Pt AOx4, only c/o some SOB w/ exertion. CHG bath given. Tele applied and verified x2. Oriented to room and call light.

## 2021-11-18 NOTE — H&P (Signed)
Cardiology Admission History and Physical   Patient ID: Tracey Lin MRN: [id number]; DOB: Jun 10, 1960   Admission date: 11/18/2021  PCP:  Marinda Elk, MD   [facility] Prov[facility] Cardiologist:  Thompson Grayer, MD     Chief Complaint:  Atrial Fibrillation   Patient Profile:   Tracey Lin is a 61 y.o. female with history of paroxysmal atrial fibrillation, anxiety, OSA who is being seen 11/18/2021 for the evaluation of atrial fibrillation.  History of Present Illness:   Tracey Lin is a 61 year old female with above medical history who is followed by Dr. [name]. Per chart review, patient has a long history of atrial fibrillation (approx 20 years). However, after first being diagnosed, she went multiple years without having known episodes of afib. She did have an episode of atrial fibrillation in 2019 while in [place]. She was admitted to a hospital there, treated with Cardizem IV, and eventually started on multaq, ranexa, atenolol, and eliquis. Noted that she had converted to NSR prior to being discharged.   She established care with Dr. [name] in 07/2017, and has been followed by the afib clinic.  Patient does not like to take medicines, and she is not on AC long term.  Usually, she is able to tell when she goes into A-fib and she is able to get a cardioversion in the ER within 48 hours.  She has a CHA2DS2-VASc of 1, she is willing to take anticoagulation for 4 weeks after cardioversions, but then stops.    Patient recently underwent an atrial fibrillation ablation on 03/25/2020.  Believes that she had been maintaining rhythm since the ablation.  She did complete her course of anticoagulation after the ablation, but has not been taking it since.  Patient felt like she developed A-fib over the weekend after she had URI symptoms.  She is able to feel fluttering in her chest, often feels syncopal.  Patient tried to go to the A-fib clinic which confirmed that she  was in atrial fibrillation with a heart rate of 181 bpm.  She was taken to the ER for further evaluation.  As patient has likely been in A-fib for greater than 48 hours, we were unable to do a cardioversion while in the ER.  She was started on IV diltiazem and cardiology was asked to consult     Past Medical History:  Diagnosis Date   Anxiety    B12 deficiency    Difficult intubation    "SMALL TRACHEA"   GERD (gastroesophageal reflux disease)    History of kidney stones    H/O   OSA (obstructive sleep apnea)    USES CPAP ( rarely)   Paroxysmal atrial fibrillation ([place])     Past Surgical History:  Procedure Laterality Date   ATRIAL FIBRILLATION ABLATION N/A 03/25/2020   Procedure: ATRIAL FIBRILLATION ABLATION;  Surgeon: Thompson Grayer, MD;  Location: [facility];  Service: Cardiovascular;  Laterality: N/A;   AUGMENTATION MAMMAPLASTY  2003   BREAST BIOPSY Right 2005   benign   COLONOSCOPY     DILATATION & CURETTAGE/HYSTEROSCOPY WITH MYOSURE N/A 01/02/2018   Procedure: [place], polypectomy;  Surgeon: Benjaman Kindler, MD;  Location: [facility];  Service: Gynecology;  Laterality: N/A;   ESOPHAGOGASTRODUODENOSCOPY     KNEE ARTHROSCOPY Left 12/02/2017   Procedure: ARTHROSCOPY KNEE WITH PARTIAL MEDIAL MENISECTOMY, REMOVAL OF LOOSE BODY;  Surgeon: Leim Fabry, MD;  Location: [facility];  Service: Orthopedics;  Laterality: Left;  REDUCTION MAMMAPLASTY  2007   TUMMY TUCK  2006     Medications Prior to Admission: Prior to Admission medications   Medication Sig Start Date End Date Taking? Authorizing Provider  acetaminophen (TYLENOL) 500 MG tablet Take 500 mg by mouth as needed for mild pain, moderate pain or headache.   Yes [provider]  ALPRAZolam Duanne Moron) 0.5 MG tablet Take 0.5-1 tablets (0.25-0.5 mg total) by mouth at bedtime as needed for anxiety. 08/15/18  Yes Dohmeier, Asencion Partridge, MD  atenolol (TENORMIN) 25 MG tablet Take 0.5 tablets  (12.5 mg total) by mouth daily. Patient taking differently: Take 12.5 mg by mouth every evening. 10/14/21  Yes Allred, Jeneen Rinks, MD  MAGNESIUM PO Take 400 mg by mouth every evening.   Yes [provider]  Multiple Vitamins-Minerals (MULTIVITAMIN WITH MINERALS) tablet Take 2 tablets by mouth daily.   Yes [provider]  pantoprazole (PROTONIX) 40 MG tablet Take 40 mg by mouth daily as needed (Fro stomach acid). 05/13/21  Yes [provider]     Allergies:    Allergies  Allergen Reactions   Codeine Nausea And Vomiting   Ivp Dye [Iodinated Contrast Media] Itching    Social History:   Social History   Socioeconomic History   Marital status: Divorced    Spouse name: Not on file   Number of children: Not on file   Years of education: Not on file   Highest education level: Not on file  Occupational History   Occupation: realtor  Tobacco Use   Smoking status: Never   Smokeless tobacco: Never  Vaping Use   Vaping Use: Never used  Substance and Sexual Activity   Alcohol use: Yes    Alcohol/week: 1.0 standard drink of alcohol    Types: 1 Glasses of wine per week   Drug use: Never   Sexual activity: Not on file  Other Topics Concern   Not on file  Social History Narrative   Lives in [place]    Works as a Producer, 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   Social Determinants of Health   Financial Resource Strain: Not on file  Food Insecurity: Not on file  Transportation Needs: Not on file  Physical Activity: Not on file  Stress: Not on file  Social Connections: Not on file  Intimate Partner Violence: Not on file    Family History:   The patient's family history includes CAD in her mother; Heart disease in her mother; Pancreatic cancer in her father.    ROS:  Please see the history of present illness.  All other ROS reviewed and negative.     Physical Exam/Data:   Vitals:   11/18/21 1500 11/18/21 1515 11/18/21 1545 11/18/21 1600  BP:   112/86 (!) 85/60   Pulse: 91 (!) 53 (!) 58   Resp: 16 11 19    Temp:      TempSrc:      SpO2: 96% 99% 97%   Weight:    98.9 kg  Height:    5\' 5"  (1.651 m)   No intake or output data in the 24 hours ending 11/18/21 1701    11/18/2021    4:00 PM 06/04/2021    2:20 PM 04/01/2021    1:34 PM  Last 3 Weights  Weight (lbs) 218 lb 216 lb 217 lb 6.4 oz  Weight (kg) 98.884 kg 97.977 kg 98.612 kg     Body mass index is 36.28 kg/m.   Physical exam per MD, see attestation  EKG:  The ECG that was done  was personally reviewed and demonstrates atrial fibrillation, HR 171 BPM  Relevant CV Studies:   Laboratory Data:  High Sensitivity Troponin:  No results for input(s): "TROPONINIHS" in the last 720 hours.    Chemistry Recent Labs  Lab 11/18/21 1447  NA 140  K 4.2  CL 104  CO2 23  GLUCOSE 109*  BUN 14  CREATININE 0.89  CALCIUM 9.3  MG 2.0  GFRNONAA >60  ANIONGAP 13    No results for input(s): "PROT", "ALBUMIN", "AST", "ALT", "ALKPHOS", "BILITOT" in the last 168 hours. Lipids No results for input(s): "CHOL", "TRIG", "HDL", "LABVLDL", "LDLCALC", "CHOLHDL" in the last 168 hours. Hematology Recent Labs  Lab 11/18/21 1447  WBC 12.0*  RBC 4.61  HGB 13.7  HCT 41.5  MCV 90.0  MCH 29.7  MCHC 33.0  RDW 12.8  PLT 386   Thyroid  Recent Labs  Lab 11/18/21 1447  TSH 2.569   BNPNo results for input(s): "BNP", "PROBNP" in the last 168 hours.  DDimer No results for input(s): "DDIMER" in the last 168 hours.   Radiology/Studies:  DG Chest Port 1 View  Result Date: 11/18/2021 CLINICAL DATA:  Shortness of breath, atrial fibrillation with rapid ventricular response EXAM: PORTABLE CHEST 1 VIEW COMPARISON:  Portable exam 1503 hours compared to 02/03/2020 FINDINGS: Borderline enlargement of cardiac silhouette. Mediastinal contours and pulmonary vascularity normal. Lungs clear. No infiltrate, pleural effusion, pneumothorax or acute osseous findings. IMPRESSION: No acute abnormalities.  Electronically Signed   By: Lavonia Dana M.D.   On: 11/18/2021 15:12     Assessment and Plan:   Paroxysmal Atrial Fibrillation, with RVR  - Has a long history of A-fib (roughly 20 years).  Atrial fibrillation was previously well controlled and patient did not have many episodes until 2019.  At that time she established care with A-fib clinic, Dr. [name] - Patient does not like to take medications.  As her CHA2DS2-VASc is 1, she has been able to avoid being on long-term anticoagulation.  -Patient had an atrial fibrillation ablation in 03/2021.  She maintained normal sinus rhythm for approximately 7 months -Now, she is presenting complaining of A-fib that began on Friday 10/20.  As her symptoms have been going on for greater than 48 hours and she is not on a blood thinner, we are unable to do a cardioversion in the ER -Patient on IV diltiazem. BP tolerating - Start metoprolol 12.5 mg twice daily - Start Eliquis 5 mg twice daily.  Patient is willing to take blood thinners after cardioversion.  Discussed that it might be best for her to be on DOAC long-term, patient is agreeable - Ordered echocardiogram (has not had echo since 01/2020) -Tentatively planning TEE guided cardioversion on Friday.  Currently, there are no slots available.  However we will keep an eye on the schedule and arrange - K 4.2, mag 2.0. TSH within normal limits    Risk Assessment/Risk Scores:      CHA2DS2-VASc Score = 1  This indicates a 0.6% annual risk of stroke. The patient's score is based upon: CHF History: 0 HTN History: 0 Diabetes History: 0 Stroke History: 0 Vascular Disease History: 0 Age Score: 0 Gender Score: 1       Severity of Illness: The appropriate patient status for this patient is INPATIENT. Inpatient status is judged to be reasonable and necessary in order to provide the required intensity of service to ensure the patient's safety. The patient's presenting symptoms, physical exam  findings, and  initial radiographic and laboratory data in the context of their chronic comorbidities is felt to place them at high risk for further clinical deterioration. Furthermore, it is not anticipated that the patient will be medically stable for discharge from the hospital within 2 midnights of admission.   * I certify that at the point of admission it is my clinical judgment that the patient will require inpatient hospital care spanning beyond 2 midnights from the point of admission due to high intensity of service, high risk for further deterioration and high frequency of surveillance required.*   For questions or updates, please contact [place] Please consult [email or web address] for contact info under     Signed, Margie Billet, PA-C  11/18/2021 5:01 PM

## 2021-11-18 NOTE — Progress Notes (Signed)
Pt in for developing afib over the weekend after developing an URI. States covid negative at home. She has felt syncopal many times with return of afib. She deferred going to the ER and asked to be seen today.States  she does not have a means of measuring heart rates at home. EKG shows afib at 181 bpm with a palpable BP at 90. She feels syncopal. Charge nurse called and escorted  to the ED via w/c.

## 2021-11-19 ENCOUNTER — Other Ambulatory Visit (HOSPITAL_COMMUNITY): Payer: 59

## 2021-11-19 ENCOUNTER — Other Ambulatory Visit (HOSPITAL_COMMUNITY): Payer: Self-pay

## 2021-11-19 ENCOUNTER — Telehealth (HOSPITAL_COMMUNITY): Payer: Self-pay | Admitting: Pharmacy Technician

## 2021-11-19 DIAGNOSIS — I4891 Unspecified atrial fibrillation: Secondary | ICD-10-CM | POA: Diagnosis not present

## 2021-11-19 LAB — LIPID PANEL
Cholesterol: 172 mg/dL (ref 0–200)
HDL: 38 mg/dL — ABNORMAL LOW (ref >40)
LDL Cholesterol: 115 mg/dL — ABNORMAL HIGH (ref 0–99)
Total CHOL/HDL Ratio: 4.5 RATIO
Triglycerides: 94 mg/dL (ref <150)
VLDL: 19 mg/dL (ref 0–40)

## 2021-11-19 LAB — BASIC METABOLIC PANEL
Anion gap: 7 (ref 5–15)
BUN: 15 mg/dL (ref 6–20)
CO2: 26 mmol/L (ref 22–32)
Calcium: 8.5 mg/dL — ABNORMAL LOW (ref 8.9–10.3)
Chloride: 104 mmol/L (ref 98–111)
Creatinine, Ser: 0.83 mg/dL (ref 0.44–1.00)
GFR, Estimated: >60 mL/min (ref >60)
Glucose, Bld: 103 mg/dL — ABNORMAL HIGH (ref 70–99)
Potassium: 3.7 mmol/L (ref 3.5–5.1)
Sodium: 137 mmol/L (ref 135–145)

## 2021-11-19 LAB — HCG, QUANTITATIVE, PREGNANCY: hCG, Beta Chain, Quant, S: 7 m[IU]/mL — ABNORMAL HIGH (ref <5)

## 2021-11-19 MED ORDER — APIXABAN 5 MG PO TABS
5.0000 mg | ORAL_TABLET | Freq: Two times a day (BID) | ORAL | 2 refills | Status: DC
  Filled 2021-11-19: qty 60, 30d supply, fill #0

## 2021-11-19 MED ORDER — METOPROLOL TARTRATE 25 MG PO TABS
12.5000 mg | ORAL_TABLET | Freq: Two times a day (BID) | ORAL | 2 refills | Status: DC
  Filled 2021-11-19: qty 30, 30d supply, fill #0

## 2021-11-19 NOTE — TOC Benefit Eligibility Note (Signed)
Patient Teacher, English as a foreign language completed.    The patient is currently admitted and upon discharge could be taking Eliquis 5 mg.  The current 30 day co-pay is $525.58 due to $8,018.83 deductible remaining.   The patient is insured through [place], [place] Patient [place] Patient Advocate Team Direct Number: [phone]  Fax: [phone]

## 2021-11-19 NOTE — Telephone Encounter (Signed)
Pharmacy Patient Advocate Encounter  Insurance verification completed.    The patient is insured through [employer]   The patient is currently admitted and ran test claims for the following: Eliquis.  Copays and coinsurance results were relayed to Inpatient clinical team.      

## 2021-11-19 NOTE — Discharge Instructions (Signed)

## 2021-11-19 NOTE — Discharge Summary (Addendum)
Discharge Summary    Patient ID: Tracey Lin MRN: [id number]; DOB: Nov 14, 1960  Admit date: 11/18/2021 Discharge date: 11/19/2021  PCP:  Marinda Elk, [place] Providers Cardiologist:  Roderic Palau, NP   {    Discharge Diagnoses    Principal Problem:   Atrial fibrillation [facility])    Diagnostic Studies/Procedures   N/A _____________   History of Present Illness     Per admission H&P on 11/18/21:  Ms. Lin is a 61 year old female with PMH of paroxysmal atrial fibrillation, anxiety, OSA , who was followed by Dr. [name]. Per chart review, patient has a long history of atrial fibrillation (approx 20 years). However, after first being diagnosed, she went multiple years without having known episodes of afib. She did have an episode of atrial fibrillation in 2019 while in [place]. She was admitted to a hospital there, treated with Cardizem IV, and eventually started on multaq, ranexa, atenolol, and eliquis. Noted that she had converted to NSR prior to being discharged.    She established care with Dr. [name] in 07/2017, and has been followed by the afib clinic.  Patient does not like to take medicines, and she is not on AC long term.  Usually, she is able to tell when she goes into A-fib and she is able to get a cardioversion in the ER within 48 hours.  She has a CHA2DS2-VASc of 1, she is willing to take anticoagulation for 4 weeks after cardioversions, but then stops.     Patient recently underwent an atrial fibrillation ablation on 03/25/2020.  Believes that she had been maintaining rhythm since the ablation.  She did complete her course of anticoagulation after the ablation, but has not been taking it since.   Patient felt like she developed A-fib over the weekend after she had URI symptoms.  She was able to feel fluttering in her chest, often feels syncopal.  Patient tried to go to the A-fib clinic which confirmed that she was in atrial fibrillation  with a heart rate of 181 bpm.  She was taken to the ER for further evaluation.  As patient has likely been in A-fib for greater than 48 hours, we were unable to do a cardioversion while in the ER.  She was started on IV diltiazem and cardiology was asked to consult    Hospital Course     Consultants: N/A   Paroxysmal Atrial Fibrillation with RVR  - 20 yrs of A fib, well controlled until 2019, not on anticoagulation for CHA2DS2-VASc of 1 and personal preference, s/p  ablation in 03/2021 by Dr [name], remained in [place] for 7 month post procedure, now present with A Fib RVR since 11/13/21 , feeling heart racing, lightheaded, headache, and neck vein discomfort  - TSH WNL - Mild leukocytosis POA, probably due to recent URI ; no other metabolic derangement ; HCG + - Echo was ordered, not needed before discharge thus cancelled today, may consider Echo outpatient at next follow up visit - started on metoprolol 12.5mg  BID, and IV cardizem at admission, converted to SR at 9:55am today, feeling much improved, BP low, would stop PTA atenolol , start Metoprolol 12.5mg  BID, daily, wean off IV cardizem today, no further need of TEE DCCV  - started Eliquis 5 mg twice daily, would continue for the time being  - Follow up with A fib clinic arranged on 11/2, referral made to EP Dr Quentin Ore   Positive HCG - patient is  post-menopausal, not sexually active, however, + HCG noted from 11/18/21 - repeat HCG today elevated at 7, appears she has historically elevated HCG, there is concern of endocrine disorder or malignancy , advised the patient to follow up with her PCP and GYN and will need ultrasound exam, she is agreeable        Did the patient have an acute coronary syndrome (MI, NSTEMI, STEMI, etc) this admission?:  No                               Did the patient have a percutaneous coronary intervention (stent / angioplasty)?:  No.        The patient will be scheduled for a TOC follow up appointment in 6 days.   A message has been sent to the [facility] and Scheduling Pool at the office where the patient should be seen for follow up.  _____________  Discharge Vitals Blood pressure 112/75, pulse 73, temperature 98.1 F (36.7 C), temperature source Oral, resp. rate 18, height 5\' 5"  (1.651 m), weight 100.9 kg, last menstrual period 08/09/2011, SpO2 100 %.  Filed Weights   11/18/21 1600 11/18/21 2020  Weight: 98.9 kg 100.9 kg   Vitals:  Vitals:   11/19/21 1100 11/19/21 1120  BP:  112/75  Pulse:  73  Resp:  18  Temp: 98.1 F (36.7 C)   SpO2:     General Appearance: In no apparent distress, sitting in chair  HEENT: Normocephalic, atraumatic.  Neck: Supple, trachea midline, no JVDs Cardiovascular: Regular rate and rhythm, normal S1-S2,  no murmur Respiratory: Resting breathing unlabored, lungs sounds clear to auscultation bilaterally, no use of accessory muscles. On room air.  No wheezes, rales or rhonchi.   Gastrointestinal: Bowel sounds positive, abdomen soft, non-tender Extremities: Able to move all extremities in bed without difficulty, no edema of BLE Musculoskeletal: Normal muscle bulk and tone Skin: Intact, warm, dry. No rashes or petechiae noted in exposed areas.  Neurologic: Alert, oriented to person, place and time. Fluent speech, no facial droop, no cognitive deficit Psychiatric: Normal affect. Mood is appropriate     Labs & Radiologic Studies    CBC Recent Labs    11/18/21 1447  WBC 12.0*  HGB 13.7  HCT 41.5  MCV 90.0  PLT [id number]   Basic Metabolic Panel Recent Labs    11/18/21 1447 11/19/21 0125  NA 140 137  K 4.2 3.7  CL 104 104  CO2 23 26  GLUCOSE 109* 103*  BUN 14 15  CREATININE 0.89 0.83  CALCIUM 9.3 8.5*  MG 2.0  --    Liver Function Tests No results for input(s): "AST", "ALT", "ALKPHOS", "BILITOT", "PROT", "ALBUMIN" in the last 72 hours. No results for input(s): "LIPASE", "AMYLASE" in the last 72 hours. High Sensitivity Troponin:   No results for  input(s): "TROPONINIHS" in the last 720 hours.  BNP Invalid input(s): "POCBNP" D-Dimer No results for input(s): "DDIMER" in the last 72 hours. Hemoglobin A1C No results for input(s): "HGBA1C" in the last 72 hours. Fasting Lipid Panel Recent Labs    11/19/21 0125  CHOL 172  HDL 38*  LDLCALC 115*  TRIG 94  CHOLHDL 4.5   Thyroid Function Tests Recent Labs    11/18/21 1447  TSH 2.569   _____________  DG Chest Port 1 View  Result Date: 11/18/2021 CLINICAL DATA:  Shortness of breath, atrial fibrillation with rapid ventricular response EXAM: PORTABLE CHEST 1 VIEW  COMPARISON:  Portable exam 1503 hours compared to 02/03/2020 FINDINGS: Borderline enlargement of cardiac silhouette. Mediastinal contours and pulmonary vascularity normal. Lungs clear. No infiltrate, pleural effusion, pneumothorax or acute osseous findings. IMPRESSION: No acute abnormalities. Electronically Signed   By: Lavonia Dana M.D.   On: 11/18/2021 15:12   MM DIAG BREAST W/IMPLANT TOMO BILATERAL  Result Date: 11/05/2021 CLINICAL DATA:  Palpable area in the RIGHT breast. History of reduction mammoplasty with subsequent implant placement. EXAM: DIGITAL DIAGNOSTIC BILATERAL MAMMOGRAM WITH IMPLANTS, TOMOSYNTHESIS; ULTRASOUND RIGHT BREAST LIMITED TECHNIQUE: Bilateral digital diagnostic mammography and breast tomosynthesis was performed. Standard and/or implant displaced views were performed.; Targeted ultrasound examination of the right breast was performed COMPARISON:  Previous exam(s). ACR Breast Density Category b: There are scattered areas of fibroglandular density. FINDINGS: Spot compression tomosynthesis views were obtained of the site of palpable concern in the RIGHT breast. There is a densely calcified fat containing area subjacent to the site of palpable concern consistent with a benign oil cyst/fat necrosis. No suspicious mammographic findings are noted subjacent to the site of concern. No suspicious mass, distortion, or  microcalcifications are identified to suggest presence of malignancy. The patient has retropectoral saline implants. On physical exam, there is a hard region in the upper breast at the site of palpable concern. Targeted ultrasound was performed of the site of palpable concern. At 11 o'clock 2 cm from nipple, there is an echogenic area with posterior acoustic shadowing consistent with coarse calcifications seen mammographically. This spans approximately 2.7 x 2.8 x 2.1 cm and is consistent with a benign oil cyst / fat necrosis. No suspicious cystic or solid mass is seen. IMPRESSION: 1. There is a benign oil cyst/fat necrosis at the site of palpable concern in the RIGHT breast. Any further workup of the patient's symptoms should be based on the clinical assessment. Recommend routine annual screening mammogram in 1 year. 2. No mammographic evidence of malignancy bilaterally. RECOMMENDATION: Screening mammogram in one year.(Code:SM-B-01Y) I have discussed the findings and recommendations with the patient. If applicable, a reminder letter will be sent to the patient regarding the next appointment. BI-RADS CATEGORY  2: Benign. Electronically Signed   By: Valentino Saxon M.D.   On: 11/05/2021 14:36   US BREAST LTD UNI RIGHT INC AXILLA  Result Date: 11/05/2021 CLINICAL DATA:  Palpable area in the RIGHT breast. History of reduction mammoplasty with subsequent implant placement. EXAM: DIGITAL DIAGNOSTIC BILATERAL MAMMOGRAM WITH IMPLANTS, TOMOSYNTHESIS; ULTRASOUND RIGHT BREAST LIMITED TECHNIQUE: Bilateral digital diagnostic mammography and breast tomosynthesis was performed. Standard and/or implant displaced views were performed.; Targeted ultrasound examination of the right breast was performed COMPARISON:  Previous exam(s). ACR Breast Density Category b: There are scattered areas of fibroglandular density. FINDINGS: Spot compression tomosynthesis views were obtained of the site of palpable concern in the RIGHT  breast. There is a densely calcified fat containing area subjacent to the site of palpable concern consistent with a benign oil cyst/fat necrosis. No suspicious mammographic findings are noted subjacent to the site of concern. No suspicious mass, distortion, or microcalcifications are identified to suggest presence of malignancy. The patient has retropectoral saline implants. On physical exam, there is a hard region in the upper breast at the site of palpable concern. Targeted ultrasound was performed of the site of palpable concern. At 11 o'clock 2 cm from nipple, there is an echogenic area with posterior acoustic shadowing consistent with coarse calcifications seen mammographically. This spans approximately 2.7 x 2.8 x 2.1 cm and is consistent with a benign  oil cyst / fat necrosis. No suspicious cystic or solid mass is seen. IMPRESSION: 1. There is a benign oil cyst/fat necrosis at the site of palpable concern in the RIGHT breast. Any further workup of the patient's symptoms should be based on the clinical assessment. Recommend routine annual screening mammogram in 1 year. 2. No mammographic evidence of malignancy bilaterally. RECOMMENDATION: Screening mammogram in one year.(Code:SM-B-01Y) I have discussed the findings and recommendations with the patient. If applicable, a reminder letter will be sent to the patient regarding the next appointment. BI-RADS CATEGORY  2: Benign. Electronically Signed   By: Valentino Saxon M.D.   On: 11/05/2021 14:36  Disposition   Patient was seen by Dr Debara Pickett today, feeling well post conversion, eager to go home. She is agreeable with medication changes. She is arranged for follow up. New script has been sent.  Pt is being discharged home today in good condition.  Follow-up Plans & Appointments     Follow-up Information     Sherran Needs, NP Follow up on 11/26/2021.   Specialties: Nurse Practitioner, Cardiology Why: Office moved to [facility] 6th floor,  please arrive at 3pm for your follow up appoitnment for A fib Contact information: [address] [place] [address] [phone]                Discharge Instructions     Amb referral to AFIB Clinic   Complete by: As directed    Diet - low sodium heart healthy   Complete by: As directed    Discharge instructions   Complete by: As directed    Follow up with A fib clinic on 11/26/21 as scheduled, office has moved to the main [facility] 6th floor   Office staff will call you to arrange appointment with EP Dr Quentin Ore   New medication: Metoprolol 12.5mg  twice daily  Eliquis 5mg  twice daily   Increase activity slowly   Complete by: As directed         Discharge Medications   Allergies as of 11/19/2021       Reactions   Codeine Nausea And Vomiting   Ivp Dye [iodinated Contrast Media] Itching        Medication List     STOP taking these medications    atenolol 25 MG tablet Commonly known as: TENORMIN       TAKE these medications    acetaminophen 500 MG tablet Commonly known as: TYLENOL Take 500 mg by mouth as needed for mild pain, moderate pain or headache.   ALPRAZolam 0.5 MG tablet Commonly known as: XANAX Take 0.5-1 tablets (0.25-0.5 mg total) by mouth at bedtime as needed for anxiety.   apixaban 5 MG Tabs tablet Commonly known as: ELIQUIS Take 1 tablet (5 mg total) by mouth 2 (two) times daily.   MAGNESIUM PO Take 400 mg by mouth every evening.   metoprolol tartrate 25 MG tablet Commonly known as: LOPRESSOR Take 1/2 tablet (12.5 mg total) by mouth 2 (two) times daily.   multivitamin with minerals tablet Take 2 tablets by mouth daily.   pantoprazole 40 MG tablet Commonly known as: PROTONIX Take 40 mg by mouth daily as needed (Fro stomach acid).           Outstanding Labs/Studies    Duration of Discharge Encounter   Greater than 30 minutes including physician time.  Signed, Margie Billet, NP 11/19/2021, 1:09  PM

## 2021-11-19 NOTE — Plan of Care (Signed)
Discharge instructions discussed with patient.  Patient instructed on home medications, restrictions, and follow up appointments. Belongings gathered and sent with patient.   Patient discharged via wheelchair by this writer.   

## 2021-11-20 IMAGING — CR DG CHEST 2V
2 series · 2 of 2 positions shown · non-contrast
Comparison: September 28, 2019

CLINICAL DATA: Chest pain.  Atrial fibrillation.

EXAM:
CHEST - 2 VIEW

[chest pa]
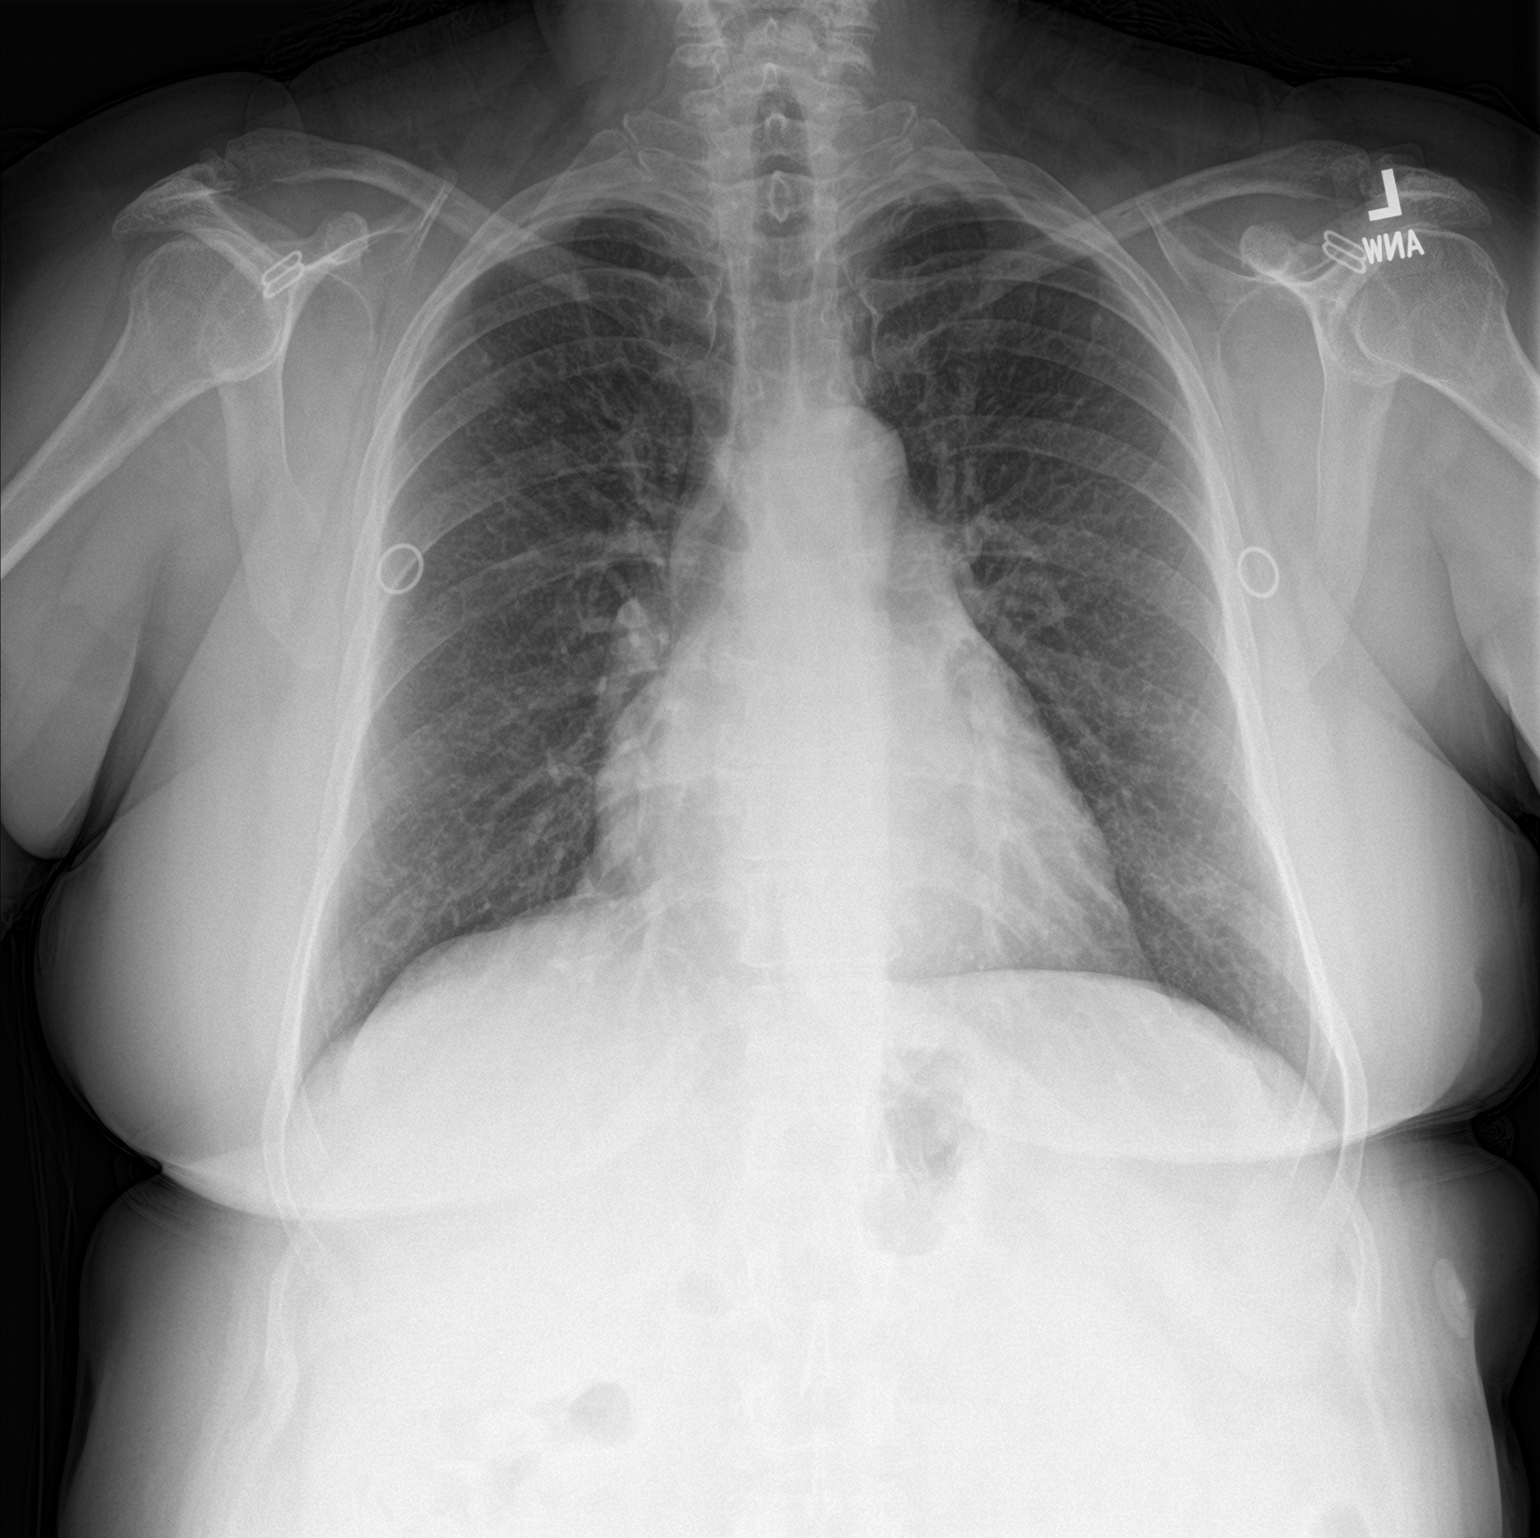

[chest lat]
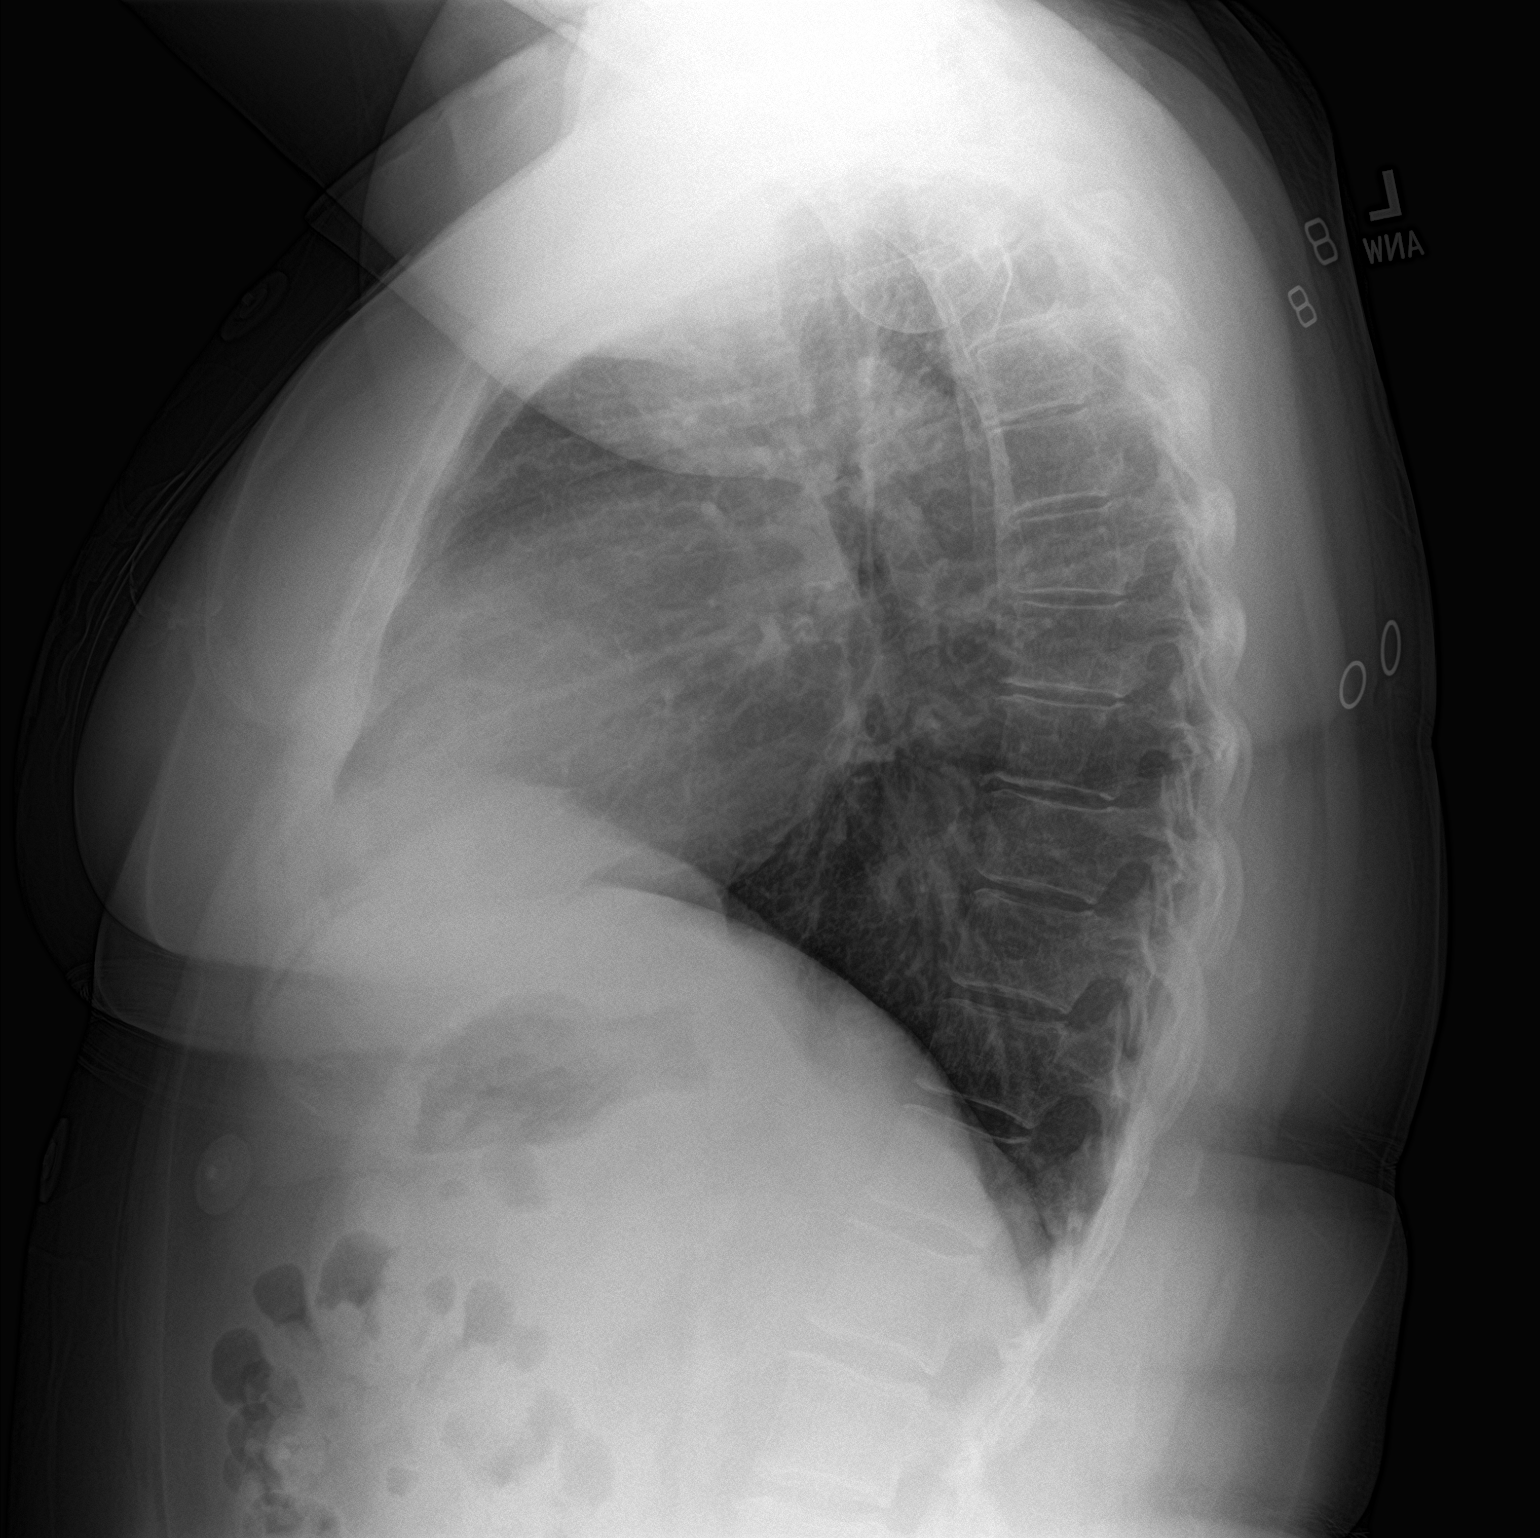

[2 of 2 positions shown; findings below may reference images not displayed]

FINDINGS: The heart size and mediastinal contours are within normal limits.
Both lungs are clear. The visualized skeletal structures are
unremarkable.
IMPRESSION: No active cardiopulmonary disease.

## 2021-11-23 DIAGNOSIS — D649 Anemia, unspecified: Secondary | ICD-10-CM | POA: Diagnosis not present

## 2021-11-23 DIAGNOSIS — R7989 Other specified abnormal findings of blood chemistry: Secondary | ICD-10-CM | POA: Diagnosis not present

## 2021-11-23 DIAGNOSIS — Z6839 Body mass index (BMI) 39.0-39.9, adult: Secondary | ICD-10-CM | POA: Diagnosis not present

## 2021-11-23 DIAGNOSIS — G4733 Obstructive sleep apnea (adult) (pediatric): Secondary | ICD-10-CM | POA: Diagnosis not present

## 2021-11-23 DIAGNOSIS — Z131 Encounter for screening for diabetes mellitus: Secondary | ICD-10-CM | POA: Diagnosis not present

## 2021-11-23 DIAGNOSIS — I480 Paroxysmal atrial fibrillation: Secondary | ICD-10-CM | POA: Diagnosis not present

## 2021-11-23 DIAGNOSIS — E6609 Other obesity due to excess calories: Secondary | ICD-10-CM | POA: Diagnosis not present

## 2021-11-23 DIAGNOSIS — E538 Deficiency of other specified B group vitamins: Secondary | ICD-10-CM | POA: Diagnosis not present

## 2021-11-26 ENCOUNTER — Ambulatory Visit (HOSPITAL_COMMUNITY): Payer: 59 | Admitting: Nurse Practitioner

## 2021-11-27 DIAGNOSIS — R7989 Other specified abnormal findings of blood chemistry: Secondary | ICD-10-CM | POA: Diagnosis not present

## 2021-12-08 ENCOUNTER — Ambulatory Visit (HOSPITAL_COMMUNITY): Payer: 59 | Admitting: Nurse Practitioner

## 2021-12-10 ENCOUNTER — Ambulatory Visit (HOSPITAL_COMMUNITY): Payer: 59 | Admitting: Nurse Practitioner

## 2021-12-14 ENCOUNTER — Other Ambulatory Visit (HOSPITAL_COMMUNITY): Payer: Self-pay | Admitting: *Deleted

## 2021-12-14 MED ORDER — METOPROLOL TARTRATE 25 MG PO TABS: 12.5000 mg | ORAL_TABLET | Freq: Two times a day (BID) | ORAL | 2 refills | Status: DC

## 2021-12-14 MED ORDER — APIXABAN 5 MG PO TABS: 5.0000 mg | ORAL_TABLET | Freq: Two times a day (BID) | ORAL | 2 refills | Status: DC

## 2021-12-16 ENCOUNTER — Ambulatory Visit: Payer: 59 | Admitting: Cardiology

## 2021-12-22 ENCOUNTER — Ambulatory Visit (HOSPITAL_COMMUNITY)
Admission: RE | Admit: 2021-12-22 | Discharge: 2021-12-22 | Disposition: A | Discharge status: Home / Self Care | Payer: 59 | Source: Ambulatory Visit | Attending: Nurse Practitioner | Admitting: Nurse Practitioner

## 2021-12-22 ENCOUNTER — Encounter (HOSPITAL_COMMUNITY): Payer: Self-pay | Admitting: Nurse Practitioner

## 2021-12-22 VITALS — BP 136/76 | HR 69 | Ht 65.0 in | Wt 221.0 lb

## 2021-12-22 DIAGNOSIS — G4733 Obstructive sleep apnea (adult) (pediatric): Secondary | ICD-10-CM | POA: Diagnosis not present

## 2021-12-22 DIAGNOSIS — I480 Paroxysmal atrial fibrillation: Secondary | ICD-10-CM | POA: Diagnosis not present

## 2021-12-22 DIAGNOSIS — Z79899 Other long term (current) drug therapy: Secondary | ICD-10-CM | POA: Diagnosis not present

## 2021-12-22 NOTE — Progress Notes (Signed)
Primary Care Physician: Marinda Elk, MD Referring Physician:  [name] is a 61 y.o. female with a h/o paroxysmal afib, sleep apnea, treated with CPAP that is in the afib clinic for f/u hospitalization for afib while in [place], [place] to attend a [place] rally in 2019. She was walking to her car and  was aware of increased HR, dizziness and lightheadedness and was taken to [facility].  She had been out in the sun for about 30 mins and her fluid intake had been less than usual  for the day. Her HR was 170 bpm. She was thought to have mild dehydration.She was admitted, placed on Cardizem drip for a couple of days then started on Multaq, ranexa, atenolol, eliquis and did convert to SR prior to discharge. The MD told pt that she would need quick f/u as multaq was for short term as it could cause liver failure. She is planning to see Dr. [name] soon. She would like to discuss coming off drugs and a possible ablation down the line. CHA2DS2VASc score is 1 for female. She would like to get clearance to have her knee surgery as she wants this first so she can get back to exercising to obtain wight loss. Echo was done at [facility] and with normal results( all records can be found in [place]).   She has had afib for many years but it had been quiet, treated in the [place] area until she moved to [place] 6 years ago. She saw Dr. [name] a couple of times but has not seen him since 2015.  She was suppose to have knee surgery last week but it was cancelled.  She states that she has gained  40 lbs over the last 6 years. She does use cpap, but still sleeps very poorly. Feels that she does get at least 4 hours a night use.  F/u in afib clinic 9/9. When she saw Dr. [name], he advised her to start weaning off the ranexa, multaq and DOAC which she has successfully done. She  feels well. No further afib. She did have a SBO which self released right after I saw her last in  clinic. No further reoccurrence.   F/u in afib clinic 02/22/18, as she had a ER visit for afib and went to the ER for a cardioversion. She had multaq and eliquis on hand and took these but did not help to restore SR.  The cardioversion was successful and she is in SR today. She still would prefer to stay off daily meds . She has talked to Dr. [name] about an ablation in the past but is still not quite ready for this either unless afib burden increases. She is seeing a weight loss specialist. She has not used her cpap for sleep apnea in some time, cannot tolerate mask. She thinks the trigger was lack of sleep and working long hours.   F/u in AF clinic 07/11/18. Patient reports that last night she began having feelings of SOB, weakness, and heart racing. In office today she is in afib with RVR and is very SOB, especially when walking. She has not passed out but has also been very lightheaded. She is not on Eliquis or Multaq at this time.  She was sent to the ER for urgent cardioversion.  F/u afib clinic, 07/21/18. She is s/p successful  CV and is staying in rhythm. She remains on Multaq and Eliquis but does not want to  stay on daily long term. She cannot tolerate the cpap and states that she sleeps poorly. She has stopped going to the weight loss MD as it was inconvenient for her in [place]. She has gained weight with Covid and working from home.   F/u in afib clinic, 03/30/19. She  had return of rapid afib last night with difficulty breathing and lightheadedness and asked to be seen today. She  in in afib at 138 bpm. BP soft around 100 systolic.  She is not on anticoagulation  for a CHA2DS2VASc score of 1. Last time I saw her in June she required an urgent cardioversion. Was on Multaq for a while but stopped in August 2020. She takes anticoagulation for the 4 weeks after cardioversion but then stops drug per protocol.  She did have an episode last week for a few hours of elevated HR but self converted. Discussed with  Dr. Johney Frame and he felt she should proceed to the ER for an urgent cardioversion as she is not on anticoagulation and is in the 48 hours of onset afib. Has not used cpap for OSA in past.  F/u in afib clinic, 04/13/19.  after successful cardioversion in the ER. She continues on eliquis 5 mg bid for post cardioversion protocol. Discussion today if she prefers to go back on Multaq bid to help prevent afib as she has had 2 cardioversions 6 months apart, or if she wants to discuss ablation with Dr. Johney Frame. She prefers to discuss with Dr. Johney Frame as she does not like to take meds. Will need updated echo.   F/u in afib clinic, 01/07/20. She had an appointment with Dr. Johney Frame 3 months ago but no change in approach to afib management was made. She is in SR today. No  further afib episodes to report. She is in SR today. She continues on atenolol,  CHA2DS2VASc score of 1, on daily asa and off eliquis after 4 weeks following last  cardioversion. She previously took Advertising copywriter, it worked well but she did not want to continue daily meds. She continues use of CPAP. She would prefer not to pursue ablation at this time.   F/u in afib clinic, 04/23/19. She is now one month s/p afib ablation and is very happy with the results. She has not noted any afib. No swallowing or groin issues. She is back to her usual activities. Remains on eliquis 5 mg bid for a CHA2DS2VASc score of 1.   F/u in afib clinic, 04/01/21, as she reported having 2-3 breakthrough afib episodes. The longest was 3 days. She was under some additional stress at that time. She is in SR today. Sheis not oon anticoagulation by guidelines with a CHA2DS2VASc  score of 1.   F/u in afib clinic, 12/22/21, after having to report to ED for a heart rate in afib around 180 bpm and palpable BP at 90 systolic and being symptomatic with presyncope. She was started on anticoagulation and converted with IV cardizem. She has not had any further afib. She is pending an appointment with  Dr. Lalla Brothers 12/20 to discuss second ablation. With a CHA2DS2VASc  score of 1, she wishes to stop anticoagulation since it has been one month since she chemically returned to SR, knowing if she has an ablation she will need to start it back 3 weeks prior.    Today, she denies symptoms of palpitations, chest pain,+ shortness of breath/lightedness, no orthopnea, PND, lower extremity edema, dizziness, presyncope, syncope, or neurologic sequela. The patient is tolerating  medications without difficulties and is otherwise without complaint today.   Past Medical History:  Diagnosis Date   Anxiety    B12 deficiency    Difficult intubation    "SMALL TRACHEA"   GERD (gastroesophageal reflux disease)    History of kidney stones    H/O   OSA (obstructive sleep apnea)    USES CPAP ( rarely)   Paroxysmal atrial fibrillation ([place])    Past Surgical History:  Procedure Laterality Date   ATRIAL FIBRILLATION ABLATION N/A 03/25/2020   Procedure: ATRIAL FIBRILLATION ABLATION;  Surgeon: Thompson Grayer, MD;  Location: [facility];  Service: Cardiovascular;  Laterality: N/A;   AUGMENTATION MAMMAPLASTY  2003   BREAST BIOPSY Right 2005   benign   COLONOSCOPY     DILATATION & CURETTAGE/HYSTEROSCOPY WITH MYOSURE N/A 01/02/2018   Procedure: [place], polypectomy;  Surgeon: Benjaman Kindler, MD;  Location: [facility];  Service: Gynecology;  Laterality: N/A;   ESOPHAGOGASTRODUODENOSCOPY     KNEE ARTHROSCOPY Left 12/02/2017   Procedure: ARTHROSCOPY KNEE WITH PARTIAL MEDIAL MENISECTOMY, REMOVAL OF LOOSE BODY;  Surgeon: Leim Fabry, MD;  Location: [facility];  Service: Orthopedics;  Laterality: Left;   REDUCTION MAMMAPLASTY  2007   TUMMY TUCK  2006    Current Outpatient Medications  Medication Sig Dispense Refill   acetaminophen (TYLENOL) 500 MG tablet Take 500 mg by mouth as needed for mild pain, moderate pain or headache.     ALPRAZolam (XANAX) 0.5 MG tablet Take 0.5-1  tablets (0.25-0.5 mg total) by mouth at bedtime as needed for anxiety. 30 tablet 0   MAGNESIUM PO Take 400 mg by mouth every evening.     metoprolol tartrate (LOPRESSOR) 25 MG tablet Take 1/2 tablet (12.5 mg total) by mouth 2 (two) times daily. 30 tablet 2   Multiple Vitamins-Minerals (MULTIVITAMIN WITH MINERALS) tablet Take 2 tablets by mouth daily.     pantoprazole (PROTONIX) 40 MG tablet Take 40 mg by mouth daily as needed (Fro stomach acid).     No current facility-administered medications for this encounter.    Allergies  Allergen Reactions   Codeine Nausea And Vomiting   Ivp Dye [Iodinated Contrast Media] Itching    Social History   Socioeconomic History   Marital status: Divorced    Spouse name: Not on file   Number of children: Not on file   Years of education: Not on file   Highest education level: Not on file  Occupational History   Occupation: realtor  Tobacco Use   Smoking status: Never   Smokeless tobacco: Never   Tobacco comments:    Never smoke 12/22/21  Vaping Use   Vaping Use: Never used  Substance and Sexual Activity   Alcohol use: Yes    Alcohol/week: 1.0 standard drink of alcohol    Types: 1 Glasses of wine per week   Drug use: Never   Sexual activity: Not on file  Other Topics Concern   Not on file  Social History Narrative   Lives in [place]    Works as a Producer, 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   Social Determinants of Health   Financial Resource Strain: Not on file  Food Insecurity: No Food Insecurity (11/18/2021)   Hunger Vital Sign    Worried About Running Out of Food in the Last Year: Never true    Ran Out of Food in the Last Year: Never true  Transportation Needs: No Transportation Needs (11/18/2021)   [place] - Transportation  Lack of Transportation (Medical): No    Lack of Transportation (Non-Medical): No  Physical Activity: Not on file  Stress: Not on file  Social Connections: Not on file  Intimate Partner  Violence: Not At Risk (11/18/2021)   Humiliation, Afraid, Rape, and Kick questionnaire    Fear of Current or Ex-Partner: No    Emotionally Abused: No    Physically Abused: No    Sexually Abused: No    Family History  Problem Relation Age of Onset   CAD Mother    Heart disease Mother    Pancreatic cancer Father     ROS- All systems are reviewed and negative except as per the HPI above  Physical Exam: Vitals:   12/22/21 1523  BP: 136/76  Pulse: 69  Weight: 100.2 kg  Height: 5\' 5"  (1.651 m)   Wt Readings from Last 3 Encounters:  12/22/21 100.2 kg  11/18/21 100.9 kg  06/04/21 98 kg    Labs: Lab Results  Component Value Date   NA 137 11/19/2021   K 3.7 11/19/2021   CL 104 11/19/2021   CO2 26 11/19/2021   GLUCOSE 103 (H) 11/19/2021   BUN 15 11/19/2021   CREATININE 0.83 11/19/2021   CALCIUM 8.5 (L) 11/19/2021   MG 2.0 11/18/2021   Lab Results  Component Value Date   INR 1.13 08/17/2017   Lab Results  Component Value Date   CHOL 172 11/19/2021   HDL 38 (L) 11/19/2021   LDLCALC 115 (H) 11/19/2021   TRIG 94 11/19/2021     GEN- The patient is well appearing, alert and oriented x 3 today.   Head- normocephalic, atraumatic Eyes-  Sclera clear, conjunctiva pink Ears- hearing intact Oropharynx- clear Neck- supple, no JVP Lymph- no cervical lymphadenopathy Lungs- Clear to ausculation bilaterally, normal work of breathing Heart-  regular rate and rhythm, no murmurs, rubs or gallops, PMI not laterally displaced GI- soft, NT, ND, + BS Extremities- no clubbing, cyanosis, or edema MS- no significant deformity or atrophy Skin- no rash or lesion Psych- euthymic mood, full affect Neuro- strength and sensation are intact  EKG - Vent. rate 69 BPM PR interval 126 ms QRS duration 86 ms QT/QTcB 414/443 ms P-R-T axes 46 -2 60 Normal sinus rhythm Minimal voltage criteria for LVH, may be normal variant ( R in aVL ) Borderline ECG When compared with ECG of 18-Nov-2021  14:34, PREVIOUS ECG IS PRESENT Atrial fibrillation RESOLVED SINCE PREVIOUS Confirmed by Kirk Ruths [phone]) on 12/22/2021 4:04:54 PM    Assessment and Plan: 1.Symptomatic  paroxysmal afib with RVR s/p ablation but has had return of afib recently and goes very fast when she is in afib and required hospitalization for same 11/18/21 Had been maintaining  SR until this past January with now 4 breakthrough episodes  She does not want daily AAD's She has 30 mg Cardizem to use every 4 hours  for breakthrough afib  Can now stop  anticoagulation for CHA2DS2VASc score of 1 (female) as it has been 4 weeks since chemical conversion to SR, she would rather not stay on it until ablation   Continue  12.5 mg metoprolol tartrate  bid  She has an appointment with EP to consider repeat ablation   F/u with Dr. Quentin Ore 01/13/22  Geroge Baseman. Lener Ventresca, [facility] [address] [place],  [address] [phone]

## 2021-12-25 DIAGNOSIS — R7989 Other specified abnormal findings of blood chemistry: Secondary | ICD-10-CM | POA: Diagnosis not present

## 2021-12-29 DIAGNOSIS — N951 Menopausal and female climacteric states: Secondary | ICD-10-CM | POA: Diagnosis not present

## 2021-12-29 DIAGNOSIS — R7989 Other specified abnormal findings of blood chemistry: Secondary | ICD-10-CM | POA: Diagnosis not present

## 2022-01-07 DIAGNOSIS — R519 Headache, unspecified: Secondary | ICD-10-CM | POA: Diagnosis not present

## 2022-01-07 DIAGNOSIS — M542 Cervicalgia: Secondary | ICD-10-CM | POA: Diagnosis not present

## 2022-01-07 DIAGNOSIS — M5412 Radiculopathy, cervical region: Secondary | ICD-10-CM | POA: Diagnosis not present

## 2022-01-07 DIAGNOSIS — M9901 Segmental and somatic dysfunction of cervical region: Secondary | ICD-10-CM | POA: Diagnosis not present

## 2022-01-13 ENCOUNTER — Ambulatory Visit: Payer: 59 | Attending: Cardiology | Admitting: Cardiology

## 2022-01-13 ENCOUNTER — Encounter: Payer: Self-pay | Admitting: Cardiology

## 2022-01-13 VITALS — BP 126/80 | HR 77 | Ht 65.5 in | Wt 219.4 lb

## 2022-01-13 DIAGNOSIS — G4733 Obstructive sleep apnea (adult) (pediatric): Secondary | ICD-10-CM | POA: Diagnosis not present

## 2022-01-13 DIAGNOSIS — I480 Paroxysmal atrial fibrillation: Secondary | ICD-10-CM

## 2022-01-13 NOTE — Patient Instructions (Signed)
Medication Instructions:  - Your physician has recommended you make the following change in your medication:   4 weeks prior to your ablation: - START Eliquis 5 mg: Take 1 tablet by mouth twice daily   *If you need a refill on your cardiac medications before your next appointment, please call your pharmacy*   Lab Work: - pending the date of your A-fib ablation  If you have labs (blood work) drawn today and your tests are completely normal, you will receive your results only by: MyChart Message (if you have MyChart) OR A paper copy in the mail If you have any lab test that is abnormal or we need to change your treatment, we will call you to review the results.   Testing/Procedures:  1) Atrial Fibrillation Ablation: - Your physician has recommended that you have an ablation. Catheter ablation is a medical procedure used to treat some cardiac arrhythmias (irregular heartbeats). During catheter ablation, a long, thin, flexible tube is put into a blood vessel in your groin (upper thigh), or neck. This tube is called an ablation catheter. It is then guided to your heart through the blood vessel. Radio frequency waves destroy small areas of heart tissue where abnormal heartbeats may cause an arrhythmia to start.   You will be contacted by April Garrison, CMA to schedule to your procedure   2) You will have to have a Cardiac CT about week prior to the ablation - April will give you these instructions as well    Follow-Up: At [facility], you and your health needs are our priority.  As part of our continuing mission to provide you with exceptional heart care, we have created designated Provider Care Teams.  These Care Teams include your primary Cardiologist (physician) and Advanced Practice Providers (APPs -  Physician Assistants and Nurse Practitioners) who all work together to provide you with the care you need, when you need it.  We recommend signing up for the patient portal  called "MyChart".  Sign up information is provided on this After Visit Summary.  MyChart is used to connect with patients for Virtual Visits (Telemedicine).  Patients are able to view lab/test results, encounter notes, upcoming appointments, etc.  Non-urgent messages can be sent to your provider as well.   To learn more about what you can do with MyChart, go to [email or web address].    Your next appointment:   1) 4 weeks post ablaltion with the A-Fib Clinic in the [place] office  2) 3 months post ablation with Dr. Lalla Brothers   The format for your next appointment:   In Person  Provider:   As above     Other Instructions  Cardiac Ablation Cardiac ablation is a procedure to destroy (ablate) heart tissue that is sending bad signals. These bad signals cause the heart to beat very fast or in a way that is not normal. Destroying some tissues can help make the heart rhythm normal. Tell your doctor about: Any allergies you have. All medicines you are taking. These include vitamins, herbs, eye drops, creams, and over-the-counter medicines. Any problems you or family members have had with anesthesia. Any bleeding problems you have. Any surgeries you have had. Any medical conditions you have. Whether you are pregnant or may be pregnant. What are the risks? Your doctor will talk with you about risks. These may include: Infection. Bruising and bleeding. Stroke or blood clots. Damage to nearby areas of your body. Allergies to medicines or dyes. Needing a pacemaker if the  heart gets damaged. A pacemaker helps the heart beat normally. The procedure not working. What happens before the procedure? Medicines Ask your doctor about changing or stopping: Your normal medicines. Vitamins, herbs, and supplements. Over-the-counter medicines. Do not take aspirin or ibuprofen unless you are told to. General instructions Follow instructions from your doctor about what you may eat and drink. If you  will be going home right after the procedure, plan to have a responsible adult: Take you home from the hospital or clinic. You will not be allowed to drive. Care for you for the time you are told. Ask your doctor what steps will be taken to prevent the spread of germs. What happens during the procedure?  An IV tube will be put into one of your veins. You may be given: A sedative. This helps you relax. Anesthesia. This will: Numb certain areas of your body. The skin on your neck or groin will be numbed. A cut (incision) will be made in your neck or groin. A needle will be put through the cut and into a large vein. The small, thin tube (catheter) will be put into the needle. The tube will be moved to your heart. A type of X-ray (fluoroscopy) will be used to help guide the tube. It will also show constant images of the heart on a screen. Dye may be put through the tube. This helps your doctor see your heart. An electric current will be sent from the tube to destroy heart tissue in certain areas. The tube will be taken out. Pressure will be held on your cut. This helps stop bleeding. A bandage (dressing) will be put over your cut. The procedure may vary among doctors and hospitals. What happens after the procedure? You will be monitored until you leave the hospital or clinic. This includes checking your blood pressure, heart rate and rhythm, breathing rate, and blood oxygen level. Your cut will be checked for bleeding. You will need to lie still for a few hours. If your groin was used, you will need to keep your leg straight for a few hours after the small, thin tube is removed. This information is not intended to replace advice given to you by your health care provider. Make sure you discuss any questions you have with your health care provider. Document Revised: 06/30/2021 Document Reviewed: 06/30/2021 Elsevier Patient Education  Tracey Lin

## 2022-01-13 NOTE — Progress Notes (Signed)
Electrophysiology Office Follow up Visit Note:    Date:  01/13/2022   ID:  Tracey Lin, DOB 1960/04/06, MRN [id number]  PCP:  Marinda Elk, MD  [facility] Cardiologist:  Roderic Palau, NP  [facility] Electrophysiologist:  Vickie Epley, MD    Interval History:    Tracey Lin is a 61 y.o. female who presents for a follow up visit.  They last saw Roderic Palau December 22, 2021.  She was previously seen by Dr. [name].  She has had atrial fibrillation for years.  Previously treated in [place].  Has been seen by Dr. Henrene Pastor shows as well.  Uses CPAP for her sleep apnea.  When she saw Butch Penny last November 28, she had gone to the emergency department for A-fib with RVR with ventricular rates in the 180s.  She was hypotensive, presyncopal.  She was started on anticoagulation and converted back to sinus rhythm with IV Cardizem.  She has had a prior A-fib ablation by Dr. [name] March 25, 2020.  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  The patient presents to clinic today to discuss possible repeat ablation.       Past Medical History:  Diagnosis Date   Anxiety    B12 deficiency    Difficult intubation    "SMALL TRACHEA"   GERD (gastroesophageal reflux disease)    History of kidney stones    H/O   OSA (obstructive sleep apnea)    USES CPAP ( rarely)   Paroxysmal atrial fibrillation ([place])     Past Surgical History:  Procedure Laterality Date   ATRIAL FIBRILLATION ABLATION N/A 03/25/2020   Procedure: ATRIAL FIBRILLATION ABLATION;  Surgeon: Thompson Grayer, MD;  Location: [place] [facility];  Service: Cardiovascular;  Laterality: N/A;   AUGMENTATION MAMMAPLASTY  2003   BREAST BIOPSY Right 2005   benign   COLONOSCOPY     DILATATION & CURETTAGE/HYSTEROSCOPY WITH MYOSURE N/A 01/02/2018   Procedure: [place], polypectomy;  Surgeon: Benjaman Kindler, MD;  Location: [facility];  Service: Gynecology;   Laterality: N/A;   ESOPHAGOGASTRODUODENOSCOPY     KNEE ARTHROSCOPY Left 12/02/2017   Procedure: ARTHROSCOPY KNEE WITH PARTIAL MEDIAL MENISECTOMY, REMOVAL OF LOOSE BODY;  Surgeon: Leim Fabry, MD;  Location: [facility];  Service: Orthopedics;  Laterality: Left;   REDUCTION MAMMAPLASTY  2007   TUMMY TUCK  2006    Current Medications: Current Meds  Medication Sig   acetaminophen (TYLENOL) 500 MG tablet Take 500 mg by mouth as needed for mild pain, moderate pain or headache.   ALPRAZolam (XANAX) 0.5 MG tablet Take 0.5-1 tablets (0.25-0.5 mg total) by mouth at bedtime as needed for anxiety.   MAGNESIUM PO Take 400 mg by mouth every evening.   metoprolol tartrate (LOPRESSOR) 25 MG tablet Take 1/2 tablet (12.5 mg total) by mouth 2 (two) times daily.   Multiple Vitamins-Minerals (MULTIVITAMIN WITH MINERALS) tablet Take 2 tablets by mouth daily.   pantoprazole (PROTONIX) 40 MG tablet Take 40 mg by mouth daily as needed (Fro stomach acid).     Allergies:   Codeine and Ivp dye [iodinated contrast media]   Social History   Socioeconomic History   Marital status: Divorced    Spouse name: Not on file   Number of children: Not on file   Years of education: Not on file   Highest education level: Not on file  Occupational History   Occupation: realtor  Tobacco Use   Smoking  status: Never   Smokeless tobacco: Never   Tobacco comments:    Never smoke 12/22/21  Vaping Use   Vaping Use: Never used  Substance and Sexual Activity   Alcohol use: Yes    Alcohol/week: 1.0 standard drink of alcohol    Types: 1 Glasses of wine per week   Drug use: Never   Sexual activity: Not on file  Other Topics Concern   Not on file  Social History Narrative   Lives in [place]    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   Social Determinants of Health   Financial Resource Strain: Not on file  Food Insecurity: No Food Insecurity (11/18/2021)   Hunger Vital Sign    Worried About  Running Out of Food in the Last Year: Never true    Ran Out of Food in the Last Year: Never true  Transportation Needs: No Transportation Needs (11/18/2021)   PRAPARE - Administrator, Civil Service (Medical): No    Lack of Transportation (Non-Medical): No  Physical Activity: Not on file  Stress: Not on file  Social Connections: Not on file     Family History: The patient's family history includes CAD in her mother; Heart disease in her mother; Pancreatic cancer in her father.  ROS:   Please see the history of present illness.    All other systems reviewed and are negative.  EKGs/Labs/Other Studies Reviewed:    The following studies were reviewed today:  12/22/2021 EKG shows sinus rhythm without preexcitation.  Normal intervals.    Recent Labs: 11/18/2021: Hemoglobin 13.7; Magnesium 2.0; Platelets 386; TSH 2.569 11/19/2021: BUN 15; Creatinine, Ser 0.83; Potassium 3.7; Sodium 137  Recent Lipid Panel    Component Value Date/Time   CHOL 172 11/19/2021 0125   CHOL 220 (H) 02/07/2018 1029   TRIG 94 11/19/2021 0125   HDL 38 (L) 11/19/2021 0125   HDL 59 02/07/2018 1029   CHOLHDL 4.5 11/19/2021 0125   VLDL 19 11/19/2021 0125   LDLCALC 115 (H) 11/19/2021 0125   LDLCALC 136 (H) 02/07/2018 1029    Physical Exam:    VS:  BP 126/80 (BP Location: Left Arm, Patient Position: Sitting, Cuff Size: Normal)   Pulse 77   Ht 5' 5.5" (1.664 m)   Wt 219 lb 6 oz (99.5 kg)   LMP 08/09/2011 (Approximate) Comment: PT STILL SPOTS AS OF 08-08-17  SpO2 96%   BMI 35.95 kg/m     Wt Readings from Last 3 Encounters:  01/13/22 219 lb 6 oz (99.5 kg)  12/22/21 221 lb (100.2 kg)  11/18/21 222 lb 7.1 oz (100.9 kg)     GEN:  Well nourished, well developed in no acute distress.  Obese HEENT: Normal NECK: No JVD; No carotid bruits LYMPHATICS: No lymphadenopathy CARDIAC: RRR, no murmurs, rubs, gallops RESPIRATORY:  Clear to auscultation without rales, wheezing or rhonchi  ABDOMEN:  Soft, non-tender, non-distended MUSCULOSKELETAL:  No edema; No deformity  SKIN: Warm and dry NEUROLOGIC:  Alert and oriented x 3 PSYCHIATRIC:  Normal affect        ASSESSMENT:    1. Paroxysmal atrial fibrillation (HCC)   2. OSA (obstructive sleep apnea)    PLAN:    In order of problems listed above:  #Paroxysmal atrial fibrillation Recurrent and highly symptomatic.  Prior ablation in March 2022 with PVI performed. Discussed the treatment options for her recurrent arrhythmia during today's visit including antiarrhythmic drugs and catheter ablation.  The patient would  like to pursue catheter ablation which I think is very reasonable.  Discussed treatment options today for their AF including antiarrhythmic drug therapy and ablation. Discussed risks, recovery and likelihood of success. Discussed potential need for repeat ablation procedures and antiarrhythmic drugs after an initial ablation. They wish to proceed with scheduling.  Risk, benefits, and alternatives to EP study and radiofrequency ablation for afib were also discussed in detail today. These risks include but are not limited to stroke, bleeding, vascular damage, tamponade, perforation, damage to the esophagus, lungs, and other structures, pulmonary vein stenosis, worsening renal function, and death. The patient understands these risk and wishes to proceed.  We will therefore proceed with catheter ablation at the next available time.  Carto, ICE, anesthesia are requested for the procedure.  Will also obtain CT PV protocol prior to the procedure to exclude LAA thrombus and further evaluate atrial anatomy.  During the procedure will need to do a full EP study and if AVNRT is inducible, perform slow pathway modification as this may be contributing to her arrhythmia burden.  She will need to start Eliquis 4 weeks prior to the scheduled procedure with plans to continue it for at least 6 months following catheter  ablation.  CHA2DS2-VASc Score = 1  The patient's score is based upon: CHF History: 0 HTN History: 0 Diabetes History: 0 Stroke History: 0 Vascular Disease History: 0 Age Score: 0 Gender Score: 1   #OSA Encouraged continued CPAP use     Medication Adjustments/Labs and Tests Ordered: Current medicines are reviewed at length with the patient today.  Concerns regarding medicines are outlined above.  No orders of the defined types were placed in this encounter.  No orders of the defined types were placed in this encounter.    Signed, Lars Mage, MD, [facility], [facility] 01/13/2022 11:41 AM    Electrophysiology [facility]

## 2022-01-20 ENCOUNTER — Emergency Department (HOSPITAL_COMMUNITY): Payer: 59

## 2022-01-20 ENCOUNTER — Other Ambulatory Visit: Payer: Self-pay

## 2022-01-20 ENCOUNTER — Encounter (HOSPITAL_COMMUNITY): Payer: Self-pay

## 2022-01-20 ENCOUNTER — Observation Stay (HOSPITAL_COMMUNITY)
Admission: EM | Admit: 2022-01-20 | Discharge: 2022-01-21 | Disposition: A | Discharge status: Home / Self Care | Payer: 59 | Attending: Cardiovascular Disease | Admitting: Cardiovascular Disease

## 2022-01-20 ENCOUNTER — Telehealth (HOSPITAL_COMMUNITY): Payer: Self-pay | Admitting: *Deleted

## 2022-01-20 DIAGNOSIS — R42 Dizziness and giddiness: Secondary | ICD-10-CM | POA: Diagnosis not present

## 2022-01-20 DIAGNOSIS — I480 Paroxysmal atrial fibrillation: Principal | ICD-10-CM | POA: Insufficient documentation

## 2022-01-20 DIAGNOSIS — R0602 Shortness of breath: Secondary | ICD-10-CM | POA: Diagnosis not present

## 2022-01-20 DIAGNOSIS — Z79899 Other long term (current) drug therapy: Secondary | ICD-10-CM | POA: Diagnosis not present

## 2022-01-20 DIAGNOSIS — I4891 Unspecified atrial fibrillation: Secondary | ICD-10-CM | POA: Diagnosis not present

## 2022-01-20 LAB — BASIC METABOLIC PANEL
Anion gap: 10 (ref 5–15)
BUN: 16 mg/dL (ref 6–20)
CO2: 23 mmol/L (ref 22–32)
Calcium: 8.9 mg/dL (ref 8.9–10.3)
Chloride: 104 mmol/L (ref 98–111)
Creatinine, Ser: 0.93 mg/dL (ref 0.44–1.00)
GFR, Estimated: >60 mL/min (ref >60)
Glucose, Bld: 103 mg/dL — ABNORMAL HIGH (ref 70–99)
Potassium: 4.5 mmol/L (ref 3.5–5.1)
Sodium: 137 mmol/L (ref 135–145)

## 2022-01-20 LAB — CBC WITH DIFFERENTIAL/PLATELET
Abs Immature Granulocytes: 0.04 10*3/uL (ref 0.00–0.07)
Basophils Absolute: 0 10*3/uL (ref 0.0–0.1)
Basophils Relative: 0 %
Eosinophils Absolute: 0.2 10*3/uL (ref 0.0–0.5)
Eosinophils Relative: 1 %
HCT: 44.2 % (ref 36.0–46.0)
Hemoglobin: 14 g/dL (ref 12.0–15.0)
Immature Granulocytes: 0 %
Lymphocytes Relative: 42 %
Lymphs Abs: 4.5 10*3/uL — ABNORMAL HIGH (ref 0.7–4.0)
MCH: 29.4 pg (ref 26.0–34.0)
MCHC: 31.7 g/dL (ref 30.0–36.0)
MCV: 92.7 fL (ref 80.0–100.0)
Monocytes Absolute: 0.7 10*3/uL (ref 0.1–1.0)
Monocytes Relative: 6 %
Neutro Abs: 5.3 10*3/uL (ref 1.7–7.7)
Neutrophils Relative %: 51 %
Platelets: 384 10*3/uL (ref 150–400)
RBC: 4.77 MIL/uL (ref 3.87–5.11)
RDW: 12.7 % (ref 11.5–15.5)
WBC: 10.8 10*3/uL — ABNORMAL HIGH (ref 4.0–10.5)
nRBC: 0 % (ref 0.0–0.2)

## 2022-01-20 LAB — MAGNESIUM: Magnesium: 2 mg/dL (ref 1.7–2.4)

## 2022-01-20 LAB — TROPONIN I (HIGH SENSITIVITY): Troponin I (High Sensitivity): 17 ng/L (ref <18)

## 2022-01-20 LAB — HIV ANTIBODY (ROUTINE TESTING W REFLEX): HIV Screen 4th Generation wRfx: NONREACTIVE

## 2022-01-20 LAB — TSH: TSH: 3.649 u[IU]/mL (ref 0.350–4.500)

## 2022-01-20 MED ORDER — DILTIAZEM HCL-DEXTROSE 125-5 MG/125ML-% IV SOLN (PREMIX)
5.0000 mg/h | INTRAVENOUS | Status: DC
  Administered 2022-01-20: 5 mg/h via INTRAVENOUS
  Filled 2022-01-20 (×2): qty 125

## 2022-01-20 MED ORDER — ALPRAZOLAM 0.25 MG PO TABS
0.5000 mg | ORAL_TABLET | Freq: Two times a day (BID) | ORAL | Status: DC | PRN
  Administered 2022-01-20: 0.5 mg via ORAL
  Filled 2022-01-20: qty 2

## 2022-01-20 MED ORDER — PANTOPRAZOLE SODIUM 40 MG PO TBEC: 40.0000 mg | DELAYED_RELEASE_TABLET | Freq: Every day | ORAL | Status: DC | PRN

## 2022-01-20 MED ORDER — HEPARIN (PORCINE) 25000 UT/250ML-% IV SOLN
1150.0000 [IU]/h | INTRAVENOUS | Status: DC
  Administered 2022-01-20: 1150 [IU]/h via INTRAVENOUS
  Filled 2022-01-20: qty 250

## 2022-01-20 MED ORDER — METOPROLOL TARTRATE 25 MG PO TABS
12.5000 mg | ORAL_TABLET | Freq: Two times a day (BID) | ORAL | Status: DC
  Administered 2022-01-20 – 2022-01-21 (×2): 12.5 mg via ORAL
  Filled 2022-01-20 (×2): qty 1

## 2022-01-20 MED ORDER — HEPARIN BOLUS VIA INFUSION
4000.0000 [IU] | Freq: Once | INTRAVENOUS | Status: AC
  Administered 2022-01-20: 4000 [IU] via INTRAVENOUS
  Filled 2022-01-20: qty 4000

## 2022-01-20 MED ORDER — ACETAMINOPHEN 325 MG PO TABS: 650.0000 mg | ORAL_TABLET | ORAL | Status: DC | PRN

## 2022-01-20 MED ORDER — DILTIAZEM LOAD VIA INFUSION
10.0000 mg | Freq: Once | INTRAVENOUS | Status: AC
  Administered 2022-01-20: 10 mg via INTRAVENOUS
  Filled 2022-01-20: qty 10

## 2022-01-20 NOTE — Progress Notes (Signed)
ANTICOAGULATION CONSULT NOTE - Initial Consult  Pharmacy Consult for heparin Indication: atrial fibrillation  Allergies  Allergen Reactions   Codeine Nausea And Vomiting   Ivp Dye [Iodinated Contrast Media] Itching    Patient Measurements: Height: 5' 5.5" (166.4 cm) Weight: 98.9 kg (218 lb) IBW/kg (Calculated) : 58.15 Heparin Dosing Weight: 80.5kg  Vital Signs: Temp: 97.9 F (36.6 C) (12/27 1618) Temp Source: Oral (12/27 1618) BP: 132/93 (12/27 1815) Pulse Rate: 102 (12/27 1815)  Labs: Recent Labs    01/20/22 1720  HGB 14.0  HCT 44.2  PLT 384  CREATININE 0.93  TROPONINIHS 17    Estimated Creatinine Clearance: 75.7 mL/min (by C-G formula based on SCr of 0.93 mg/dL).   Medical History: Past Medical History:  Diagnosis Date   Anxiety    B12 deficiency    Difficult intubation    "SMALL TRACHEA"   GERD (gastroesophageal reflux disease)    History of kidney stones    H/O   OSA (obstructive sleep apnea)    USES CPAP ( rarely)   Paroxysmal atrial fibrillation (HCC)     Assessment: 61 YOF presenting with SOB and palpitations, hx of afib and has been on Eliquis in past, she states she has not taken this in about a month  Goal of Therapy:  Heparin level 0.3-0.7 units/ml Monitor platelets by anticoagulation protocol: Yes   Plan:  Heparin 4000 units IV x 1, and gtt at 1150 units/hr F/u 6 hour heparin level  Daylene Posey, PharmD, [facility] Clinical Pharmacist ED Pharmacist Phone # [phone] 01/20/2022 7:05 PM

## 2022-01-20 NOTE — H&P (Signed)
Physician History and Physical     Patient ID: Tracey Lin MRN: [id number] DOB/AGE: 1960-04-28 61 y.o. Admit date: 01/20/2022  Primary Care Physician: Patrice Paradise, MD Primary Cardiologist: Lalla Brothers  Principal Problem:   Atrial fibrillation with RVR [facility])   HPI:  61 y.o. admitted with rapid atrial fibrillation. History of anxiety, OSA, GERD and prior ablation with Dr Johney Frame 03/25/20 At that time had AVNRT not RX. CHADVASC 1 not on anticoagulation. Had similar episode November with rapid afib and converted with iv cardizem. TTE has been normal 01/30/20 LAd 39 mm. Cardiac CTA 02/2020 with calcium score 0 no CAD and normal PV anatomy. Has had rapid palpitations for 48 hours In ER rapid afib rates 170-180 no delta wave or wide QRS. No chest pain or dyspnea BP stable. Labs normal K 4.5 Admits to not wearing CPAP. Had looked into Inspiris device with local ENT but did not like the idea. Ablation not scheduled till  04/22/21. No bleeding issues and has been on anticoagulation in past for short periods without issue   Review of systems complete and found to be negative unless listed above   Past Medical History:  Diagnosis Date   Anxiety    B12 deficiency    Difficult intubation    "SMALL TRACHEA"   GERD (gastroesophageal reflux disease)    History of kidney stones    H/O   OSA (obstructive sleep apnea)    USES CPAP ( rarely)   Paroxysmal atrial fibrillation (HCC)     Family History  Problem Relation Age of Onset   CAD Mother    Heart disease Mother    Pancreatic cancer Father     Social History   Socioeconomic History   Marital status: Divorced    Spouse name: Not on file   Number of children: Not on file   Years of education: Not on file   Highest education level: Not on file  Occupational History   Occupation: realtor  Tobacco Use   Smoking status: Never   Smokeless tobacco: Never   Tobacco comments:    Never smoke 12/22/21  Vaping Use   Vaping Use: Never used   Substance and Sexual Activity   Alcohol use: Yes    Alcohol/week: 1.0 standard drink of alcohol    Types: 1 Glasses of wine per week   Drug use: Never   Sexual activity: Not on file  Other Topics Concern   Not on file  Social History Narrative   Lives in [place]    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   Social Determinants of Health   Financial Resource Strain: Not on file  Food Insecurity: No Food Insecurity (11/18/2021)   Hunger Vital Sign    Worried About Running Out of Food in the Last Year: Never true    Ran Out of Food in the Last Year: Never true  Transportation Needs: No Transportation Needs (11/18/2021)   PRAPARE - Administrator, Civil Service (Medical): No    Lack of Transportation (Non-Medical): No  Physical Activity: Not on file  Stress: Not on file  Social Connections: Not on file  Intimate Partner Violence: Not At Risk (11/18/2021)   Humiliation, Afraid, Rape, and Kick questionnaire    Fear of Current or Ex-Partner: No    Emotionally Abused: No    Physically Abused: No    Sexually Abused: No    Past Surgical History:  Procedure Laterality Date   ATRIAL  FIBRILLATION ABLATION N/A 03/25/2020   Procedure: ATRIAL FIBRILLATION ABLATION;  Surgeon: Thompson Grayer, MD;  Location: [place] [facility];  Service: Cardiovascular;  Laterality: N/A;   AUGMENTATION MAMMAPLASTY  2003   BREAST BIOPSY Right 2005   benign   COLONOSCOPY     DILATATION & CURETTAGE/HYSTEROSCOPY WITH MYOSURE N/A 01/02/2018   Procedure: [place], polypectomy;  Surgeon: Benjaman Kindler, MD;  Location: [facility] OR[facility];  Service: Gynecology;  Laterality: N/A;   ESOPHAGOGASTRODUODENOSCOPY     KNEE ARTHROSCOPY Left 12/02/2017   Procedure: ARTHROSCOPY KNEE WITH PARTIAL MEDIAL MENISECTOMY, REMOVAL OF LOOSE BODY;  Surgeon: Leim Fabry, MD;  Location: [facility] OR[facility];  Service: Orthopedics;  Laterality: Left;   REDUCTION MAMMAPLASTY  2007    TUMMY TUCK  2006     (Not in a hospital admission)   Physical Exam: Blood pressure (!) 132/93, pulse (!) 102, temperature 97.9 F (36.6 C), temperature source Oral, resp. rate 16, height 5' 5.5" (1.664 m), weight 98.9 kg, last menstrual period 08/09/2011, SpO2 99 %.    Affect appropriate Healthy:  appears stated age 61: normal Neck supple with no adenopathy JVP normal no bruits no thyromegaly Lungs clear with no wheezing and good diaphragmatic motion Heart:  S1/S2 no murmur, no rub, gallop or click PMI normal Abdomen: benighn, BS positve, no tenderness, no AAA no bruit.  No HSM or HJR Distal pulses intact with no bruits No edema Neuro non-focal Skin warm and dry No muscular weakness  No current facility-administered medications on file prior to encounter.   Current Outpatient Medications on File Prior to Encounter  Medication Sig Dispense Refill   acetaminophen (TYLENOL) 500 MG tablet Take 500 mg by mouth as needed for mild pain, moderate pain or headache.     ALPRAZolam (XANAX) 0.5 MG tablet Take 0.5-1 tablets (0.25-0.5 mg total) by mouth at bedtime as needed for anxiety. 30 tablet 0   MAGNESIUM PO Take 400 mg by mouth every evening.     metoprolol tartrate (LOPRESSOR) 25 MG tablet Take 1/2 tablet (12.5 mg total) by mouth 2 (two) times daily. 30 tablet 2   Multiple Vitamins-Minerals (MULTIVITAMIN WITH MINERALS) tablet Take 2 tablets by mouth daily.     pantoprazole (PROTONIX) 40 MG tablet Take 40 mg by mouth daily as needed (Fro stomach acid).      Labs:   Lab Results  Component Value Date   WBC 10.8 (H) 01/20/2022   HGB 14.0 01/20/2022   HCT 44.2 01/20/2022   MCV 92.7 01/20/2022   PLT 384 01/20/2022    Recent Labs  Lab 01/20/22 1720  NA 137  K 4.5  CL 104  CO2 23  BUN 16  CREATININE 0.93  CALCIUM 8.9  GLUCOSE 103*   Lab Results  Component Value Date   TROPONINI <0.03 02/18/2018     Lab Results  Component Value Date   CHOL 172 11/19/2021   CHOL  220 (H) 02/07/2018   Lab Results  Component Value Date   HDL 38 (L) 11/19/2021   HDL 59 02/07/2018   Lab Results  Component Value Date   LDLCALC 115 (H) 11/19/2021   LDLCALC 136 (H) 02/07/2018   Lab Results  Component Value Date   TRIG 94 11/19/2021   TRIG 123 02/07/2018   Lab Results  Component Value Date   CHOLHDL 4.5 11/19/2021   No results found for: "LDLDIRECT"     Radiology: No results found.  EKG: Rapid afib rate 180 otherwise normal  ASSESSMENT AND  PLAN:  PAF:  Start iv cardizem for rate control. Given prior conversion with iv cardizem will start heparin tonight to cover acutely and likely d/c with eliquis until ablation. Given lack of structural heart dx and calcium score of 0 can consider flecainide as AAT as a bridge to ablation. She has been in afib > 48 hours so would not use AAT now. Update TTE in am once slowed done Dr Quentin Ore and EP service to see in am  Anxiety:  PRN xanax GERD:  protonix low carb diet OSA: not compliant with CPAP Deferred Inspiris device Seems a bit motivated to lose weight and walk more    Signed: Collier Salina Nishan12/27/2023, 6:50 PM

## 2022-01-20 NOTE — ED Provider Notes (Signed)
[facility] EMERGENCY DEPARTMENT Provider Note   CSN: [id number] Arrival date & time: 01/20/22  1538     History  Chief Complaint  Patient presents with   Atrial Fibrillation   Shortness of Breath   Dizziness    Tracey Lin is a 61 y.o. female.   Atrial Fibrillation Associated symptoms include shortness of breath.  Shortness of Breath Dizziness Associated symptoms: shortness of breath   Patient presents for dizziness, lightheadedness, and shortness of breath.  She has a history of paroxysmal atrial fibrillation.  She has undergone ablations and multiple cardioversions in the past.  She has an ablation scheduled for March of next year.  She is followed by [facility] (Dr. Lalla Brothers).  Other medical history includes anemia, anxiety, OSA, GERD.  Patient reports that 42 hours ago, around midnight of Christmas night, she began to feel symptoms.  She has had episodes in the past that resolved spontaneously.  When symptoms persisted into today, she did reach out to her cardiology office who advised her to come to the ED.  Patient is currently not on anticoagulation.  She takes metoprolol.  She currently has ongoing shortness of breath.  She has a little bit of epigastric discomfort but denies any chest pain.  She denies palpitations.     Home Medications Prior to Admission medications   Medication Sig Start Date End Date Taking? Authorizing Provider  acetaminophen (TYLENOL) 500 MG tablet Take 500 mg by mouth as needed for mild pain, moderate pain or headache.    [provider]  ALPRAZolam Prudy Feeler) 0.5 MG tablet Take 0.5-1 tablets (0.25-0.5 mg total) by mouth at bedtime as needed for anxiety. 08/15/18   Dohmeier, Porfirio Mylar, MD  MAGNESIUM PO Take 400 mg by mouth every evening.    [provider]  metoprolol tartrate (LOPRESSOR) 25 MG tablet Take 1/2 tablet (12.5 mg total) by mouth 2 (two) times daily. 12/14/21   Newman Nip, NP  Multiple Vitamins-Minerals  (MULTIVITAMIN WITH MINERALS) tablet Take 2 tablets by mouth daily.    [provider]  pantoprazole (PROTONIX) 40 MG tablet Take 40 mg by mouth daily as needed (Fro stomach acid). 05/13/21   [provider]      Allergies    Codeine and Ivp dye [iodinated contrast media]    Review of Systems   Review of Systems  Respiratory:  Positive for shortness of breath.   Neurological:  Positive for dizziness and light-headedness.  All other systems reviewed and are negative.   Physical Exam Updated Vital Signs BP (!) 132/93   Pulse (!) 102   Temp 97.9 F (36.6 C) (Oral)   Resp 16   Ht 5' 5.5" (1.664 m)   Wt 98.9 kg   LMP 08/09/2011 (Approximate) Comment: PT STILL SPOTS AS OF 08-08-17  SpO2 99%   BMI 35.73 kg/m  Physical Exam Vitals and nursing note reviewed.  Constitutional:      General: She is not in acute distress.    Appearance: She is well-developed. She is not ill-appearing, toxic-appearing or diaphoretic.  HENT:     Head: Normocephalic and atraumatic.     Mouth/Throat:     Mouth: Mucous membranes are moist.  Eyes:     Conjunctiva/sclera: Conjunctivae normal.  Cardiovascular:     Rate and Rhythm: Tachycardia present. Rhythm irregular.     Heart sounds: No murmur heard. Pulmonary:     Effort: Pulmonary effort is normal. No tachypnea, accessory muscle usage or respiratory distress.  Breath sounds: Normal breath sounds.  Abdominal:     Palpations: Abdomen is soft.     Tenderness: There is no abdominal tenderness.  Musculoskeletal:        General: No swelling.     Cervical back: Normal range of motion and neck supple.     Right lower leg: No edema.     Left lower leg: No edema.  Skin:    General: Skin is warm and dry.     Capillary Refill: Capillary refill takes less than 2 seconds.     Coloration: Skin is not cyanotic or pale.  Neurological:     General: No focal deficit present.     Mental Status: She is alert and oriented to person, place, and  time.  Psychiatric:        Mood and Affect: Mood normal.        Behavior: Behavior normal.     ED Results / Procedures / Treatments   Labs (all labs ordered are listed, but only abnormal results are displayed) Labs Reviewed  BASIC METABOLIC PANEL - Abnormal; Notable for the following components:      Result Value   Glucose, Bld 103 (*)    All other components within normal limits  CBC WITH DIFFERENTIAL/PLATELET - Abnormal; Notable for the following components:   WBC 10.8 (*)    Lymphs Abs 4.5 (*)    All other components within normal limits  MAGNESIUM  TSH  HIV ANTIBODY (ROUTINE TESTING W REFLEX)  BASIC METABOLIC PANEL  CBC  TROPONIN I (HIGH SENSITIVITY)    EKG EKG Interpretation  Date/Time:  Wednesday January 20 2022 17:16:09 EST Ventricular Rate:  167 PR Interval:    QRS Duration: 74 QT Interval:  266 QTC Calculation: 443 R Axis:   -15 Text Interpretation: Atrial fibrillation with rapid ventricular response ST & T wave abnormality, consider lateral ischemia Abnormal ECG Confirmed by Gloris Manchester ([id number]) on 01/20/2022 6:45:31 PM  Radiology No results found.  Procedures Procedures    Medications Ordered in ED Medications  diltiazem (CARDIZEM) 1 mg/mL load via infusion 10 mg (has no administration in time range)    And  diltiazem (CARDIZEM) 125 mg in dextrose 5% 125 mL (1 mg/mL) infusion (has no administration in time range)  acetaminophen (TYLENOL) tablet 650 mg (has no administration in time range)  metoprolol tartrate (LOPRESSOR) tablet 12.5 mg (has no administration in time range)  pantoprazole (PROTONIX) EC tablet 40 mg (has no administration in time range)    ED Course/ Medical Decision Making/ A&P                           Medical Decision Making Amount and/or Complexity of Data Reviewed Labs: ordered. Radiology: ordered.  Risk Prescription drug management.   This patient presents to the ED for concern of shortness of breath, this involves an  extensive number of treatment options, and is a complaint that carries with it a high risk of complications and morbidity.  The differential diagnosis includes atrial fibrillation, URI, pneumonia, CHF   Co morbidities that complicate the patient evaluation  Atrial fibrillation, anemia, anxiety, OSA, GERD   Additional history obtained:  Additional history obtained from N/A External records from outside source obtained and reviewed including EMR   Lab Tests:  I Ordered, and personally interpreted labs.  The pertinent results include: Normal electrolytes, normal kidney function, normal hemoglobin, slight leukocytosis.   Imaging Studies ordered:  I ordered imaging studies  including chest x-ray I independently visualized and interpreted imaging which showed no acute findings I agree with the radiologist interpretation   Cardiac Monitoring: / EKG:  The patient was maintained on a cardiac monitor.  I personally viewed and interpreted the cardiac monitored which showed an underlying rhythm of: Atrial fibrillation   Consultations Obtained:  I requested consultation with the cardiologist, Dr. Clifton James,  and discussed lab and imaging findings as well as pertinent plan - they recommend: No cardioversion, cardiology will admit to their service   Problem List / ED Course / Critical interventions / Medication management  Patient is a pleasant 61 year old female presenting for shortness of breath, lightheadedness for the past 42 hours.  Symptoms are reminiscent of prior episodes of atrial fibrillation.  She has a long history of atrial fibrillation and has undergone ablations and cardioversions in the past.  She is not on anticoagulation.  Patient was last seen in the ED in October.  She was admitted and converted to the following morning on diltiazem gtt.  She states that she will occasionally have brief episodes that resolve on their own.  For this reason, she did not seek medical attention  until today.  On arrival in the ED, patient is tachycardic in the range of 170.  EKG shows atrial fibrillation with RVR.  She is normotensive.  She is well-appearing on exam.  Her breathing is unlabored.  SpO2 is normal on room air.  Pads were placed on patient.  She was set up on bedside cardiac monitor.  Lab work, obtained prior to patient being bedded in the ED, showed normal electrolytes, normal hemoglobin, and normal troponin.  I spoke with cardiologist on-call, Dr. Clifton James, who advises against cardioversion given that patient is not currently on anticoagulation.  He does recommend medical management.  Diltiazem and heparin gtt.'s were ordered.  Patient was admitted to cardiology service for further management. I ordered medication including diltiazem for rate control; heparin for atrial fibrillation Reevaluation of the patient after these medicines showed that the patient improved I have reviewed the patients home medicines and have made adjustments as needed   Social Determinants of Health:  Has access to outpatient care  CRITICAL CARE Performed by: Gloris Manchester   Total critical care time: 32 minutes  Critical care time was exclusive of separately billable procedures and treating other patients.  Critical care was necessary to treat or prevent imminent or life-threatening deterioration.  Critical care was time spent personally by me on the following activities: development of treatment plan with patient and/or surrogate as well as nursing, discussions with consultants, evaluation of patient's response to treatment, examination of patient, obtaining history from patient or surrogate, ordering and performing treatments and interventions, ordering and review of laboratory studies, ordering and review of radiographic studies, pulse oximetry and re-evaluation of patient's condition.          Final Clinical Impression(s) / ED Diagnoses Final diagnoses:  Atrial fibrillation with RVR  [facility])    Rx / DC Orders ED Discharge Orders          Ordered    Amb referral to AFIB Clinic        01/20/22 1842              Gloris Manchester, MD 01/20/22 1846

## 2022-01-20 NOTE — ED Notes (Signed)
Patient BP taken manually to ensure accuracy as automatic readings have varied. Patient BP found to be 114/72, and new automatic cuff placed to ensure accurate reading.

## 2022-01-20 NOTE — ED Provider Triage Note (Signed)
Emergency Medicine Provider Triage Evaluation Note  Tracey Lin , a 61 y.o. female  was evaluated in triage.  Pt complains of being in Afib with shortness of breath and dizziness.  She denies syncope.  Pt got in contact with Afib clinic and was told to come to ED for evaluation.  Denies recent illness, nausea, vomiting, diarrhea, fever, diaphoresis.   Review of Systems  Positive: As above Negative: As above   Physical Exam  BP 125/87 (BP Location: Left Arm)   Pulse (!) 152   Temp 97.9 F (36.6 C) (Oral)   Resp 17   LMP 08/09/2011 (Approximate) Comment: PT STILL SPOTS AS OF 08-08-17  SpO2 99%  Gen:   Awake, no distress   Resp:  Normal effort  MSK:   Moves extremities without difficulty  Other:  Tachycardic with irregularly irregular rhythm consistent with Afib RVR  Medical Decision Making  Medically screening exam initiated at 5:15 PM.  Appropriate orders placed.  Tracey Lin was informed that the remainder of the evaluation will be completed by another provider, this initial triage assessment does not replace that evaluation, and the importance of remaining in the ED until their evaluation is complete.     Tracey Lin, [place] 01/20/22 1718

## 2022-01-20 NOTE — Telephone Encounter (Addendum)
Patient called in stating she went into afib Christmas Day around midnight. Felt so weak yesterday did not get out of bed. Today continues to feel weak her EKG monitor at home is reading AFIB heart rates in the 140-170s. BP 108/62. Patient is currently not on Eliquis - per Rudi Coco NP pt should proceed to ER for urgent cardioversion due to 48 hour window without Eliquis. Pt hesitant to go to ER but with further education on limited time frame due to treatment/not on eliquis currently but agreed to proceed to ER.

## 2022-01-20 NOTE — Progress Notes (Signed)
Pt refused CPAP for night time use, pt states she does not wear at home.

## 2022-01-20 NOTE — ED Triage Notes (Signed)
Pt c/o dizziness, sob, and rapid heart rate since last night. Denies cp at this time. Reports hx of afib. Pt is AxOx4.

## 2022-01-21 ENCOUNTER — Observation Stay (HOSPITAL_BASED_OUTPATIENT_CLINIC_OR_DEPARTMENT_OTHER): Payer: 59

## 2022-01-21 ENCOUNTER — Other Ambulatory Visit (HOSPITAL_COMMUNITY): Payer: Self-pay

## 2022-01-21 DIAGNOSIS — I4891 Unspecified atrial fibrillation: Secondary | ICD-10-CM

## 2022-01-21 DIAGNOSIS — I480 Paroxysmal atrial fibrillation: Secondary | ICD-10-CM

## 2022-01-21 LAB — BASIC METABOLIC PANEL
Anion gap: 6 (ref 5–15)
BUN: 12 mg/dL (ref 6–20)
CO2: 27 mmol/L (ref 22–32)
Calcium: 8.7 mg/dL — ABNORMAL LOW (ref 8.9–10.3)
Chloride: 108 mmol/L (ref 98–111)
Creatinine, Ser: 0.85 mg/dL (ref 0.44–1.00)
GFR, Estimated: >60 mL/min (ref >60)
Glucose, Bld: 100 mg/dL — ABNORMAL HIGH (ref 70–99)
Potassium: 4.3 mmol/L (ref 3.5–5.1)
Sodium: 141 mmol/L (ref 135–145)

## 2022-01-21 LAB — CBC
HCT: 39.4 % (ref 36.0–46.0)
Hemoglobin: 12.9 g/dL (ref 12.0–15.0)
MCH: 30.1 pg (ref 26.0–34.0)
MCHC: 32.7 g/dL (ref 30.0–36.0)
MCV: 91.8 fL (ref 80.0–100.0)
Platelets: 284 10*3/uL (ref 150–400)
RBC: 4.29 MIL/uL (ref 3.87–5.11)
RDW: 12.8 % (ref 11.5–15.5)
WBC: 7.6 10*3/uL (ref 4.0–10.5)
nRBC: 0 % (ref 0.0–0.2)

## 2022-01-21 LAB — ECHOCARDIOGRAM COMPLETE
Area-P 1/2: 3.56 cm2
Height: 65.5 in
S' Lateral: 2.9 cm
Weight: 3488 oz

## 2022-01-21 LAB — HEPARIN LEVEL (UNFRACTIONATED): Heparin Unfractionated: 0.42 IU/mL (ref 0.30–0.70)

## 2022-01-21 MED ORDER — PERFLUTREN LIPID MICROSPHERE
1.0000 mL | INTRAVENOUS | Status: DC | PRN
  Administered 2022-01-21: 4 mL via INTRAVENOUS

## 2022-01-21 MED ORDER — APIXABAN 5 MG PO TABS
5.0000 mg | ORAL_TABLET | Freq: Once | ORAL | Status: AC
  Administered 2022-01-21: 5 mg via ORAL
  Filled 2022-01-21: qty 1

## 2022-01-21 MED ORDER — FLECAINIDE ACETATE 50 MG PO TABS
75.0000 mg | ORAL_TABLET | Freq: Two times a day (BID) | ORAL | 3 refills | Status: DC
  Filled 2022-01-21: qty 90, 30d supply, fill #0

## 2022-01-21 MED ORDER — APIXABAN 5 MG PO TABS
5.0000 mg | ORAL_TABLET | Freq: Two times a day (BID) | ORAL | 6 refills | Status: DC
  Filled 2022-01-21: qty 60, 30d supply, fill #0

## 2022-01-21 NOTE — ED Notes (Signed)
MD notified she had converted. EKG in system. Titrate down on cardiazem and leave on heparin.

## 2022-01-21 NOTE — Discharge Summary (Signed)
Please see consult note for discharge summary.  Francis Dowse, PA-C

## 2022-01-21 NOTE — Progress Notes (Signed)
Echocardiogram 2D Echocardiogram has been performed.  Warren Lacy Esequiel Kleinfelter RDCS 01/21/2022, 11:08 AM

## 2022-01-21 NOTE — Progress Notes (Signed)
ANTICOAGULATION CONSULT NOTE  Pharmacy Consult for heparin Indication: atrial fibrillation  Allergies  Allergen Reactions   Codeine Nausea And Vomiting   Ivp Dye [Iodinated Contrast Media] Itching    Patient Measurements: Height: 5' 5.5" (166.4 cm) Weight: 98.9 kg (218 lb) IBW/kg (Calculated) : 58.15 Heparin Dosing Weight: 80.5kg  Vital Signs: Temp: 97.8 F (36.6 C) (12/28 0624) Temp Source: Oral (12/28 0624) BP: 108/74 (12/28 0630) Pulse Rate: 70 (12/28 0630)  Labs: Recent Labs    01/20/22 1720 01/21/22 0800  HGB 14.0 12.9  HCT 44.2 39.4  PLT 384 284  HEPARINUNFRC  --  0.42  CREATININE 0.93 0.85  TROPONINIHS 17  --      Estimated Creatinine Clearance: 82.8 mL/min (by C-G formula based on SCr of 0.85 mg/dL).   Medical History: Past Medical History:  Diagnosis Date   Anxiety    B12 deficiency    Difficult intubation    "SMALL TRACHEA"   GERD (gastroesophageal reflux disease)    History of kidney stones    H/O   OSA (obstructive sleep apnea)    USES CPAP ( rarely)   Paroxysmal atrial fibrillation (HCC)     Assessment: 61 YOF presenting with SOB and palpitations, hx of afib and has been on Eliquis in past, she states she has not taken this in about a month. Pharmacy consulted to dose heparin.  Heparin level 0.42 units/mL which is therapeutic while on heparin 1150 units/hr. CBC stable with no signs of bleeding.  Goal of Therapy:  Heparin level 0.3-0.7 units/ml Monitor platelets by anticoagulation protocol: Yes   Plan:  Continue heparin 1150 units/hr Check confirmatory heparin level at 1700 Daily heparin level and CBC F/u cards plans and transition to DOAC  Eldridge Scot, PharmD Clinical Pharmacist 01/21/2022 9:16 AM

## 2022-01-21 NOTE — ED Notes (Signed)
Cardiazem titrated down to 7.5

## 2022-01-21 NOTE — Consult Note (Addendum)
Cardiology Consultation   Patient ID: Tracey Lin MRN: [id number]; DOB: [birth year]  Admit date: 01/20/2022 Date of Consult: 01/21/2022  PCP:  Marinda Elk, [place] Providers Cardiologist:  Roderic Palau, NP  Electrophysiologist:  Vickie Epley, MD  {   Patient Profile:   Tracey Lin is a 61 y.o. female with a hx of  GERD, OSA,, and paroxysmal Afib, AVNRT  who is being seen 01/21/2022 for the evaluation of AFib w/RVR at the request of Dr. Johnsie Cancel.   AFib Hx initial diagnosis 15 + year, long hiatus without until 07/2017 AAD/management hx 07/2017, Multaq and Ranexa  >>> stopped 07/2017 given rare exacerbations PVI ablation 03/25/20  History of Present Illness:   Ms. Agro has had an up-tick in her AFib burden, she is quite symptomatic with he Afib which tends to be very fast. She saw Dr. Quentin Ore 01/13/22 (previously a patinet of Dr. [name]), and planned for EPS/ablation (scheduled for March).  She came to the ER yesterday with recurrent Afib, RVR, makes her feel weak, with persistent palpitations for a couple days came in. She was started on heparin and diltiazem gtts and again did convert to SR overnight.  She again feels well, back in SR, sitting up eating. No CP, no syncope  LABS K+ 4.5 > 4.3 Mag 2.0 BUN/Creat 12/0.85 WBC 7.6 H/H 12/39 Plts 284 TSH 3.649  Past Medical History:  Diagnosis Date   Anxiety    B12 deficiency    Difficult intubation    "SMALL TRACHEA"   GERD (gastroesophageal reflux disease)    History of kidney stones    H/O   OSA (obstructive sleep apnea)    USES CPAP ( rarely)   Paroxysmal atrial fibrillation ([place])     Past Surgical History:  Procedure Laterality Date   ATRIAL FIBRILLATION ABLATION N/A 03/25/2020   Procedure: ATRIAL FIBRILLATION ABLATION;  Surgeon: Thompson Grayer, MD;  Location: [facility];  Service: Cardiovascular;  Laterality: N/A;   AUGMENTATION MAMMAPLASTY  2003   BREAST  BIOPSY Right 2005   benign   COLONOSCOPY     DILATATION & CURETTAGE/HYSTEROSCOPY WITH MYOSURE N/A 01/02/2018   Procedure: [place], polypectomy;  Surgeon: Benjaman Kindler, MD;  Location: [facility];  Service: Gynecology;  Laterality: N/A;   ESOPHAGOGASTRODUODENOSCOPY     KNEE ARTHROSCOPY Left 12/02/2017   Procedure: ARTHROSCOPY KNEE WITH PARTIAL MEDIAL MENISECTOMY, REMOVAL OF LOOSE BODY;  Surgeon: Leim Fabry, MD;  Location: [facility];  Service: Orthopedics;  Laterality: Left;   REDUCTION MAMMAPLASTY  2007   TUMMY TUCK  2006       Inpatient Medications: Scheduled Meds:  apixaban  5 mg Oral Once   metoprolol tartrate  12.5 mg Oral BID   Continuous Infusions:  diltiazem (CARDIZEM) infusion 12.5 mg/hr (01/21/22 0324)   PRN Meds: acetaminophen, ALPRAZolam, pantoprazole  Allergies:    Allergies  Allergen Reactions   Codeine Nausea And Vomiting   Ivp Dye [Iodinated Contrast Media] Itching    Social History:   Social History   Socioeconomic History   Marital status: Divorced    Spouse name: Not on file   Number of children: Not on file   Years of education: Not on file   Highest education level: Not on file  Occupational History   Occupation: realtor  Tobacco Use   Smoking status: Never   Smokeless tobacco: Never   Tobacco comments:    Never smoke 12/22/21  Vaping Use  Vaping Use: Never used  Substance and Sexual Activity   Alcohol use: Yes    Alcohol/week: 1.0 standard drink of alcohol    Types: 1 Glasses of wine per week   Drug use: Never   Sexual activity: Not on file  Other Topics Concern   Not on file  Social History Narrative   L[place] in [place]    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   Social Determinants of Health   Financial Resource Strain: Not on file  Food Insecurity: No Food Insecurity (11/18/2021)   Hunger Vital Sign    Worried About Running Out of Food in the Last Year: Never  true    Ran Out of Food in the Last Year: Never true  Transportation Needs: No Transportation Needs (11/18/2021)   PRAPARE - Administrator, Civil Service (Medical): No    Lack of Transportation (Non-Medical): No  Physical Activity: Not on file  Stress: Not on file  Social Connections: Not on file  Intimate Partner Violence: Not At Risk (11/18/2021)   Humiliation, Afraid, Rape, and Kick questionnaire    Fear of Current or Ex-Partner: No    Emotionally Abused: No    Physically Abused: No    Sexually Abused: No    Family History:   Family History  Problem Relation Age of Onset   CAD Mother    Heart disease Mother    Pancreatic cancer Father      ROS:  Please see the history of present illness.  All other ROS reviewed and negative.     Physical Exam/Data:   Vitals:   01/21/22 0530 01/21/22 0600 01/21/22 0624 01/21/22 0630  BP: 106/64 106/66  108/74  Pulse: (!) 57 66  70  Resp: 17 17  14   Temp:   97.8 F (36.6 C)   TempSrc:   Oral   SpO2: 98% 97%  94%  Weight:      Height:        Intake/Output Summary (Last 24 hours) at 01/21/2022 1045 Last data filed at 01/20/2022 2019 Gross per 24 hour  Intake 12.21 ml  Output --  Net 12.21 ml      01/20/2022    5:23 PM 01/13/2022   11:00 AM 12/22/2021    3:23 PM  Last 3 Weights  Weight (lbs) 218 lb 219 lb 6 oz 221 lb  Weight (kg) 98.884 kg 99.508 kg 100.245 kg     Body mass index is 35.73 kg/m.  General:  Well nourished, well developed, in no acute distress HEENT: normal Neck: no JVD Vascular: No carotid bruits; Distal pulses 2+ bilaterally Cardiac:  RRR; no murmurs, gallops or rubs Lungs: CTA b/l, no wheezing, rhonchi or rales  Abd: soft, nontender Ext: no edema Musculoskeletal:  No deformities Skin: warm and dry  Neuro:  no focal abnormalities noted Psych:  Normal affect   EKG:  The EKG was personally reviewed and demonstrates:   AFib 167bpm, SR 65bpm, normal intervals  Telemetry:  Telemetry  was personally reviewed and demonstrates:   AFib 170's >> SR 70's  Relevant CV Studies:  03/25/20: EPS/ablation CONCLUSIONS: 1. Sinus rhythm upon presentation.   2. Intracardiac echo reveals a mildly enlarged sized left atrium with four separate pulmonary veins without evidence of pulmonary vein stenosis. 3. Successful electrical isolation and anatomical encircling of all four pulmonary veins with radiofrequency current. 4. 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  This was not  ablated today as this has not been a clinical arrhythmia for her. 5. No early apparent complications.  03/18/20: Cardiac CT IMPRESSION: 1.  Mild bi atrial enlargement no LAA thrombus 2.  No ASD/PFO 3.  Normal PV anatomy with no anomaly measurements above 4.  Calcium score 0 normal right dominant coronary arteries 5.  No pericardial effusion 6.  Normal aortic root 3.1 cm   01/30/20: TTE 1. Poor quality images due to breast implants.   2. Left ventricular ejection fraction, by estimation, is 60 to 65%. The  left ventricle has normal function. The left ventricle has no regional  wall motion abnormalities. Left ventricular diastolic parameters were  normal.   3. Right ventricular systolic function is normal. The right ventricular  size is normal.   4. Left atrial size was mildly dilated.   5. The mitral valve is normal in structure. Trivial mitral valve  regurgitation. No evidence of mitral stenosis.   6. The aortic valve was not well visualized. Aortic valve regurgitation  is not visualized. Mild to moderate aortic valve sclerosis/calcification  is present, without any evidence of aortic stenosis.   7. The inferior vena cava is normal in size with greater than 50%  respiratory variability, suggesting right atrial pressure of 3 mmHg.   Laboratory Data:  High Sensitivity Troponin:   Recent Labs  Lab 01/20/22 1720  TROPONINIHS 17     Chemistry Recent Labs  Lab 01/20/22 1720  01/20/22 1956 01/21/22 0800  NA 137  --  141  K 4.5  --  4.3  CL 104  --  108  CO2 23  --  27  GLUCOSE 103*  --  100*  BUN 16  --  12  CREATININE 0.93  --  0.85  CALCIUM 8.9  --  8.7*  MG  --  2.0  --   GFRNONAA >60  --  >60  ANIONGAP 10  --  6    No results for input(s): "PROT", "ALBUMIN", "AST", "ALT", "ALKPHOS", "BILITOT" in the last 168 hours. Lipids No results for input(s): "CHOL", "TRIG", "HDL", "LABVLDL", "LDLCALC", "CHOLHDL" in the last 168 hours.  Hematology Recent Labs  Lab 01/20/22 1720 01/21/22 0800  WBC 10.8* 7.6  RBC 4.77 4.29  HGB 14.0 12.9  HCT 44.2 39.4  MCV 92.7 91.8  MCH 29.4 30.1  MCHC 31.7 32.7  RDW 12.7 12.8  PLT 384 284   Thyroid  Recent Labs  Lab 01/20/22 1956  TSH 3.649    BNPNo results for input(s): "BNP", "PROBNP" in the last 168 hours.  DDimer No results for input(s): "DDIMER" in the last 168 hours.   Radiology/Studies:  DG Chest Portable 1 View  Result Date: 01/20/2022 CLINICAL DATA:  Atrial fibrillation. Shortness of breath and dizziness. EXAM: PORTABLE CHEST 1 VIEW COMPARISON:  11/18/2021 FINDINGS: The patient is rotated to the right on today's radiograph, reducing diagnostic sensitivity and specificity. Cardiac and mediastinal margins appear normal. The lungs appear clear. No blunting of the costophrenic angles. No significant bony abnormality identified. IMPRESSION: 1. No active cardiopulmonary disease is radiographically apparent. Electronically Signed   By: Van Clines M.D.   On: 01/20/2022 18:52     Assessment and Plan:   Paroxysmal AFib RVR, symptomatic Spontaneous conversion (again on dilt gtt) CHA2DS2Vasc is one for gender  Dr. Quentin Ore has seen the patient No structural heart disease The patient is agreeable for AAD to bridge to her ablation procedure. Start flecainide 75mg  BID along with her metoprolol Start Eliquis 5mg   BID  Out patient EKG to f/u is scheduled Pt instructed to stop flecainide 5 days ahead of  her ablation procedure in March. I have also communicated with our EP navigator the same.  Discharge today     Risk Assessment/Risk Scores:     For questions or updates, please contact [place] Please consult [email or web address] for contact info under    Signed, Baldwin [place], PA-C  01/21/2022 10:45 AM

## 2022-01-21 NOTE — Discharge Instructions (Signed)
STOP flecainide 5 days prior to your ablation procedure.

## 2022-01-21 NOTE — ED Notes (Signed)
Patient awake and alert this morning, no s/s of distress, ambulated to the [facility] without difficulty, currently eating a breakfast, will continue to monitor.

## 2022-01-21 NOTE — ED Notes (Signed)
CARDIAZEM INCREASED TO 15mg /ml

## 2022-01-28 ENCOUNTER — Ambulatory Visit: Payer: 59 | Attending: Internal Medicine

## 2022-01-28 VITALS — BP 110/72 | HR 64 | Ht 65.0 in | Wt 218.0 lb

## 2022-01-28 DIAGNOSIS — I480 Paroxysmal atrial fibrillation: Secondary | ICD-10-CM | POA: Diagnosis not present

## 2022-01-28 NOTE — Progress Notes (Signed)
   Nurse Visit   Date of Encounter: 01/28/2022 ID: Beaulah Corin, DOB [birth year], MRN [id number]  PCP:  Marinda Elk, [place] [facility] Cardiologist:  Roderic Palau, NP Electrophysiologist:  Vickie Epley, MD      Visit Details   VS:  BP 110/72 (BP Location: Left Arm, Patient Position: Sitting, Cuff Size: Normal)   Pulse 64   Ht 5\' 5"  (1.651 m)   Wt 218 lb (98.9 kg)   LMP 08/09/2011 (Approximate) Comment: PT STILL SPOTS AS OF 08-08-17  BMI 36.28 kg/m  , BMI Body mass index is 36.28 kg/m.  Wt Readings from Last 3 Encounters:  01/28/22 218 lb (98.9 kg)  01/20/22 218 lb (98.9 kg)  01/13/22 219 lb 6 oz (99.5 kg)     Reason for visit: EKG- Flecainide Started on 01/21/22 Performed today: Vitals, EKG, Provider consulted:Renee Chacra PA, and Education Changes (medications, testing, etc.) : No changes Length of Visit: 30 minutes    Medications Adjustments/Labs and Tests Ordered: No orders of the defined types were placed in this encounter.  No orders of the defined types were placed in this encounter.    Signed, Michaelyn Barter, RN  01/28/2022 11:59 AM

## 2022-01-28 NOTE — Patient Instructions (Signed)
Medication Instructions:  Your physician recommends that you continue on your current medications as directed. Please refer to the Current Medication list given to you today.  *If you need a refill on your cardiac medications before your next appointment, please call your pharmacy*  Follow-Up: At [facility], you and your health needs are our priority.  As part of our continuing mission to provide you with exceptional heart care, we have created designated Provider Care Teams.  These Care Teams include your primary Cardiologist (physician) and Advanced Practice Providers (APPs -  Physician Assistants and Nurse Practitioners) who all work together to provide you with the care you need, when you need it.  We recommend signing up for the patient portal called "MyChart".  Sign up information is provided on this After Visit Summary.  MyChart is used to connect with patients for Virtual Visits (Telemedicine).  Patients are able to view lab/test results, encounter notes, upcoming appointments, etc.  Non-urgent messages can be sent to your provider as well.   To learn more about what you can do with MyChart, go to [email or web address].    Your next appointment:   As scheduled  The format for your next appointment:   In Person  Provider:   You may see Vickie Epley, MDor one of the following Advanced Practice Providers on your designated Care Team:   Tommye Standard, [place] Legrand Como "Jonni Sanger" Chalmers Cater, [place]     Important Information About Sugar

## 2022-02-24 ENCOUNTER — Other Ambulatory Visit (HOSPITAL_COMMUNITY): Payer: Self-pay | Admitting: *Deleted

## 2022-02-24 MED ORDER — APIXABAN 5 MG PO TABS: 5.0000 mg | ORAL_TABLET | Freq: Two times a day (BID) | ORAL | 6 refills | Status: DC

## 2022-02-24 MED ORDER — FLECAINIDE ACETATE 50 MG PO TABS: 75.0000 mg | ORAL_TABLET | Freq: Two times a day (BID) | ORAL | 3 refills | Status: DC

## 2022-03-04 ENCOUNTER — Ambulatory Visit: Payer: 59 | Admitting: Student

## 2022-03-04 ENCOUNTER — Encounter (HOSPITAL_COMMUNITY): Payer: Self-pay | Admitting: *Deleted

## 2022-03-14 ENCOUNTER — Other Ambulatory Visit (HOSPITAL_COMMUNITY): Payer: Self-pay | Admitting: Nurse Practitioner

## 2022-03-15 NOTE — Telephone Encounter (Signed)
Please assist pt with refill

## 2022-03-18 ENCOUNTER — Other Ambulatory Visit (HOSPITAL_COMMUNITY): Payer: Self-pay | Admitting: Nurse Practitioner

## 2022-03-23 NOTE — Progress Notes (Unsigned)
  Electrophysiology Office Follow up Visit Note:    Date:  03/24/2022   ID:  Tracey Lin, DOB Feb 20, 1960, MRN [id number]  PCP:  Marinda Elk, MD  [facility] Cardiologist:  Roderic Palau, NP  [facility] Electrophysiologist:  Vickie Epley, MD    Interval History:    Tracey Lin is a 62 y.o. female who presents for a follow up visit.   Last seen 01/13/2022 pAF. Prior ablation by JA in 2022. Now with recurrent symptoms. Scheduled for redo PVI 04/23/2022.  She has been taking Eliquis without missed doses.  She is tolerating without any bleeding issues.  Plan for redo PVI + EP study with possible slow pathway modification.   On the flecainide she has not had recurrent episodes.  She is taking 50 mg by mouth twice daily flecainide.     Past medical, surgical, social and family history were reviewed.  ROS:   Please see the history of present illness.    All other systems reviewed and are negative.  EKGs/Labs/Other Studies Reviewed:    The following studies were reviewed today:    Physical Exam:    VS:  BP 132/76   Pulse 82   Ht 5' 5"$  (1.651 m)   Wt 218 lb (98.9 kg)   LMP 08/09/2011 (Approximate) Comment: PT STILL SPOTS AS OF 08-08-17  SpO2 97%   BMI 36.28 kg/m     Wt Readings from Last 3 Encounters:  03/24/22 218 lb (98.9 kg)  01/28/22 218 lb (98.9 kg)  01/20/22 218 lb (98.9 kg)     GEN:  Well nourished, well developed in no acute distress CARDIAC: RRR, no murmurs, rubs, gallops RESPIRATORY:  Clear to auscultation without rales, wheezing or rhonchi       ASSESSMENT:    1. Paroxysmal atrial fibrillation (HCC)   2. Morbid obesity ([place])    PLAN:    In order of problems listed above:  #pAF S/p prior ablation by Dr [name] now with recurrent symptoms. Plan for redo PVI next month.  She is taking Eliquis.  Discussed treatment options today for their AF/SVT including antiarrhythmic drug therapy and ablation. Discussed risks, recovery  and likelihood of success. Discussed potential need for repeat ablation procedures and antiarrhythmic drugs after an initial ablation. They wish to proceed with scheduling.  Risk, benefits, and alternatives to EP study and radiofrequency ablation for afib/SVT were also discussed in detail today. These risks include but are not limited to stroke, bleeding, vascular damage, tamponade, perforation, damage to the esophagus, lungs, and other structures, pulmonary vein stenosis, worsening renal function, AV block requiring pacemaker, and death. The patient understands these risk and wishes to proceed.  We will therefore proceed with catheter ablation at the next available time.  Carto, ICE, anesthesia are requested for the procedure.  Will also obtain CT PV protocol prior to the procedure to exclude LAA thrombus and further evaluate atrial anatomy.    Ablation strategy will be redo PVI + EP study + ? Ablation of AVNRT.  She will hold her metoprolol and flecainide for 5 days prior to the procedure.    Signed, Lars Mage, MD, [facility], [facility] 03/24/2022 10:02 AM    Electrophysiology [facility]

## 2022-03-23 NOTE — H&P (View-Only) (Signed)
  Electrophysiology Office Follow up Visit Note:    Date:  03/24/2022   ID:  Tracey Lin, DOB 03/01/1960, MRN [id number]  PCP:  McLaughlin, Miriam K, MD  [facility] Cardiologist:  Donna Carroll, NP  [facility] Electrophysiologist:  Charlie Seda T Samaia Iwata, MD    Interval History:    Tracey Lin is a 61 y.o. female who presents for a follow up visit.   Last seen 01/13/2022 pAF. Prior ablation by JA in 2022. Now with recurrent symptoms. Scheduled for redo PVI 04/23/2022.  She has been taking Eliquis without missed doses.  She is tolerating without any bleeding issues.  Plan for redo PVI + EP study with possible slow pathway modification.   On the flecainide she has not had recurrent episodes.  She is taking 50 mg by mouth twice daily flecainide.     Past medical, surgical, social and family history were reviewed.  ROS:   Please see the history of present illness.    All other systems reviewed and are negative.  EKGs/Labs/Other Studies Reviewed:    The following studies were reviewed today:    Physical Exam:    VS:  BP 132/76   Pulse 82   Ht 5' 5" (1.651 m)   Wt 218 lb (98.9 kg)   LMP 08/09/2011 (Approximate) Comment: PT STILL SPOTS AS OF 08-08-17  SpO2 97%   BMI 36.28 kg/m     Wt Readings from Last 3 Encounters:  03/24/22 218 lb (98.9 kg)  01/28/22 218 lb (98.9 kg)  01/20/22 218 lb (98.9 kg)     GEN:  Well nourished, well developed in no acute distress CARDIAC: RRR, no murmurs, rubs, gallops RESPIRATORY:  Clear to auscultation without rales, wheezing or rhonchi       ASSESSMENT:    1. Paroxysmal atrial fibrillation (HCC)   2. Morbid obesity (HCC)    PLAN:    In order of problems listed above:  #pAF S/p prior ablation by Dr Allred now with recurrent symptoms. Plan for redo PVI next month.  She is taking Eliquis.  Discussed treatment options today for their AF/SVT including antiarrhythmic drug therapy and ablation. Discussed risks, recovery  and likelihood of success. Discussed potential need for repeat ablation procedures and antiarrhythmic drugs after an initial ablation. They wish to proceed with scheduling.  Risk, benefits, and alternatives to EP study and radiofrequency ablation for afib/SVT were also discussed in detail today. These risks include but are not limited to stroke, bleeding, vascular damage, tamponade, perforation, damage to the esophagus, lungs, and other structures, pulmonary vein stenosis, worsening renal function, AV block requiring pacemaker, and death. The patient understands these risk and wishes to proceed.  We will therefore proceed with catheter ablation at the next available time.  Carto, ICE, anesthesia are requested for the procedure.  Will also obtain CT PV protocol prior to the procedure to exclude LAA thrombus and further evaluate atrial anatomy.    Ablation strategy will be redo PVI + EP study + ? Ablation of AVNRT.  She will hold her metoprolol and flecainide for 5 days prior to the procedure.    Signed, Manjit Bufano, MD, FACC, FHRS 03/24/2022 10:02 AM    Electrophysiology [facility] 

## 2022-03-24 ENCOUNTER — Other Ambulatory Visit
Admission: RE | Admit: 2022-03-24 | Discharge: 2022-03-24 | Disposition: A | Discharge status: Home / Self Care | Payer: 59 | Source: Ambulatory Visit | Attending: Cardiology | Admitting: Cardiology

## 2022-03-24 ENCOUNTER — Ambulatory Visit: Payer: 59 | Attending: Cardiology | Admitting: Cardiology

## 2022-03-24 ENCOUNTER — Encounter: Payer: Self-pay | Admitting: Cardiology

## 2022-03-24 VITALS — BP 132/76 | HR 82 | Ht 65.0 in | Wt 218.0 lb

## 2022-03-24 DIAGNOSIS — I480 Paroxysmal atrial fibrillation: Secondary | ICD-10-CM | POA: Insufficient documentation

## 2022-03-24 LAB — CBC
HCT: 42 % (ref 36.0–46.0)
Hemoglobin: 13.3 g/dL (ref 12.0–15.0)
MCH: 28.9 pg (ref 26.0–34.0)
MCHC: 31.7 g/dL (ref 30.0–36.0)
MCV: 91.1 fL (ref 80.0–100.0)
Platelets: 338 10*3/uL (ref 150–400)
RBC: 4.61 MIL/uL (ref 3.87–5.11)
RDW: 12.7 % (ref 11.5–15.5)
WBC: 7.7 10*3/uL (ref 4.0–10.5)
nRBC: 0 % (ref 0.0–0.2)

## 2022-03-24 LAB — BASIC METABOLIC PANEL
Anion gap: 5 (ref 5–15)
BUN: 15 mg/dL (ref 6–20)
CO2: 29 mmol/L (ref 22–32)
Calcium: 9.1 mg/dL (ref 8.9–10.3)
Chloride: 104 mmol/L (ref 98–111)
Creatinine, Ser: 0.73 mg/dL (ref 0.44–1.00)
GFR, Estimated: >60 mL/min (ref >60)
Glucose, Bld: 94 mg/dL (ref 70–99)
Potassium: 4.7 mmol/L (ref 3.5–5.1)
Sodium: 138 mmol/L (ref 135–145)

## 2022-03-24 MED ORDER — DIPHENHYDRAMINE HCL 50 MG PO TABS: 50.0000 mg | ORAL_TABLET | Freq: Once | ORAL | 0 refills | Status: DC

## 2022-03-24 MED ORDER — PREDNISONE 50 MG PO TABS: ORAL_TABLET | ORAL | 0 refills | Status: DC

## 2022-03-24 NOTE — Addendum Note (Signed)
Addended by: Bernestine Amass on: 03/24/2022 10:06 AM   Modules accepted: Orders

## 2022-03-24 NOTE — Patient Instructions (Signed)
Medication Instructions:  Your physician recommends that you continue on your current medications as directed. Please refer to the Current Medication list given to you today.  *If you need a refill on your cardiac medications before your next appointment, please call your pharmacy*  Lab Work: BMET and CBC prior to CT scan  You will get your lab work at [facility]) hospital.  Your lab work will be done at the [place] next to [employer].  These are walk in labs- you will not need an appointment and you do not need to be fasting.    Testing/Procedures: Your physician has requested that you have cardiac CT. Cardiac computed tomography (CT) is a painless test that uses an x-ray machine to take clear, detailed pictures of your heart. For further information please visit [email or web address].[email or web address]. Please follow instruction sheet as given.  Your physician has recommended that you have an ablation. Catheter ablation is a medical procedure used to treat some cardiac arrhythmias (irregular heartbeats). During catheter ablation, a long, thin, flexible tube is put into a blood vessel in your groin (upper thigh), or neck. This tube is called an ablation catheter. It is then guided to your heart through the blood vessel. Radio frequency waves destroy small areas of heart tissue where abnormal heartbeats may cause an arrhythmia to start. Please see the instruction sheet given to you today.  Follow-Up: At [facility], you and your health needs are our priority.  As part of our continuing mission to provide you with exceptional heart care, we have created designated Provider Care Teams.  These Care Teams include your primary Cardiologist (physician) and Advanced Practice Providers (APPs -  Physician Assistants and Nurse Practitioners) who all work together to provide you with the care you need, when you need it.  Your next appointment:   4/26 at the [facility]

## 2022-04-15 ENCOUNTER — Telehealth (HOSPITAL_COMMUNITY): Payer: Self-pay | Admitting: *Deleted

## 2022-04-15 NOTE — Telephone Encounter (Signed)
Reaching out to patient to offer assistance regarding upcoming cardiac imaging study; pt verbalizes understanding of appt date/time, parking situation and where to check in, pre-test NPO status and medications ordered, and verified current allergies; name and call back number provided for further questions should they arise  Gordy Clement RN [place] [facility] [phone] office [phone] cell  Patient verbalized understanding of how to take 13 hour prep.

## 2022-04-16 ENCOUNTER — Ambulatory Visit
Admission: RE | Admit: 2022-04-16 | Discharge: 2022-04-16 | Disposition: A | Discharge status: Home / Self Care | Payer: 59 | Source: Ambulatory Visit | Attending: Cardiology | Admitting: Cardiology

## 2022-04-16 ENCOUNTER — Ambulatory Visit (HOSPITAL_BASED_OUTPATIENT_CLINIC_OR_DEPARTMENT_OTHER): Payer: 59

## 2022-04-16 DIAGNOSIS — I480 Paroxysmal atrial fibrillation: Secondary | ICD-10-CM | POA: Insufficient documentation

## 2022-04-16 MED ORDER — IOHEXOL 350 MG/ML SOLN
100.0000 mL | Freq: Once | INTRAVENOUS | Status: AC | PRN
  Administered 2022-04-16: 80 mL via INTRAVENOUS

## 2022-04-22 NOTE — Pre-Procedure Instructions (Signed)
Attempted to call patient regarding procedure instructions for tomorrow.  Left voicemail on the following items: New Arrival time 0730 Nothing to eat or drink after midnight No meds AM of procedure Responsible person to drive you home and stay with you for 24 hrs  Have you missed any doses of anti-coagulant Eliquis- if you have missed any doses please let us know right away.

## 2022-04-23 ENCOUNTER — Ambulatory Visit (HOSPITAL_COMMUNITY)
Admission: RE | Admit: 2022-04-23 | Discharge: 2022-04-23 | Disposition: A | Discharge status: Home / Self Care | Payer: 59 | Attending: Cardiology | Admitting: Cardiology

## 2022-04-23 ENCOUNTER — Ambulatory Visit (HOSPITAL_BASED_OUTPATIENT_CLINIC_OR_DEPARTMENT_OTHER): Payer: 59 | Admitting: Certified Registered Nurse Anesthetist

## 2022-04-23 ENCOUNTER — Encounter (HOSPITAL_COMMUNITY)
Admission: RE | Disposition: A | Discharge status: Home / Self Care | Payer: Self-pay | Source: Home / Self Care | Attending: Cardiology

## 2022-04-23 ENCOUNTER — Ambulatory Visit (HOSPITAL_COMMUNITY): Payer: 59 | Admitting: Certified Registered Nurse Anesthetist

## 2022-04-23 DIAGNOSIS — Z7901 Long term (current) use of anticoagulants: Secondary | ICD-10-CM | POA: Diagnosis not present

## 2022-04-23 DIAGNOSIS — F419 Anxiety disorder, unspecified: Secondary | ICD-10-CM | POA: Diagnosis not present

## 2022-04-23 DIAGNOSIS — I4891 Unspecified atrial fibrillation: Secondary | ICD-10-CM

## 2022-04-23 DIAGNOSIS — G4730 Sleep apnea, unspecified: Secondary | ICD-10-CM | POA: Diagnosis not present

## 2022-04-23 DIAGNOSIS — I472 Ventricular tachycardia, unspecified: Secondary | ICD-10-CM | POA: Diagnosis not present

## 2022-04-23 DIAGNOSIS — I471 Supraventricular tachycardia, unspecified: Secondary | ICD-10-CM | POA: Diagnosis not present

## 2022-04-23 DIAGNOSIS — Z79899 Other long term (current) drug therapy: Secondary | ICD-10-CM | POA: Insufficient documentation

## 2022-04-23 DIAGNOSIS — Z6836 Body mass index (BMI) 36.0-36.9, adult: Secondary | ICD-10-CM | POA: Insufficient documentation

## 2022-04-23 DIAGNOSIS — I480 Paroxysmal atrial fibrillation: Secondary | ICD-10-CM | POA: Diagnosis not present

## 2022-04-23 HISTORY — PX: ATRIAL FIBRILLATION ABLATION: EP1191

## 2022-04-23 LAB — POCT ACTIVATED CLOTTING TIME
Activated Clotting Time: 304 seconds
Activated Clotting Time: 342 seconds

## 2022-04-23 SURGERY — ATRIAL FIBRILLATION ABLATION
Anesthesia: General

## 2022-04-23 MED ORDER — SUGAMMADEX SODIUM 200 MG/2ML IV SOLN
INTRAVENOUS | Status: DC | PRN
  Administered 2022-04-23 (×2): 100 mg via INTRAVENOUS

## 2022-04-23 MED ORDER — SODIUM CHLORIDE 0.9% FLUSH: 3.0000 mL | INTRAVENOUS | Status: DC | PRN

## 2022-04-23 MED ORDER — MIDAZOLAM HCL 5 MG/5ML IJ SOLN
INTRAMUSCULAR | Status: DC | PRN
  Administered 2022-04-23 (×2): 1 mg via INTRAVENOUS

## 2022-04-23 MED ORDER — SODIUM CHLORIDE 0.9% FLUSH: 3.0000 mL | Freq: Two times a day (BID) | INTRAVENOUS | Status: DC

## 2022-04-23 MED ORDER — HEPARIN (PORCINE) IN NACL 1000-0.9 UT/500ML-% IV SOLN
INTRAVENOUS | Status: DC | PRN
  Administered 2022-04-23 (×3): 500 mL

## 2022-04-23 MED ORDER — PROTAMINE SULFATE 10 MG/ML IV SOLN
INTRAVENOUS | Status: DC | PRN
  Administered 2022-04-23 (×3): 10 mg via INTRAVENOUS

## 2022-04-23 MED ORDER — APIXABAN 5 MG PO TABS
5.0000 mg | ORAL_TABLET | Freq: Two times a day (BID) | ORAL | Status: DC
  Administered 2022-04-23: 5 mg via ORAL
  Filled 2022-04-23: qty 1

## 2022-04-23 MED ORDER — ACETAMINOPHEN 325 MG PO TABS
650.0000 mg | ORAL_TABLET | ORAL | Status: DC | PRN
  Administered 2022-04-23: 650 mg via ORAL

## 2022-04-23 MED ORDER — HEPARIN SODIUM (PORCINE) 1000 UNIT/ML IJ SOLN
INTRAMUSCULAR | Status: DC | PRN
  Administered 2022-04-23: 15000 [IU] via INTRAVENOUS

## 2022-04-23 MED ORDER — ISOPROTERENOL HCL 0.2 MG/ML IJ SOLN
INTRAMUSCULAR | Status: AC
  Filled 2022-04-23: qty 5

## 2022-04-23 MED ORDER — SODIUM CHLORIDE 0.9 % IV SOLN: INTRAVENOUS | Status: DC

## 2022-04-23 MED ORDER — FENTANYL CITRATE (PF) 100 MCG/2ML IJ SOLN
INTRAMUSCULAR | Status: DC | PRN
  Administered 2022-04-23 (×2): 50 ug via INTRAVENOUS

## 2022-04-23 MED ORDER — HEPARIN SODIUM (PORCINE) 1000 UNIT/ML IJ SOLN
INTRAMUSCULAR | Status: AC
  Filled 2022-04-23: qty 10

## 2022-04-23 MED ORDER — DEXAMETHASONE SODIUM PHOSPHATE 10 MG/ML IJ SOLN
INTRAMUSCULAR | Status: DC | PRN
  Administered 2022-04-23: 5 mg via INTRAVENOUS

## 2022-04-23 MED ORDER — LIDOCAINE 2% (20 MG/ML) 5 ML SYRINGE
INTRAMUSCULAR | Status: DC | PRN
  Administered 2022-04-23: 50 mg via INTRAVENOUS

## 2022-04-23 MED ORDER — ONDANSETRON HCL 4 MG/2ML IJ SOLN
INTRAMUSCULAR | Status: DC | PRN
  Administered 2022-04-23: 4 mg via INTRAVENOUS

## 2022-04-23 MED ORDER — PROPOFOL 10 MG/ML IV BOLUS
INTRAVENOUS | Status: DC | PRN
  Administered 2022-04-23: 20 mg via INTRAVENOUS
  Administered 2022-04-23: 50 mg via INTRAVENOUS
  Administered 2022-04-23: 130 mg via INTRAVENOUS

## 2022-04-23 MED ORDER — ONDANSETRON HCL 4 MG/2ML IJ SOLN: 4.0000 mg | Freq: Four times a day (QID) | INTRAMUSCULAR | Status: DC | PRN

## 2022-04-23 MED ORDER — PHENYLEPHRINE 80 MCG/ML (10ML) SYRINGE FOR IV PUSH (FOR BLOOD PRESSURE SUPPORT)
PREFILLED_SYRINGE | INTRAVENOUS | Status: DC | PRN
  Administered 2022-04-23: 160 ug via INTRAVENOUS

## 2022-04-23 MED ORDER — SODIUM CHLORIDE 0.9 % IV SOLN: 250.0000 mL | INTRAVENOUS | Status: DC | PRN

## 2022-04-23 MED ORDER — HEPARIN SODIUM (PORCINE) 1000 UNIT/ML IJ SOLN
INTRAMUSCULAR | Status: DC | PRN
  Administered 2022-04-23: 1000 [IU] via INTRAVENOUS

## 2022-04-23 MED ORDER — ROCURONIUM BROMIDE 10 MG/ML (PF) SYRINGE
PREFILLED_SYRINGE | INTRAVENOUS | Status: DC | PRN
  Administered 2022-04-23: 50 mg via INTRAVENOUS

## 2022-04-23 MED ORDER — PHENYLEPHRINE HCL-NACL 20-0.9 MG/250ML-% IV SOLN
INTRAVENOUS | Status: DC | PRN
  Administered 2022-04-23: 35 ug/min via INTRAVENOUS

## 2022-04-23 MED ORDER — ACETAMINOPHEN 325 MG PO TABS
ORAL_TABLET | ORAL | Status: AC
  Filled 2022-04-23: qty 2

## 2022-04-23 MED ORDER — ISOPROTERENOL HCL 0.2 MG/ML IJ SOLN
INTRAVENOUS | Status: DC | PRN
  Administered 2022-04-23: 2 ug/min via INTRAVENOUS

## 2022-04-23 SURGICAL SUPPLY — 20 items
BAG SNAP BAND KOVER 36X36 (MISCELLANEOUS) IMPLANT
CATH 8FR REPROCESSED SOUNDSTAR (CATHETERS) ×1 IMPLANT
CATH 8FR SOUNDSTAR REPROCESSED (CATHETERS) IMPLANT
CATH ABLAT QDOT MICRO BI TC DF (CATHETERS) IMPLANT
CATH JOSEPH QUAD ALLRED 6F REP (CATHETERS) IMPLANT
CATH OCTARAY 2.0 F 3-3-3-3-3 (CATHETERS) IMPLANT
CATH S-M CIRCA TEMP PROBE (CATHETERS) IMPLANT
CATH WEBSTER BI DIR CS D-F CRV (CATHETERS) IMPLANT
CLOSURE PERCLOSE PROSTYLE (VASCULAR PRODUCTS) IMPLANT
COVER SWIFTLINK CONNECTOR (BAG) ×1 IMPLANT
PACK EP LATEX FREE (CUSTOM PROCEDURE TRAY) ×1
PACK EP LF (CUSTOM PROCEDURE TRAY) ×1 IMPLANT
PAD DEFIB RADIO PHYSIO CONN (PAD) ×1 IMPLANT
PATCH CARTO3 (PAD) IMPLANT
SHEATH BAYLIS TRANSSEPTAL 98CM (NEEDLE) IMPLANT
SHEATH CARTO VIZIGO SM CVD (SHEATH) IMPLANT
SHEATH PINNACLE 8F 10CM (SHEATH) IMPLANT
SHEATH PINNACLE 9F 10CM (SHEATH) IMPLANT
SHEATH PROBE COVER 6X72 (BAG) IMPLANT
TUBING SMART ABLATE COOLFLOW (TUBING) IMPLANT

## 2022-04-23 NOTE — Anesthesia Procedure Notes (Signed)
Procedure Name: Intubation Date/Time: 04/23/2022 9:45 AM  Performed by: Janene Harvey, CRNAPre-anesthesia Checklist: Patient identified, Emergency Drugs available, Suction available and Patient being monitored Patient Re-evaluated:Patient Re-evaluated prior to induction Oxygen Delivery Method: Circle system utilized Preoxygenation: Pre-oxygenation with 100% oxygen Induction Type: IV induction Ventilation: Mask ventilation without difficulty and Oral airway inserted - appropriate to patient size Laryngoscope Size: Mac and 4 Grade View: Grade I Tube type: Oral Tube size: 7.0 mm Number of attempts: 1 Airway Equipment and Method: Stylet and Oral airway Placement Confirmation: ETT inserted through vocal cords under direct vision, positive ETCO2 and breath sounds checked- equal and bilateral Secured at: 22 cm Tube secured with: Tape Dental Injury: Teeth and Oropharynx as per pre-operative assessment

## 2022-04-23 NOTE — Discharge Instructions (Signed)

## 2022-04-23 NOTE — Anesthesia Preprocedure Evaluation (Signed)
Anesthesia Evaluation  Patient identified by MRN, date of birth, ID band Patient awake    Reviewed: Allergy & Precautions, H&P , NPO status , Patient's Chart, lab work & pertinent test results  Airway Mallampati: II   Neck ROM: full    Dental   Pulmonary sleep apnea    breath sounds clear to auscultation       Cardiovascular + dysrhythmias Atrial Fibrillation  Rhythm:irregular Rate:Normal     Neuro/Psych   Anxiety        GI/Hepatic ,GERD  ,,  Endo/Other    Renal/GU stones     Musculoskeletal   Abdominal   Peds  Hematology   Anesthesia Other Findings   Reproductive/Obstetrics                             Anesthesia Physical Anesthesia Plan  ASA: 3  Anesthesia Plan: General   Post-op Pain Management:    Induction: Intravenous  PONV Risk Score and Plan: 3 and Ondansetron, Dexamethasone, Midazolam and Treatment may vary due to age or medical condition  Airway Management Planned: Oral ETT  Additional Equipment:   Intra-op Plan:   Post-operative Plan: Extubation in OR  Informed Consent: I have reviewed the patients History and Physical, chart, labs and discussed the procedure including the risks, benefits and alternatives for the proposed anesthesia with the patient or authorized representative who has indicated his/her understanding and acceptance.     Dental advisory given  Plan Discussed with: CRNA, Anesthesiologist and Surgeon  Anesthesia Plan Comments:        Anesthesia Quick Evaluation

## 2022-04-23 NOTE — Transfer of Care (Signed)
Immediate Anesthesia Transfer of Care Note  Patient: Tracey Lin  Procedure(s) Performed: ATRIAL FIBRILLATION ABLATION  Patient Location: Cath Lab  Anesthesia Type:General  Level of Consciousness: drowsy and patient cooperative  Airway & Oxygen Therapy: Patient Spontanous Breathing and Patient connected to nasal cannula oxygen  Post-op Assessment: Report given to RN and Post -op Vital signs reviewed and stable  Post vital signs: Reviewed and stable  Last Vitals:  Vitals Value Taken Time  BP    Temp    Pulse    Resp    SpO2      Last Pain:  Vitals:   04/23/22 0812  TempSrc:   PainSc: 0-No pain         Complications: There were no known notable events for this encounter.

## 2022-04-23 NOTE — Interval H&P Note (Signed)
History and Physical Interval Note:  04/23/2022 9:02 AM  Tracey Lin  has presented today for surgery, with the diagnosis of atrial fibrillation.  The various methods of treatment have been discussed with the patient and family. After consideration of risks, benefits and other options for treatment, the patient has consented to  Procedure(s): ATRIAL FIBRILLATION ABLATION (N/A) as a surgical intervention.  The patient's history has been reviewed, patient examined, no change in status, stable for surgery.  I have reviewed the patient's chart and labs.  Questions were answered to the patient's satisfaction.     Larron Armor T Jeffrey Graefe

## 2022-04-23 NOTE — Progress Notes (Signed)
Pt ambulated to and from bathroom to void with no oozing from bilateral groin sites 

## 2022-04-26 ENCOUNTER — Encounter (HOSPITAL_COMMUNITY): Payer: Self-pay | Admitting: Cardiology

## 2022-04-26 ENCOUNTER — Telehealth: Payer: Self-pay | Admitting: Cardiology

## 2022-04-26 NOTE — Telephone Encounter (Signed)
Patient reports pain in her groin, hip, and lower back. She wanted to know if she should use heat or ice and any other things to help with the pain. Encouraged her to call PCP office for further recommendations. She verbalized understanding with no further questions at this time.

## 2022-04-26 NOTE — Telephone Encounter (Signed)
Patient stated she has been having pain in her left leg.  Patient stated the upper part of her leg, her hip and that whole region is having a stabbing pain and today she has also been feeling naseaous she has been taking Tylenol PM.

## 2022-04-27 ENCOUNTER — Emergency Department: Payer: 59

## 2022-04-27 ENCOUNTER — Emergency Department
Admission: EM | Admit: 2022-04-27 | Discharge: 2022-04-27 | Disposition: A | Discharge status: Home / Self Care | Payer: 59 | Attending: Emergency Medicine | Admitting: Emergency Medicine

## 2022-04-27 ENCOUNTER — Telehealth: Payer: Self-pay | Admitting: Cardiology

## 2022-04-27 ENCOUNTER — Other Ambulatory Visit: Payer: Self-pay

## 2022-04-27 ENCOUNTER — Encounter: Payer: Self-pay | Admitting: Emergency Medicine

## 2022-04-27 DIAGNOSIS — I480 Paroxysmal atrial fibrillation: Secondary | ICD-10-CM | POA: Insufficient documentation

## 2022-04-27 DIAGNOSIS — R42 Dizziness and giddiness: Secondary | ICD-10-CM | POA: Diagnosis not present

## 2022-04-27 DIAGNOSIS — T148XXA Other injury of unspecified body region, initial encounter: Secondary | ICD-10-CM

## 2022-04-27 DIAGNOSIS — I97638 Postprocedural hematoma of a circulatory system organ or structure following other circulatory system procedure: Secondary | ICD-10-CM | POA: Insufficient documentation

## 2022-04-27 DIAGNOSIS — R1032 Left lower quadrant pain: Secondary | ICD-10-CM | POA: Diagnosis not present

## 2022-04-27 DIAGNOSIS — Y838 Other surgical procedures as the cause of abnormal reaction of the patient, or of later complication, without mention of misadventure at the time of the procedure: Secondary | ICD-10-CM | POA: Diagnosis not present

## 2022-04-27 DIAGNOSIS — M7981 Nontraumatic hematoma of soft tissue: Secondary | ICD-10-CM | POA: Diagnosis not present

## 2022-04-27 LAB — BASIC METABOLIC PANEL
Anion gap: 10 (ref 5–15)
BUN: 14 mg/dL (ref 6–20)
CO2: 25 mmol/L (ref 22–32)
Calcium: 9 mg/dL (ref 8.9–10.3)
Chloride: 103 mmol/L (ref 98–111)
Creatinine, Ser: 0.82 mg/dL (ref 0.44–1.00)
GFR, Estimated: >60 mL/min (ref >60)
Glucose, Bld: 165 mg/dL — ABNORMAL HIGH (ref 70–99)
Potassium: 3.8 mmol/L (ref 3.5–5.1)
Sodium: 138 mmol/L (ref 135–145)

## 2022-04-27 LAB — CBC
HCT: 43.1 % (ref 36.0–46.0)
Hemoglobin: 13.7 g/dL (ref 12.0–15.0)
MCH: 29.3 pg (ref 26.0–34.0)
MCHC: 31.8 g/dL (ref 30.0–36.0)
MCV: 92.3 fL (ref 80.0–100.0)
Platelets: 315 10*3/uL (ref 150–400)
RBC: 4.67 MIL/uL (ref 3.87–5.11)
RDW: 13 % (ref 11.5–15.5)
WBC: 17 10*3/uL — ABNORMAL HIGH (ref 4.0–10.5)
nRBC: 0 % (ref 0.0–0.2)

## 2022-04-27 MED ORDER — CEPHALEXIN 500 MG PO CAPS: 500.0000 mg | ORAL_CAPSULE | Freq: Four times a day (QID) | ORAL | 0 refills | Status: AC

## 2022-04-27 MED ORDER — HYDROCODONE-ACETAMINOPHEN 5-325 MG PO TABS: 1.0000 | ORAL_TABLET | Freq: Four times a day (QID) | ORAL | 0 refills | Status: AC | PRN

## 2022-04-27 MED ORDER — DOXYCYCLINE MONOHYDRATE 100 MG PO TABS: 100.0000 mg | ORAL_TABLET | Freq: Two times a day (BID) | ORAL | 0 refills | Status: AC

## 2022-04-27 MED ORDER — CEPHALEXIN 500 MG PO CAPS
500.0000 mg | ORAL_CAPSULE | Freq: Once | ORAL | Status: AC
  Administered 2022-04-27: 500 mg via ORAL
  Filled 2022-04-27: qty 1

## 2022-04-27 MED ORDER — DOXYCYCLINE HYCLATE 100 MG PO TABS
100.0000 mg | ORAL_TABLET | Freq: Once | ORAL | Status: AC
  Administered 2022-04-27: 100 mg via ORAL
  Filled 2022-04-27: qty 1

## 2022-04-27 MED ORDER — ONDANSETRON 4 MG PO TBDP: 4.0000 mg | ORAL_TABLET | Freq: Three times a day (TID) | ORAL | 0 refills | Status: AC | PRN

## 2022-04-27 NOTE — Discharge Instructions (Addendum)
Would like for you to hold your Eliquis for the next 2 days. You have been given your first dose of Keflex and doxycycline while in the emergency department. Take Keflex, 2 tablets in the morning and 2 tablets at night for the next 7 days. Take doxycycline, 1 tablet in the morning and 1 tablet at night for the next 7 days.

## 2022-04-27 NOTE — ED Triage Notes (Signed)
Pt via POV from home. Pt had a cardiac ablation on 3/29 states that they entered both side of her groin. This is pt's 2nd ablation and states after the first one she did not have these symptom. Pt has a hx of afib. Starting Saturday morning, pt has been having pain and bruising to the L side. Pt also reports dizziness and nausea since procedure. Pt does take Eliquis. Pt is A&OX4 and NAD

## 2022-04-27 NOTE — Telephone Encounter (Signed)
Pt is calling back stating she is currently in the hospital because of her pain. Unfortunately the pt stated that she has been there since 10:30 AM and all they have done is checked her vitals. Pt stated she is in severe pain and would like to speak with someone. Please advise

## 2022-04-27 NOTE — Telephone Encounter (Signed)
Patient following up. She states she called in yesterday but was  ont satisfied with response of nurse. Post ablation, 3/29--Right leg is fine. Left leg is inflamed, warm to touch, and in severe pain. She states she can barely walk due to the pain. She states she has been taking Tylenol every 4 hours, but she would like to know what else can relieve the pain. She states yesterday she was advised to contact her PCP, but she doesn't understand why she needs to contact PCP when her ablation was with cardiology.

## 2022-04-27 NOTE — ED Provider Notes (Signed)
[facility] Provider Note  Patient Contact: 4:17 PM (approximate)   History   Post-op Problem   HPI  Tracey Lin is a 62 y.o. female with a history of GERD, anxiety and paroxysmal A-fib, presents to the emergency department with 10 out of 10 left-sided groin pain.  Patient had ablation by cardiologist Dr. Quentin Ore on 04/23/22.  Patient states that she has had severe pain and has not had similar discomfort with prior ablations.  She is currently anticoagulated with Eliquis.  No chest pain, chest tightness or shortness of breath.  No erythema overlying left groin.  Patient reached out to her cardiologist who recommended seeking care in the emergency department to rule out hematoma, cellulitis and pseudoaneurysm.     Physical Exam   Triage Vital Signs: ED Triage Vitals  Enc Vitals Group     BP 04/27/22 1201 128/82     Pulse Rate 04/27/22 1201 85     Resp 04/27/22 1201 20     Temp 04/27/22 1201 98.9 F (37.2 C)     Temp Source 04/27/22 1201 Oral     SpO2 04/27/22 1201 100 %     Weight 04/27/22 1200 219 lb (99.3 kg)     Height 04/27/22 1200 5\' 5"  (1.651 m)     Head Circumference --      Peak Flow --      Pain Score 04/27/22 1159 10     Pain Loc --      Pain Edu? --      Excl. in [place]? --     Most recent vital signs: Vitals:   04/27/22 1201 04/27/22 1610  BP: 128/82 130/84  Pulse: 85 99  Resp: 20 18  Temp: 98.9 F (37.2 C) 98.9 F (37.2 C)  SpO2: 100% 99%     General: Alert and in no acute distress. Eyes:  PERRL. EOMI. Head: No acute traumatic findings ENT:      Nose: No congestion/rhinnorhea.      Mouth/Throat: Mucous membranes are moist.  Neck: No stridor. No cervical spine tenderness to palpation. Cardiovascular:  Good peripheral perfusion Respiratory: Normal respiratory effort without tachypnea or retractions. Lungs CTAB. Good air entry to the bases with no decreased or absent breath sounds. Gastrointestinal: Bowel sounds 4  quadrants. Soft and nontender to palpation. No guarding or rigidity. No palpable masses. No distention. No CVA tenderness. Musculoskeletal: Full range of motion to all extremities.  Neurologic:  No gross focal neurologic deficits are appreciated.  Skin: Patient has hematoma overlying left groin.  Region is nonindurated with no overlying erythema.    ED Results / Procedures / Treatments   Labs (all labs ordered are listed, but only abnormal results are displayed) Labs Reviewed  BASIC METABOLIC PANEL - Abnormal; Notable for the following components:      Result Value   Glucose, Bld 165 (*)    All other components within normal limits  CBC - Abnormal; Notable for the following components:   WBC 17.0 (*)    All other components within normal limits  URINALYSIS, ROUTINE W REFLEX MICROSCOPIC       RADIOLOGY  I personally viewed and evaluated these images as part of my medical decision making, as well as reviewing the written report by the radiologist.  ED Provider Interpretation: Patient has a 3.1 cm hematoma but no evidence of pseudoaneurysm.   PROCEDURES:  Critical Care performed: No  Procedures   MEDICATIONS ORDERED IN ED: Medications  doxycycline (VIBRA-TABS) tablet  100 mg (100 mg Oral Given 04/27/22 1840)  cephALEXin (KEFLEX) capsule 500 mg (500 mg Oral Given 04/27/22 1840)     IMPRESSION / MDM / ASSESSMENT AND PLAN / ED COURSE  I reviewed the triage vital signs and the nursing notes.                              Assessment and plan:  Groin pain 62 year old female presents to the emergency department with left-sided groin pain since patient had an ablation.  Differential diagnosis includes pseudoaneurysm, cellulitis and hematoma.  Will obtain limited arterial ultrasound and will reassess.  No evidence of pseudoaneurysm on limited arterial ultrasound.  There is no overlying erythema to suggest cellulitis.  I did talk to patient's cardiologist, Dr. Quentin Ore who  conducted ablation.  He did recommend holding Eliquis for the next 2 days as hematoma is fairly new in terms of development.  He did recommend covering patient with antibiotics and patient was started on her first dose of doxycycline and Keflex while in the emergency department.  Patient was discharged with doxycycline and Keflex and prescribed a short course of Norco for pain.  Return precautions were given to return with new or worsening symptoms.  All patient questions were answered.      FINAL CLINICAL IMPRESSION(S) / ED DIAGNOSES   Final diagnoses:  Hematoma     Rx / DC Orders   ED Discharge Orders          Ordered    cephALEXin (KEFLEX) 500 MG capsule  4 times daily        04/27/22 1832    doxycycline (ADOXA) 100 MG tablet  2 times daily        04/27/22 1832    HYDROcodone-acetaminophen (NORCO) 5-325 MG tablet  Every 6 hours PRN        04/27/22 1834    ondansetron (ZOFRAN-ODT) 4 MG disintegrating tablet  Every 8 hours PRN        04/27/22 1834             Note:  This document was prepared using Dragon voice recognition software and may include unintentional dictation errors.   Vallarie Mare [place], Hershal Coria 04/27/22 1857    Harvest Dark, MD 04/27/22 2216

## 2022-04-27 NOTE — Telephone Encounter (Signed)
Spoke to the patient, on 4/1 she called and spoke to a triage at another location was advised to take Tylenol and contact PCP. Patient is currently experiencing left leg pain ( 10 on pain scale) front of the leg to the groin area, difficult to walk  and warm to touch. She has not had bowel movement since 3/28. Patient stated on 3/29, she was in little pain however, since 3/30 her pain and leg pain  has increase . She is currently taking tylenol however, this has not helped with decreased the pain. Spoke with Joesph July, PA-C , contacted Dr. Quentin Ore advised patient to go to the nearest emergency room. Patient voiced understanding.

## 2022-04-28 NOTE — Telephone Encounter (Signed)
Spoke with the patient who states that she is still in some pain and has been having some nausea and headaches. She states that overall she is doing better though. Advised her to take Zofran for her nausea. She will let us know if nothing improves after several days.

## 2022-04-29 ENCOUNTER — Telehealth: Payer: Self-pay | Admitting: Cardiology

## 2022-04-29 NOTE — Telephone Encounter (Signed)
Spoke with the patient who reports that she got up in the middle of the night and bent over to put on socks. She states that ever since she has had pain under left breast and around to her back when she takes a deep breath or moves. She states that overall she is feeling better since her visit to the ER yesterday. Dr. Quentin Ore spoke with the patient as well.  No changes. Most likely pulled something from bending over in a weird position due to leg pain from issues with her groin incision site.

## 2022-04-29 NOTE — Telephone Encounter (Signed)
Pt c/o of Chest Pain: STAT if CP now or developed within 24 hours  1. Are you having CP right now?   Yes  2. Are you experiencing any other symptoms (ex. SOB, nausea, vomiting, sweating)?   No  3. How long have you been experiencing CP?   Started around 3:00 am  4. Is your CP continuous or coming and going?   Only when making movements or if breathing  5. Have you taken Nitroglycerin?   No  Patient stated when she takes a deep breath she gets a pain under her left breast, under her armpit.  Patient noted she had an ablation last Friday and she is taking antibiotics for an infection she has since developed.

## 2022-05-03 DIAGNOSIS — Z8249 Family history of ischemic heart disease and other diseases of the circulatory system: Secondary | ICD-10-CM | POA: Diagnosis not present

## 2022-05-03 DIAGNOSIS — L0291 Cutaneous abscess, unspecified: Secondary | ICD-10-CM | POA: Diagnosis not present

## 2022-05-03 DIAGNOSIS — Z825 Family history of asthma and other chronic lower respiratory diseases: Secondary | ICD-10-CM | POA: Diagnosis not present

## 2022-05-03 DIAGNOSIS — I4891 Unspecified atrial fibrillation: Secondary | ICD-10-CM | POA: Diagnosis not present

## 2022-05-03 DIAGNOSIS — Z7901 Long term (current) use of anticoagulants: Secondary | ICD-10-CM | POA: Diagnosis not present

## 2022-05-03 DIAGNOSIS — Z800 Family history of malignant neoplasm of digestive organs: Secondary | ICD-10-CM | POA: Diagnosis not present

## 2022-05-03 DIAGNOSIS — Z6836 Body mass index (BMI) 36.0-36.9, adult: Secondary | ICD-10-CM | POA: Diagnosis not present

## 2022-05-03 DIAGNOSIS — F419 Anxiety disorder, unspecified: Secondary | ICD-10-CM | POA: Diagnosis not present

## 2022-05-03 DIAGNOSIS — G4733 Obstructive sleep apnea (adult) (pediatric): Secondary | ICD-10-CM | POA: Diagnosis not present

## 2022-05-03 NOTE — Progress Notes (Deleted)
Cardiology Office Note Date:  05/03/2022  Patient ID:  Tracey, Lin [birth year], MRN [id number] PCP:  Patrice Paradise, MD  Electrophysiologist:  Dr. Johney Frame (ret.) >> Dr. Lalla Brothers      Chief Complaint:  History of Present Illness: Tracey Lin is a 62 y.o. female with history of GERD, OSA noncompliant w/CPAP, and paroxysmal Afib.  S/p re-do PVI ablation 04/23/22  ER visit 04/27/22 with left groin pain, [place] noted no pseudoaneurysm, ER note reports no cellulitis, in d/w Dr.Lambert, planned to hold Eliquis 2 days for hematoma, and covering with antibiotic and rx doxycycline and Keflex as well as Norco.  *** groin *** back on Eliquis *** otherwsie OK? *** AF?    AFib Hx initial diagnosis 15 + year, long hiatus without until 07/2017 03/25/2020 PVI ablation (Dr. 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 Lalla Brothers  AAD 07/2017, Multaq and Ranexa  >>> stopped 07/2017 given rare exacerbations Flecainide started 01/01/22     Past Medical History:  Diagnosis Date   Anxiety    B12 deficiency    Difficult intubation    "SMALL TRACHEA"   GERD (gastroesophageal reflux disease)    History of kidney stones    H/O   OSA (obstructive sleep apnea)    USES CPAP ( rarely)   Paroxysmal atrial fibrillation     Past Surgical History:  Procedure Laterality Date   ATRIAL FIBRILLATION ABLATION N/A 03/25/2020   Procedure: ATRIAL FIBRILLATION ABLATION;  Surgeon: Hillis Range, MD;  [facility];  Service: Cardiovascular;  Laterality: N/A;   ATRIAL FIBRILLATION ABLATION N/A 04/23/2022   Procedure: ATRIAL FIBRILLATION ABLATION;  Surgeon: Lanier Prude, MD;  Location: [facility];  Service: Cardiovascular;  Laterality: N/A;   AUGMENTATION MAMMAPLASTY  2003   BREAST BIOPSY Right 2005   benign    COLONOSCOPY     DILATATION & CURETTAGE/HYSTEROSCOPY WITH MYOSURE N/A 01/02/2018   Procedure: DILATATION & CURETTAGE/HYSTEROSCOPY WITH MYOSURE, polypectomy;  Surgeon: Christeen Douglas, MD;  Location: [facility];  Service: Gynecology;  Laterality: N/A;   ESOPHAGOGASTRODUODENOSCOPY     KNEE ARTHROSCOPY Left 12/02/2017   Procedure: ARTHROSCOPY KNEE WITH PARTIAL MEDIAL MENISECTOMY, REMOVAL OF LOOSE BODY;  Surgeon: Signa Kell, MD;  Location: [facility];  Service: Orthopedics;  Laterality: Left;   REDUCTION MAMMAPLASTY  2007   TUMMY TUCK  2006    Current Outpatient Medications  Medication Sig Dispense Refill   acetaminophen (TYLENOL) 500 MG tablet Take 500 mg by mouth as needed for mild pain, moderate pain or headache.     ALPRAZolam (XANAX) 0.5 MG tablet Take 0.5-1 tablets (0.25-0.5 mg total) by mouth at bedtime as needed for anxiety. 30 tablet 0   apixaban (ELIQUIS) 5 MG TABS tablet Take 1 tablet (5 mg total) by mouth 2 (two) times daily. 60 tablet 6   cephALEXin (KEFLEX) 500 MG capsule Take 1 capsule (500 mg total) by mouth 4 (four) times daily for 7 days. 28 capsule 0   doxycycline (ADOXA) 100 MG tablet Take 1 tablet (100 mg total) by mouth 2 (two) times daily for 7 days. 14 tablet 0   flecainide (TAMBOCOR) 50 MG tablet Take 50 mg by mouth 2 (two) times daily.     Melatonin 10 MG CHEW Chew 20 mg by mouth daily as needed (Sleep).  metoprolol tartrate (LOPRESSOR) 25 MG tablet TAKE 1/2 TABLET (12.5 MG TOTAL) BY MOUTH 2 (TWO) TIMES DAILY. 30 tablet 2   pantoprazole (PROTONIX) 40 MG tablet Take 40 mg by mouth daily as needed (Fro stomach acid).     No current facility-administered medications for this visit.    Allergies:   Codeine and Ivp dye [iodinated contrast media]   Social History:  The patient  reports that she has never smoked. She has never used smokeless tobacco. She reports current alcohol use of about 1.0 standard drink of alcohol per week. She reports that she does not use drugs.    Family History:  The patient's family history includes CAD in her mother; Heart disease in her mother; Pancreatic cancer in her father.  ROS:  Please see the history of present illness.  All other systems are reviewed and otherwise negative.   PHYSICAL EXAM:  VS:  LMP 08/09/2011 (Approximate) Comment: PT STILL SPOTS AS OF 08-08-17 BMI: There is no height or weight on file to calculate BMI. Well nourished, well developed, in no acute distress  HEENT: normocephalic, atraumatic  Neck: no JVD, carotid bruits or masses Cardiac: *** RRR; no significant murmurs, no rubs, or gallops Lungs: ***  CTA b/l, no wheezing, rhonchi or rales  Abd: soft, nontender MS: no deformity or atrophy Ext: *** no edema  Skin: warm and dry, no rash Neuro:  No gross deficits appreciated Psych: euthymic mood, full affect  *** groin     EKG:  not done today  04/23/22; EPS/ablation CONCLUSIONS: 1. Successful redo PVI (reisolation of the left superior pulmonary vein) 2. 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 Intracardiac echo reveals trivial pericardial effusion 4. No early apparent complications. 5.  Stop flecainide   01/21/22; TTE 1. Left ventricular ejection fraction, by estimation, is 55 to 60%. The  left ventricle has normal function. The left ventricle has no regional  wall motion abnormalities. Left ventricular diastolic parameters were  normal.   2. Right ventricular systolic function is normal. The right ventricular  size is normal.   3. The mitral valve is normal in structure. Trivial mitral valve  regurgitation. No evidence of mitral stenosis.   4. The aortic valve is normal in structure. Aortic valve regurgitation is  not visualized. No aortic stenosis is present.   5. The inferior vena cava is normal in size with greater than 50%   respiratory variability, suggesting right atrial pressure of 3 mmHg.    03/25/20: EPS/ablation CONCLUSIONS: 1. Sinus rhythm upon presentation.   2. Intracardiac echo reveals a mildly enlarged sized left atrium with four separate pulmonary veins without evidence of pulmonary vein stenosis. 3. Successful electrical isolation and anatomical encircling of all four pulmonary veins with radiofrequency current. 4. 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  This was not ablated today as this has not been a clinical arrhythmia for her. 5. No early apparent complications.  08/11/17: TTE Conclusion A contrast injection of Definity was performed to improve assessment of LV function. Ejection Fraction = 60-65%. There is no pericardial effusion. The left atrial size is normal.   Recent Labs: 01/20/2022: Magnesium 2.0; TSH 3.649 04/27/2022: BUN 14; Creatinine, Ser 0.82; Hemoglobin 13.7; Platelets 315; Potassium 3.8; Sodium 138  11/19/2021: Cholesterol 172; HDL 38; LDL Cholesterol 115; Total CHOL/HDL Ratio 4.5; Triglycerides 94;  VLDL 19   Estimated Creatinine Clearance: 85.1 mL/min (by C-G formula based on SCr of 0.82 mg/dL).   Wt Readings from Last 3 Encounters:  04/27/22 219 lb (99.3 kg)  04/23/22 219 lb (99.3 kg)  03/24/22 218 lb (98.9 kg)     Other studies reviewed: Additional studies/records reviewed today include: summarized above  ASSESSMENT AND PLAN:  1. Paroxysmal AFib     CHA2DS2Vasc is *** one (female), on Eliquis     Flecainide and ***, intervals    ***    Disposition: ***    Current medicines are reviewed at length with the patient today.  The patient did not have any concerns regarding medicines.  Norma FredricksonSigned, Jhada Risk, PA-C 05/03/2022 1:12 PM     [facility] [address] [place] [address] [phone] (office)  [phone] (fax)

## 2022-05-03 NOTE — Anesthesia Postprocedure Evaluation (Signed)
Anesthesia Post Note  Patient: Tracey Lin  Procedure(s) Performed: ATRIAL FIBRILLATION ABLATION     Patient location during evaluation: PACU Anesthesia Type: General Level of consciousness: awake and alert Pain management: pain level controlled Vital Signs Assessment: post-procedure vital signs reviewed and stable Respiratory status: spontaneous breathing, nonlabored ventilation, respiratory function stable and patient connected to nasal cannula oxygen Cardiovascular status: blood pressure returned to baseline and stable Postop Assessment: no apparent nausea or vomiting Anesthetic complications: no   There were no known notable events for this encounter.  Last Vitals:  Vitals:   04/23/22 1500 04/23/22 1600  BP: (!) 143/83 (!) 145/89  Pulse: 77 79  Resp: 14 19  Temp:    SpO2: 95% 96%    Last Pain:  Vitals:   04/23/22 1229  TempSrc:   PainSc: 0-No pain                 Patrizia Paule S

## 2022-05-06 ENCOUNTER — Ambulatory Visit: Payer: 59 | Attending: Physician Assistant | Admitting: Cardiology

## 2022-05-06 ENCOUNTER — Telehealth: Payer: Self-pay | Admitting: Cardiology

## 2022-05-06 ENCOUNTER — Encounter: Payer: Self-pay | Admitting: Cardiology

## 2022-05-06 ENCOUNTER — Ambulatory Visit: Payer: 59 | Admitting: Physician Assistant

## 2022-05-06 VITALS — BP 120/70 | HR 70 | Ht 65.0 in | Wt 211.1 lb

## 2022-05-06 DIAGNOSIS — S301XXD Contusion of abdominal wall, subsequent encounter: Secondary | ICD-10-CM

## 2022-05-06 DIAGNOSIS — I480 Paroxysmal atrial fibrillation: Secondary | ICD-10-CM | POA: Diagnosis not present

## 2022-05-06 NOTE — Patient Instructions (Addendum)
Medication Instructions:   Your physician recommends that you continue on your current medications as directed. Please refer to the Current Medication list given to you today.  *If you need a refill on your cardiac medications before your next appointment, please call your pharmacy*   Lab Work:  No labs ordered today.  If you have labs (blood work) drawn today and your tests are completely normal, you will receive your results only by: MyChart Message (if you have MyChart) OR A paper copy in the mail If you have any lab test that is abnormal or we need to change your treatment, we will call you to review the results.   Testing/Procedures:  No testing ordered today.   Follow-Up: At [facility], you and your health needs are our priority.  As part of our continuing mission to provide you with exceptional heart care, we have created designated Provider Care Teams.  These Care Teams include your primary Cardiologist (physician) and Advanced Practice Providers (APPs -  Physician Assistants and Nurse Practitioners) who all work together to provide you with the care you need, when you need it.  We recommend signing up for the patient portal called "MyChart".  Sign up information is provided on this After Visit Summary.  MyChart is used to connect with patients for Virtual Visits (Telemedicine).  Patients are able to view lab/test results, encounter notes, upcoming appointments, etc.  Non-urgent messages can be sent to your provider as well.   To learn more about what you can do with MyChart, go to [email or web address].    Your next appointment:     Keep your appointment with Afib clinic 05/21/2022 at 11:30 am  Provider:   Steffanie Dunn, MD    Other Instructions  Please follow protocol instructed by Sherie Don, NP

## 2022-05-06 NOTE — Telephone Encounter (Signed)
Added patient to Suzann Riddle's schedule for 05/06/22 at 2:45 pm

## 2022-05-06 NOTE — Progress Notes (Signed)
Cardiology Office Note Date:  05/06/2022  Patient ID:  Tracey Lin, Tracey Lin 08/03/1960, MRN [id number] PCP:  Patrice Paradise, MD  Cardiologist:  Rudi Coco, NP (Inactive) Electrophysiologist: Lanier Prude, MD    Chief Complaint: groin hematoma  History of Present Illness: Tracey Lin is a 62 y.o. female with PMH notable for parox Afib, OSA; seen today for Lanier Prude, MD for acute visit due to chest pain after recent AF ablation and L groin hematoma.   She is s/p redo AF ablation 04/23/2022 She went to ER 04/27/2022 for 10/10 groin patient, fever; U/S showed no pseudoaneurysm, spoke to Dr. Lalla Brothers who recommended holding eliquis for 2 days and keflex and doxy.  She called clinic 04/29/22 with pain under L breast and around to her back. Was told it was likely MSK pain and to continue to monitor.  She presents today for further eval of hematoma. The pain has improved slightly. She is no longer febrile. Last day of abx is today, has not been taking keflex 4 times a day.  Her chest pain began after making an awkward movement in bed, and has now centralized more in her back.    AAD History: Flecainide + metop, flec stopped after recent AF ablation  Past Medical History:  Diagnosis Date   Anxiety    B12 deficiency    Difficult intubation    "SMALL TRACHEA"   GERD (gastroesophageal reflux disease)    History of kidney stones    H/O   OSA (obstructive sleep apnea)    USES CPAP ( rarely)   Paroxysmal atrial fibrillation     Past Surgical History:  Procedure Laterality Date   ATRIAL FIBRILLATION ABLATION N/A 03/25/2020   Procedure: ATRIAL FIBRILLATION ABLATION;  Surgeon: Hillis Range, MD;  [facility]: [facility];  Service: Cardiovascular;  Laterality: N/A;   ATRIAL FIBRILLATION ABLATION N/A 04/23/2022   Procedure: ATRIAL FIBRILLATION ABLATION;  Surgeon: Lanier Prude, MD;  Location: [facility];  Service: Cardiovascular;  Laterality: N/A;    AUGMENTATION MAMMAPLASTY  2003   BREAST BIOPSY Right 2005   benign   COLONOSCOPY     DILATATION & CURETTAGE/HYSTEROSCOPY WITH MYOSURE N/A 01/02/2018   Procedure: DILATATION & CURETTAGE/HYSTEROSCOPY WITH MYOSURE, polypectomy;  Surgeon: Christeen Douglas, MD;  Location: [facility];  Service: Gynecology;  Laterality: N/A;   ESOPHAGOGASTRODUODENOSCOPY     KNEE ARTHROSCOPY Left 12/02/2017   Procedure: ARTHROSCOPY KNEE WITH PARTIAL MEDIAL MENISECTOMY, REMOVAL OF LOOSE BODY;  Surgeon: Signa Kell, MD;  Location: [facility];  Service: Orthopedics;  Laterality: Left;   REDUCTION MAMMAPLASTY  2007   TUMMY TUCK  2006    Current Outpatient Medications  Medication Instructions   acetaminophen (TYLENOL) 500 mg, Oral, As needed   ALPRAZolam (XANAX) 0.25-0.5 mg, Oral, At bedtime PRN   apixaban (ELIQUIS) 5 mg, Oral, 2 times daily   flecainide (TAMBOCOR) 50 mg, Oral, 2 times daily   Melatonin 20 mg, Oral, Daily PRN   metoprolol tartrate (LOPRESSOR) 12.5 mg, Oral, 2 times daily   pantoprazole (PROTONIX) 40 mg, Oral, Daily PRN    Social History:  The patient  reports that she has never smoked. She has never used smokeless tobacco. She reports current alcohol use of about 1.0 standard drink of alcohol per week. She reports that she does not use drugs.   Family History:  The patient's family history includes CAD in her mother; Heart disease in her mother; Pancreatic cancer in her father.  ROS:  Please see the history of present illness. All other systems are reviewed and otherwise negative.   PHYSICAL EXAM:  VS:  LMP 08/09/2011 (Approximate) Comment: PT STILL SPOTS AS OF 08-08-17 BMI: There is no height or weight on file to calculate BMI.  GEN- The patient is well appearing, alert and oriented x 3 today.   Lungs- Clear to ausculation bilaterally, normal work of breathing.  Heart- Regular rate and rhythm, no murmurs, rubs or gallops Extremities- No peripheral edema, warm, dry  L groin - very firm, painful  area about the size of golf ball (marked in picture below) scant drainage.    EKG is not ordered. Personal review of EKG from  04/27/22  shows:  NSR  Recent Labs: 01/20/2022: Magnesium 2.0; TSH 3.649 04/27/2022: BUN 14; Creatinine, Ser 0.82; Hemoglobin 13.7; Platelets 315; Potassium 3.8; Sodium 138  11/19/2021: Cholesterol 172; HDL 38; LDL Cholesterol 115; Total CHOL/HDL Ratio 4.5; Triglycerides 94; VLDL 19   Estimated Creatinine Clearance: 85.1 mL/min (by C-G formula based on SCr of 0.82 mg/dL).   Wt Readings from Last 3 Encounters:  04/27/22 219 lb (99.3 kg)  04/23/22 219 lb (99.3 kg)  03/24/22 218 lb (98.9 kg)     Additional studies reviewed include: Previous EP, cardiology notes.   L lower ext u/s, 04/27/2022 1. 3.1 cm left inguinal hematoma. 2. No evidence of pseudoaneurysm or AV fistula.  AF ablation, 04/23/2022 1. Successful redo PVI (reisolation of the left superior pulmonary vein) 2. 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 Intracardiac echo reveals trivial pericardial effusion 4. No early apparent complications. 5. Stop flecainide  ASSESSMENT AND PLAN:  #) groin hematoma s/p afib ablation Recent ultrasound in ER showed no pseudoaneurysm Will complete abx course today No further febrile episodes Discussed with Dr. Lalla Brothers, no further abx recommended at this time Provided reassurance that area does appear to be healing Marked area of swelling for monitoring Discussed red flag symptoms (worsening swelling, purulent drainage, worsening pain) and to proceed to nearest ER   No heavy lifting/pushing/pulling until at least next follow up appt, already scheduled for 4/26 in AF clinic Discussed stool softeners to prevent constipation and having to bear down for BM   Current medicines are reviewed at length with the  patient today.   The patient does not have concerns regarding her medicines.  The following changes were made today:  none  Labs/ tests ordered today include:  No orders of the defined types were placed in this encounter.    Disposition: Follow up with Afib Clinic in as usual post procedure   Signed, Sherie Don, NP  05/06/22  12:34 PM  Electrophysiology [facility]

## 2022-05-21 ENCOUNTER — Ambulatory Visit (HOSPITAL_COMMUNITY)
Admission: RE | Admit: 2022-05-21 | Discharge: 2022-05-21 | Disposition: A | Discharge status: Home / Self Care | Payer: 59 | Source: Ambulatory Visit | Attending: Physician Assistant | Admitting: Physician Assistant

## 2022-05-21 VITALS — BP 122/74 | HR 60 | Ht 65.0 in | Wt 211.2 lb

## 2022-05-21 DIAGNOSIS — Z7901 Long term (current) use of anticoagulants: Secondary | ICD-10-CM | POA: Insufficient documentation

## 2022-05-21 DIAGNOSIS — I480 Paroxysmal atrial fibrillation: Secondary | ICD-10-CM | POA: Diagnosis not present

## 2022-05-21 DIAGNOSIS — I4891 Unspecified atrial fibrillation: Secondary | ICD-10-CM

## 2022-05-21 DIAGNOSIS — G4730 Sleep apnea, unspecified: Secondary | ICD-10-CM | POA: Diagnosis not present

## 2022-05-21 NOTE — Progress Notes (Signed)
Primary Care Physician: Patrice Paradise, MD Referring Physician:  Dr. Leda Gauze is a 62 y.o. female with a h/o paroxysmal afib, sleep apnea, treated with CPAP that is in the afib clinic for f/u hospitalization for afib while in [place], [place] to attend a [employer] rally in 2019. She was walking to her car and  was aware of increased HR, dizziness and lightheadedness and was taken to [facility].  She had been out in the sun for about 30 mins and her fluid intake had been less than usual  for the day. Her HR was 170 bpm. She was thought to have mild dehydration.She was admitted, placed on Cardizem drip for a couple of days then started on Multaq, ranexa, atenolol, eliquis and did convert to SR prior to discharge. The MD told pt that she would need quick f/u as multaq was for short term as it could cause liver failure. She is planning to see Dr. Johney Frame soon. She would like to discuss coming off drugs and a possible ablation down the line. CHA2DS2VASc score is 1 for female. She would like to get clearance to have her knee surgery as she wants this first so she can get back to exercising to obtain wight loss. Echo was done at [facility] and with normal results( all records can be found in Care Everywhere).   She has had afib for many years but it had been quiet, treated in the [place] area until she moved to [place] 6 years ago. She saw Dr. Darrold Junker a couple of times but has not seen him since 2015.  She was suppose to have knee surgery last week but it was cancelled.  She states that she has gained  40 lbs over the last 6 years. She does use cpap, but still sleeps very poorly. Feels that she does get at least 4 hours a night use.  F/u in afib clinic 9/9. When she saw Dr. Johney Frame, he advised her to start weaning off the ranexa, multaq and DOAC which she has successfully done. She  feels well. No further afib. She did have a SBO which self released right after I saw her last in  clinic. No further reoccurrence.   F/u in afib clinic 02/22/18, as she had a ER visit for afib and went to the ER for a cardioversion. She had multaq and eliquis on hand and took these but did not help to restore SR.  The cardioversion was successful and she is in SR today. She still would prefer to stay off daily meds . She has talked to Dr. Johney Frame about an ablation in the past but is still not quite ready for this either unless afib burden increases. She is seeing a weight loss specialist. She has not used her cpap for sleep apnea in some time, cannot tolerate mask. She thinks the trigger was lack of sleep and working long hours.   F/u in AF clinic 07/11/18. Patient reports that last night she began having feelings of SOB, weakness, and heart racing. In office today she is in afib with RVR and is very SOB, especially when walking. She has not passed out but has also been very lightheaded. She is not on Eliquis or Multaq at this time.  She was sent to the ER for urgent cardioversion.  F/u afib clinic, 07/21/18. She is s/p successful  CV and is staying in rhythm. She remains on Multaq and Eliquis but does not want to  stay on daily long term. She cannot tolerate the cpap and states that she sleeps poorly. She has stopped going to the weight loss MD as it was inconvenient for her in [place]. She has gained weight with Covid and working from home.   F/u in afib clinic, 03/30/19. She  had return of rapid afib last night with difficulty breathing and lightheadedness and asked to be seen today. She  in in afib at 138 bpm. BP soft around 100 systolic.  She is not on anticoagulation  for a CHA2DS2VASc score of 1. Last time I saw her in June she required an urgent cardioversion. Was on Multaq for a while but stopped in August 2020. She takes anticoagulation for the 4 weeks after cardioversion but then stops drug per protocol.  She did have an episode last week for a few hours of elevated HR but self converted. Discussed with  Dr. Johney Frame and he felt she should proceed to the ER for an urgent cardioversion as she is not on anticoagulation and is in the 48 hours of onset afib. Has not used cpap for OSA in past.  F/u in afib clinic, 04/13/19.  after successful cardioversion in the ER. She continues on eliquis 5 mg bid for post cardioversion protocol. Discussion today if she prefers to go back on Multaq bid to help prevent afib as she has had 2 cardioversions 6 months apart, or if she wants to discuss ablation with Dr. Johney Frame. She prefers to discuss with Dr. Johney Frame as she does not like to take meds. Will need updated echo.   F/u in afib clinic, 01/07/20. She had an appointment with Dr. Johney Frame 3 months ago but no change in approach to afib management was made. She is in SR today. No  further afib episodes to report. She is in SR today. She continues on atenolol,  CHA2DS2VASc score of 1, on daily asa and off eliquis after 4 weeks following last  cardioversion. She previously took Advertising copywriter, it worked well but she did not want to continue daily meds. She continues use of CPAP. She would prefer not to pursue ablation at this time.   F/u in afib clinic, 04/23/19. She is now one month s/p afib ablation and is very happy with the results. She has not noted any afib. No swallowing or groin issues. She is back to her usual activities. Remains on eliquis 5 mg bid for a CHA2DS2VASc score of 1.   F/u in afib clinic, 04/01/21, as she reported having 2-3 breakthrough afib episodes. The longest was 3 days. She was under some additional stress at that time. She is in SR today. Sheis not oon anticoagulation by guidelines with a CHA2DS2VASc  score of 1.   F/u in afib clinic, 12/22/21, after having to report to ED for a heart rate in afib around 180 bpm and palpable BP at 90 systolic and being symptomatic with presyncope. She was started on anticoagulation and converted with IV cardizem. She has not had any further afib. She is pending an appointment with  Dr. Lalla Brothers 12/20 to discuss second ablation. With a CHA2DS2VASc  score of 1, she wishes to stop anticoagulation since it has been one month since she chemically returned to SR, knowing if she has an ablation she will need to start it back 3 weeks prior.   On follow up today, she is currently in SR. She is now s/p Afib ablation and ablation of an atrial tachycardia in the right atrium on 04/23/22 by  Dr. Lalla Brothers. She was on flecainide as a bridge prior to ablation and this was stopped after ablation. She went to the ED on 4/2 for acute left groin pain with no evidence of pseudoaneurysm or cellulitis and was begun on doxy and Keflex for empiric coverage. Eliquis held for 2 days due to new hematoma. Called 4/4 with pain under left breast and this was monitored. Seen by EP on 4/11 with improved groin pain and plan for no further antibiotic therapy.  Since OV on 4/11, she has been doing well. No episodes of Afib. Left groin has healed and she has no pain or fevers. Pain under left breast resolved. She is currently taking Lopressor 12.5 mg BID.   No missed doses of Eliquis.    Today, she denies symptoms of palpitations, chest pain,+ shortness of breath/lightedness, no orthopnea, PND, lower extremity edema, dizziness, presyncope, syncope, or neurologic sequela. The patient is tolerating medications without difficulties and is otherwise without complaint today.   Past Medical History:  Diagnosis Date   Anxiety    B12 deficiency    Difficult intubation    "SMALL TRACHEA"   GERD (gastroesophageal reflux disease)    History of kidney stones    H/O   OSA (obstructive sleep apnea)    USES CPAP ( rarely)   Paroxysmal atrial fibrillation (HCC)    Past Surgical History:  Procedure Laterality Date   ATRIAL FIBRILLATION ABLATION N/A 03/25/2020   Procedure: ATRIAL FIBRILLATION ABLATION;  Surgeon: Hillis Range, MD;  Location: [facility] CV [facility];  Service: Cardiovascular;  Laterality: N/A;   ATRIAL FIBRILLATION  ABLATION N/A 04/23/2022   Procedure: ATRIAL FIBRILLATION ABLATION;  Surgeon: Lanier Prude, MD;  Location: [facility] INVAS[facility] CV LAB;  Service: Cardiovascular;  Laterality: N/A;   AUGMENTATION MAMMAPLASTY  2003   BREAST BIOPSY Right 2005   benign   COLONOSCOPY     DILATATION & CURETTAGE/HYSTEROSCOPY WITH MYOSURE N/A 01/02/2018   Procedure: DILATATION & CURETTAGE/HYSTEROSCOPY WITH MYOSURE, polypectomy;  Surgeon: Christeen Douglas, MD;  Location: [facility] ORS;  Service: Gynecology;  Laterality: N/A;   ESOPHAGOGASTRODUODENOSCOPY     KNEE ARTHROSCOPY Left 12/02/2017   Procedure: ARTHROSCOPY KNEE WITH PARTIAL MEDIAL MENISECTOMY, REMOVAL OF LOOSE BODY;  Surgeon: Signa Kell, MD;  Location: [facility] ORS;  Service: Orthopedics;  Laterality: Left;   REDUCTION MAMMAPLASTY  2007   TUMMY TUCK  2006    Current Outpatient Medications  Medication Sig Dispense Refill   acetaminophen (TYLENOL) 500 MG tablet Take 500 mg by mouth as needed for mild pain, moderate pain or headache.     ALPRAZolam (XANAX) 0.5 MG tablet Take 0.5-1 tablets (0.25-0.5 mg total) by mouth at bedtime as needed for anxiety. 30 tablet 0   apixaban (ELIQUIS) 5 MG TABS tablet Take 1 tablet (5 mg total) by mouth 2 (two) times daily. 60 tablet 6   diphenhydramine-acetaminophen (TYLENOL PM) 25-500 MG TABS tablet Take 1 tablet by mouth at bedtime as needed.     Melatonin 10 MG CHEW Chew 20 mg by mouth daily as needed (Sleep).     metoprolol tartrate (LOPRESSOR) 25 MG tablet TAKE 1/2 TABLET (12.5 MG TOTAL) BY MOUTH 2 (TWO) TIMES DAILY. 30 tablet 2   pantoprazole (PROTONIX) 40 MG tablet Take 40 mg by mouth as needed.     No current facility-administered medications for this encounter.    Allergies  Allergen Reactions   Codeine Nausea And Vomiting   Ivp Dye [Iodinated Contrast Media] Itching    Social History   Socioeconomic  History   Marital status: Divorced    Spouse name: Not on file   Number of children: Not on file   Years of education:  Not on file   Highest education level: Not on file  Occupational History   Occupation: realtor  Tobacco Use   Smoking status: Never   Smokeless tobacco: Never   Tobacco comments:    Never smoke 12/22/21  Vaping Use   Vaping Use: Never used  Substance and Sexual Activity   Alcohol use: Yes    Alcohol/week: 1.0 standard drink of alcohol    Types: 1 Glasses of wine per week   Drug use: Never   Sexual activity: Not on file  Other Topics Concern   Not on file  Social History Narrative   Lives in [place]    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   Social Determinants of Health   Financial Resource Strain: Not on file  Food Insecurity: No Food Insecurity (11/18/2021)   Hunger Vital Sign    Worried About Running Out of Food in the Last Year: Never true    Ran Out of Food in the Last Year: Never true  Transportation Needs: No Transportation Needs (11/18/2021)   PRAPARE - Administrator, Civil Service (Medical): No    Lack of Transportation (Non-Medical): No  Physical Activity: Not on file  Stress: Not on file  Social Connections: Not on file  Intimate Partner Violence: Not At Risk (11/18/2021)   Humiliation, Afraid, Rape, and Kick questionnaire    Fear of Current or Ex-Partner: No    Emotionally Abused: No    Physically Abused: No    Sexually Abused: No    Family History  Problem Relation Age of Onset   CAD Mother    Heart disease Mother    Pancreatic cancer Father     ROS- All systems are reviewed and negative except as per the HPI above  Physical Exam: Vitals:   05/21/22 1135  BP: 122/74  Pulse: 60  Weight: 95.8 kg  Height: 5\' 5"  (1.651 m)    Wt Readings from Last 3 Encounters:  05/21/22 95.8 kg  05/06/22 95.8 kg  04/27/22 99.3 kg    Labs: Lab Results  Component Value Date   NA 138 04/27/2022   K 3.8 04/27/2022   CL 103 04/27/2022   CO2 25 04/27/2022   GLUCOSE 165 (H) 04/27/2022   BUN 14 04/27/2022    CREATININE 0.82 04/27/2022   CALCIUM 9.0 04/27/2022   MG 2.0 01/20/2022   Lab Results  Component Value Date   INR 1.13 08/17/2017   Lab Results  Component Value Date   CHOL 172 11/19/2021   HDL 38 (L) 11/19/2021   LDLCALC 115 (H) 11/19/2021   TRIG 94 11/19/2021    GEN- The patient is well appearing, alert and oriented x 3 today.   Head- normocephalic, atraumatic Eyes-  Sclera clear, conjunctiva pink Ears- hearing intact Oropharynx- clear Neck- supple, no JVP Lymph- no cervical lymphadenopathy Lungs- Clear to ausculation bilaterally, normal work of breathing Heart- Regular rate and rhythm, no murmurs, rubs or gallops, PMI not laterally displaced GI- soft, NT, ND, + BS Extremities- no clubbing, cyanosis, or edema MS- no significant deformity or atrophy Skin- no rash or lesion Psych- euthymic mood, full affect Neuro- strength and sensation are intact   EKG -  Vent. rate 60 BPM PR interval 130 ms QRS duration 84 ms QT/QTcB 430/430 ms P-R-T axes  64 6 46 Normal sinus rhythm Normal ECG When compared with ECG of 27-Apr-2022 12:06, PREVIOUS ECG IS PRESENT  Echo 01/21/22: 1. Left ventricular ejection fraction, by estimation, is 55 to 60%. The  left ventricle has normal function. The left ventricle has no regional  wall motion abnormalities. Left ventricular diastolic parameters were  normal.   2. Right ventricular systolic function is normal. The right ventricular  size is normal.   3. The mitral valve is normal in structure. Trivial mitral valve  regurgitation. No evidence of mitral stenosis.   4. The aortic valve is normal in structure. Aortic valve regurgitation is  not visualized. No aortic stenosis is present.   5. The inferior vena cava is normal in size with greater than 50%  respiratory variability, suggesting right atrial pressure of 3 mmHg.   Assessment and Plan: 1.Symptomatic paroxysmal afib with RVR S/p ablation on 04/23/22.  She is currently in SR  today.   No more fevers. No issues with groin site.  Currently on Eliquis post ablation.   BP initially elevated but within normal range on recheck. She is planning to ask Dr. Lalla Brothers about possibly discontinuing Lopressor at 3 month visit. Continue for now.   F/u as scheduled with Dr. Lalla Brothers.  Lake Bells, PA-C Afib Clinic [address] [facility] [address] [place], [place] [address] [phone]

## 2022-05-24 DIAGNOSIS — I480 Paroxysmal atrial fibrillation: Secondary | ICD-10-CM | POA: Diagnosis not present

## 2022-05-24 DIAGNOSIS — R5383 Other fatigue: Secondary | ICD-10-CM | POA: Diagnosis not present

## 2022-05-24 DIAGNOSIS — E538 Deficiency of other specified B group vitamins: Secondary | ICD-10-CM | POA: Diagnosis not present

## 2022-05-24 DIAGNOSIS — E559 Vitamin D deficiency, unspecified: Secondary | ICD-10-CM | POA: Diagnosis not present

## 2022-05-24 DIAGNOSIS — G4733 Obstructive sleep apnea (adult) (pediatric): Secondary | ICD-10-CM | POA: Diagnosis not present

## 2022-06-28 ENCOUNTER — Encounter (HOSPITAL_COMMUNITY): Payer: Self-pay

## 2022-06-28 ENCOUNTER — Emergency Department (HOSPITAL_COMMUNITY): Payer: 59

## 2022-06-28 ENCOUNTER — Emergency Department (HOSPITAL_COMMUNITY)
Admission: EM | Admit: 2022-06-28 | Discharge: 2022-06-28 | Disposition: A | Discharge status: Home / Self Care | Payer: 59 | Attending: Emergency Medicine | Admitting: Emergency Medicine

## 2022-06-28 ENCOUNTER — Other Ambulatory Visit: Payer: Self-pay

## 2022-06-28 ENCOUNTER — Ambulatory Visit (HOSPITAL_BASED_OUTPATIENT_CLINIC_OR_DEPARTMENT_OTHER)
Admission: RE | Admit: 2022-06-28 | Discharge: 2022-06-28 | Disposition: A | Discharge status: Home / Self Care | Payer: 59 | Source: Ambulatory Visit | Attending: Internal Medicine | Admitting: Internal Medicine

## 2022-06-28 VITALS — BP 86/76 | HR 163 | Ht 65.0 in | Wt 210.2 lb

## 2022-06-28 DIAGNOSIS — I480 Paroxysmal atrial fibrillation: Secondary | ICD-10-CM | POA: Insufficient documentation

## 2022-06-28 DIAGNOSIS — I4891 Unspecified atrial fibrillation: Secondary | ICD-10-CM | POA: Diagnosis not present

## 2022-06-28 DIAGNOSIS — Z7901 Long term (current) use of anticoagulants: Secondary | ICD-10-CM | POA: Insufficient documentation

## 2022-06-28 DIAGNOSIS — Z79899 Other long term (current) drug therapy: Secondary | ICD-10-CM | POA: Diagnosis not present

## 2022-06-28 DIAGNOSIS — Z96652 Presence of left artificial knee joint: Secondary | ICD-10-CM | POA: Insufficient documentation

## 2022-06-28 DIAGNOSIS — I4892 Unspecified atrial flutter: Secondary | ICD-10-CM | POA: Insufficient documentation

## 2022-06-28 DIAGNOSIS — R000 Tachycardia, unspecified: Secondary | ICD-10-CM | POA: Diagnosis not present

## 2022-06-28 LAB — BASIC METABOLIC PANEL
Anion gap: 10 (ref 5–15)
BUN: 17 mg/dL (ref 6–20)
CO2: 24 mmol/L (ref 22–32)
Calcium: 9.7 mg/dL (ref 8.9–10.3)
Chloride: 105 mmol/L (ref 98–111)
Creatinine, Ser: 0.99 mg/dL (ref 0.44–1.00)
GFR, Estimated: >60 mL/min (ref >60)
Glucose, Bld: 109 mg/dL — ABNORMAL HIGH (ref 70–99)
Potassium: 4.6 mmol/L (ref 3.5–5.1)
Sodium: 139 mmol/L (ref 135–145)

## 2022-06-28 LAB — CBC
HCT: 42.7 % (ref 36.0–46.0)
Hemoglobin: 13.9 g/dL (ref 12.0–15.0)
MCH: 29.3 pg (ref 26.0–34.0)
MCHC: 32.6 g/dL (ref 30.0–36.0)
MCV: 90.1 fL (ref 80.0–100.0)
Platelets: 422 10*3/uL — ABNORMAL HIGH (ref 150–400)
RBC: 4.74 MIL/uL (ref 3.87–5.11)
RDW: 12.9 % (ref 11.5–15.5)
WBC: 9.3 10*3/uL (ref 4.0–10.5)
nRBC: 0 % (ref 0.0–0.2)

## 2022-06-28 MED ORDER — ETOMIDATE 2 MG/ML IV SOLN
10.0000 mg | Freq: Once | INTRAVENOUS | Status: AC
  Administered 2022-06-28: 8 mg via INTRAVENOUS

## 2022-06-28 NOTE — Discharge Instructions (Signed)
Tracey Lin:  Thank you for allowing [place] to take care of you today.  We hope you begin feeling better soon.  To-Do: Please follow-up with your A-fib clinic.   Please return to the Emergency Department or call 911 if you experience chest pain, shortness of breath, severe pain, severe fever, altered mental status, or have any reason to think that you need emergency medical care.  Thank you again.  Hope you feel better soon.  Department of Emergency Medicine [facility]

## 2022-06-28 NOTE — Progress Notes (Signed)
Primary Care Physician: Patrice Paradise, MD Referring Physician:  Dr. Leda Gauze is a 62 y.o. female with a h/o paroxysmal afib, sleep apnea, treated with CPAP that is in the afib clinic for f/u hospitalization for afib while in [place], [place] to attend a [place] rally in 2019. She was walking to her car and  was aware of increased HR, dizziness and lightheadedness and was taken to [facility].  She had been out in the sun for about 30 mins and her fluid intake had been less than usual  for the day. Her HR was 170 bpm. She was thought to have mild dehydration.She was admitted, placed on Cardizem drip for a couple of days then started on Multaq, ranexa, atenolol, eliquis and did convert to SR prior to discharge. The MD told pt that she would need quick f/u as multaq was for short term as it could cause liver failure. She is planning to see Dr. Johney Frame soon. She would like to discuss coming off drugs and a possible ablation down the line. CHA2DS2VASc score is 1 for female. She would like to get clearance to have her knee surgery as she wants this first so she can get back to exercising to obtain wight loss. Echo was done at [facility] and with normal results( all records can be found in Care Everywhere).   She has had afib for many years but it had been quiet, treated in the [place] area until she moved to [place] 6 years ago. She saw Dr. Darrold Junker a couple of times but has not seen him since 2015.  She was suppose to have knee surgery last week but it was cancelled.  She states that she has gained  40 lbs over the last 6 years. She does use cpap, but still sleeps very poorly. Feels that she does get at least 4 hours a night use.  F/u in afib clinic 9/9. When she saw Dr. Johney Frame, he advised her to start weaning off the ranexa, multaq and DOAC which she has successfully done. She  feels well. No further afib. She did have a SBO which self released right after I saw her last in  clinic. No further reoccurrence.   F/u in afib clinic 02/22/18, as she had a ER visit for afib and went to the ER for a cardioversion. She had multaq and eliquis on hand and took these but did not help to restore SR.  The cardioversion was successful and she is in SR today. She still would prefer to stay off daily meds . She has talked to Dr. Johney Frame about an ablation in the past but is still not quite ready for this either unless afib burden increases. She is seeing a weight loss specialist. She has not used her cpap for sleep apnea in some time, cannot tolerate mask. She thinks the trigger was lack of sleep and working long hours.   F/u in AF clinic 07/11/18. Patient reports that last night she began having feelings of SOB, weakness, and heart racing. In office today she is in afib with RVR and is very SOB, especially when walking. She has not passed out but has also been very lightheaded. She is not on Eliquis or Multaq at this time.  She was sent to the ER for urgent cardioversion.  F/u afib clinic, 07/21/18. She is s/p successful  CV and is staying in rhythm. She remains on Multaq and Eliquis but does not want to  stay on daily long term. She cannot tolerate the cpap and states that she sleeps poorly. She has stopped going to the weight loss MD as it was inconvenient for her in [place]. She has gained weight with Covid and working from home.   F/u in afib clinic, 03/30/19. She  had return of rapid afib last night with difficulty breathing and lightheadedness and asked to be seen today. She  in in afib at 138 bpm. BP soft around 100 systolic.  She is not on anticoagulation  for a CHA2DS2VASc score of 1. Last time I saw her in June she required an urgent cardioversion. Was on Multaq for a while but stopped in August 2020. She takes anticoagulation for the 4 weeks after cardioversion but then stops drug per protocol.  She did have an episode last week for a few hours of elevated HR but self converted. Discussed with  Dr. Johney Frame and he felt she should proceed to the ER for an urgent cardioversion as she is not on anticoagulation and is in the 48 hours of onset afib. Has not used cpap for OSA in past.  F/u in afib clinic, 04/13/19.  after successful cardioversion in the ER. She continues on eliquis 5 mg bid for post cardioversion protocol. Discussion today if she prefers to go back on Multaq bid to help prevent afib as she has had 2 cardioversions 6 months apart, or if she wants to discuss ablation with Dr. Johney Frame. She prefers to discuss with Dr. Johney Frame as she does not like to take meds. Will need updated echo.   F/u in afib clinic, 01/07/20. She had an appointment with Dr. Johney Frame 3 months ago but no change in approach to afib management was made. She is in SR today. No  further afib episodes to report. She is in SR today. She continues on atenolol,  CHA2DS2VASc score of 1, on daily asa and off eliquis after 4 weeks following last  cardioversion. She previously took Advertising copywriter, it worked well but she did not want to continue daily meds. She continues use of CPAP. She would prefer not to pursue ablation at this time.   F/u in afib clinic, 04/23/19. She is now one month s/p afib ablation and is very happy with the results. She has not noted any afib. No swallowing or groin issues. She is back to her usual activities. Remains on eliquis 5 mg bid for a CHA2DS2VASc score of 1.   F/u in afib clinic, 04/01/21, as she reported having 2-3 breakthrough afib episodes. The longest was 3 days. She was under some additional stress at that time. She is in SR today. Sheis not oon anticoagulation by guidelines with a CHA2DS2VASc  score of 1.   F/u in afib clinic, 12/22/21, after having to report to ED for a heart rate in afib around 180 bpm and palpable BP at 90 systolic and being symptomatic with presyncope. She was started on anticoagulation and converted with IV cardizem. She has not had any further afib. She is pending an appointment with  Dr. Lalla Brothers 12/20 to discuss second ablation. With a CHA2DS2VASc  score of 1, she wishes to stop anticoagulation since it has been one month since she chemically returned to SR, knowing if she has an ablation she will need to start it back 3 weeks prior.   On follow up 05/21/22, she is currently in SR. She is now s/p Afib ablation and ablation of an atrial tachycardia in the right atrium on 04/23/22 by  Dr. Lalla Brothers. She was on flecainide as a bridge prior to ablation and this was stopped after ablation. She went to the ED on 4/2 for acute left groin pain with no evidence of pseudoaneurysm or cellulitis and was begun on doxy and Keflex for empiric coverage. Eliquis held for 2 days due to new hematoma. Called 4/4 with pain under left breast and this was monitored. Seen by EP on 4/11 with improved groin pain and plan for no further antibiotic therapy.  Since OV on 4/11, she has been doing well. No episodes of Afib. Left groin has healed and she has no pain or fevers. Pain under left breast resolved. She is currently taking Lopressor 12.5 mg BID. No missed doses of Eliquis.   On follow up 06/28/22, she appears to be in atrial flutter with RVR. She took 1 dose of metoprolol about 2 hours prior to office visit. She can feel palpitations and short of breath with exertion. No missed doses of Eliquis.  Today, she denies symptoms of palpitations, chest pain,+ shortness of breath/lightedness, no orthopnea, PND, lower extremity edema, dizziness, presyncope, syncope, or neurologic sequela. The patient is tolerating medications without difficulties and is otherwise without complaint today.   Past Medical History:  Diagnosis Date   Anxiety    B12 deficiency    Difficult intubation    "SMALL TRACHEA"   GERD (gastroesophageal reflux disease)    History of kidney stones    H/O   OSA (obstructive sleep apnea)    USES CPAP ( rarely)   Paroxysmal atrial fibrillation (HCC)    Past Surgical History:  Procedure  Laterality Date   ATRIAL FIBRILLATION ABLATION N/A 03/25/2020   Procedure: ATRIAL FIBRILLATION ABLATION;  Surgeon: Hillis Range, MD;  [facility]: [facility];  Service: Cardiovascular;  Laterality: N/A;   ATRIAL FIBRILLATION ABLATION N/A 04/23/2022   Procedure: ATRIAL FIBRILLATION ABLATION;  Surgeon: Lanier Prude, MD;  Location: [facility];  Service: Cardiovascular;  Laterality: N/A;   AUGMENTATION MAMMAPLASTY  2003   BREAST BIOPSY Right 2005   benign   COLONOSCOPY     DILATATION & CURETTAGE/HYSTEROSCOPY WITH MYOSURE N/A 01/02/2018   Procedure: DILATATION & CURETTAGE/HYSTEROSCOPY WITH MYOSURE, polypectomy;  Surgeon: Christeen Douglas, MD;  Location: [facility];  Service: Gynecology;  Laterality: N/A;   ESOPHAGOGASTRODUODENOSCOPY     KNEE ARTHROSCOPY Left 12/02/2017   Procedure: ARTHROSCOPY KNEE WITH PARTIAL MEDIAL MENISECTOMY, REMOVAL OF LOOSE BODY;  Surgeon: Signa Kell, MD;  Location: [facility];  Service: Orthopedics;  Laterality: Left;   REDUCTION MAMMAPLASTY  2007   TUMMY TUCK  2006    Current Outpatient Medications  Medication Sig Dispense Refill   acetaminophen (TYLENOL) 500 MG tablet Take 500 mg by mouth as needed for mild pain, moderate pain or headache.     ALPRAZolam (XANAX) 0.5 MG tablet Take 0.5-1 tablets (0.25-0.5 mg total) by mouth at bedtime as needed for anxiety. 30 tablet 0   apixaban (ELIQUIS) 5 MG TABS tablet Take 1 tablet (5 mg total) by mouth 2 (two) times daily. 60 tablet 6   diphenhydramine-acetaminophen (TYLENOL PM) 25-500 MG TABS tablet Take 1 tablet by mouth at bedtime as needed.     Melatonin 10 MG CHEW Chew 20 mg by mouth daily as needed (Sleep).     metoprolol tartrate (LOPRESSOR) 25 MG tablet TAKE 1/2 TABLET (12.5 MG TOTAL) BY MOUTH 2 (TWO) TIMES DAILY. 30 tablet 2   pantoprazole (PROTONIX) 40 MG tablet Take 40 mg by mouth as needed.  No current facility-administered medications for this encounter.    Allergies  Allergen Reactions    Codeine Nausea And Vomiting   Ivp Dye [Iodinated Contrast Media] Itching    Social History   Socioeconomic History   Marital status: Divorced    Spouse name: Not on file   Number of children: Not on file   Years of education: Not on file   Highest education level: Not on file  Occupational History   Occupation: realtor  Tobacco Use   Smoking status: Never   Smokeless tobacco: Never   Tobacco comments:    Never smoke 12/22/21  Vaping Use   Vaping Use: Never used  Substance and Sexual Activity   Alcohol use: Yes    Alcohol/week: 1.0 standard drink of alcohol    Types: 1 Glasses of wine per week   Drug use: Never   Sexual activity: Not on file  Other Topics Concern   Not on file  Social History Narrative   Lives in [place]    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[employer]   Social Determinants of Health   Financial Resource Strain: Not on file  Food Insecurity: No Food Insecurity (11/18/2021)   Hunger Vital Sign    Worried About Running Out of Food in the Last Year: Never true    Ran Out of Food in the Last Year: Never true  Transportation Needs: No Transportation Needs (11/18/2021)   PRAPARE - Administrator, Civil Service (Medical): No    Lack of Transportation (Non-Medical): No  Physical Activity: Not on file  Stress: Not on file  Social Connections: Not on file  Intimate Partner Violence: Not At Risk (11/18/2021)   Humiliation, Afraid, Rape, and Kick questionnaire    Fear of Current or Ex-Partner: No    Emotionally Abused: No    Physically Abused: No    Sexually Abused: No    Family History  Problem Relation Age of Onset   CAD Mother    Heart disease Mother    Pancreatic cancer Father     ROS- All systems are reviewed and negative except as per the HPI above  Physical Exam: Vitals:   06/28/22 1333  Pulse: (!) 163  Weight: 95.3 kg  Height: 5\' 5"  (1.651 m)    Wt Readings from Last 3 Encounters:  06/28/22 95.3 kg   05/21/22 95.8 kg  05/06/22 95.8 kg    Labs: Lab Results  Component Value Date   NA 138 04/27/2022   K 3.8 04/27/2022   CL 103 04/27/2022   CO2 25 04/27/2022   GLUCOSE 165 (H) 04/27/2022   BUN 14 04/27/2022   CREATININE 0.82 04/27/2022   CALCIUM 9.0 04/27/2022   MG 2.0 01/20/2022   Lab Results  Component Value Date   INR 1.13 08/17/2017   Lab Results  Component Value Date   CHOL 172 11/19/2021   HDL 38 (L) 11/19/2021   LDLCALC 115 (H) 11/19/2021   TRIG 94 11/19/2021    GEN- The patient is well appearing, alert and oriented x 3 today.   Head- normocephalic, atraumatic Eyes-  Sclera clear, conjunctiva pink Ears- hearing intact Lungs- Clear to ausculation bilaterally, normal work of breathing Heart- Tachycardic Regular rate and rhythm, no murmurs, rubs or gallops, PMI not laterally displaced Extremities- no clubbing, cyanosis, or edema MS- no significant deformity or atrophy Skin- no rash or lesion Psych- euthymic mood, full affect Neuro- strength and sensation are intact   EKG -  Vent. rate 163 BPM PR interval 192 ms QRS duration 78 ms QT/QTcB 252/414 ms P-R-T axes 42 -23 85 Sinus tachycardia with Premature supraventricular complexes Otherwise normal ECG When compared with ECG of 21-May-2022 11:45, PREVIOUS ECG IS PRESENT  Echo 01/21/22: 1. Left ventricular ejection fraction, by estimation, is 55 to 60%. The  left ventricle has normal function. The left ventricle has no regional  wall motion abnormalities. Left ventricular diastolic parameters were  normal.   2. Right ventricular systolic function is normal. The right ventricular  size is normal.   3. The mitral valve is normal in structure. Trivial mitral valve  regurgitation. No evidence of mitral stenosis.   4. The aortic valve is normal in structure. Aortic valve regurgitation is  not visualized. No aortic stenosis is present.   5. The inferior vena cava is normal in size with greater than 50%   respiratory variability, suggesting right atrial pressure of 3 mmHg.   Assessment and Plan: 1.Symptomatic paroxysmal afib with RVR S/p ablation on 04/23/22.  She appears to be in atrial flutter with RVR today. After discussion with patient, will go down to ED to consider urgent cardioversion. She is still within the three month period s/p ablation. I will defer to Dr. Lalla Brothers whether patient requires AAD long term (restart of flecainide she was previously on) or continue with conservative observation.   F/u as scheduled with Dr. Lalla Brothers.  Lake Bells, PA-C Afib [facility] [address] [place], [place] [address] [phone]

## 2022-06-28 NOTE — ED Notes (Signed)
Pt states her friend is driving her home. 

## 2022-06-28 NOTE — ED Triage Notes (Signed)
Pt reports she woke up around 145 this morning in a fib,took at 12.5mg  of metoprolol and went back to sleep. Pt then took 25mg  metoprolol at noon today after calling the a fib clinic, Pt went to clinic at 130 and was sent down here due to rate in 150s, pt hypotensive in triage. Pt c.o feeling lightheaded and fatigued.Denies chest pain

## 2022-06-28 NOTE — ED Provider Notes (Signed)
HPI: Tracey Lin is a 62 year old female with a past medical history as below notable for A-fib on Eliquis presented today due to A-fib with RVR.  She reports she woke up acutely at 2 in the morning with palpitations.  She has monitor: At home which suggest that she was in A-fib.  She notes that she takes Eliquis for A-fib and has been completely compliant with this.  She last took it this morning.  She also took 12.5 mg of metoprolol when she woke up with the symptoms before going back to sleep.  She woke up with persistent symptoms.  She took another 25 mg of metoprolol at noon and then called the A-fib clinic.  She went into the clinic and was sent here due to soft blood pressures with her symptoms.  She reports she has been fully with it during all of these episodes and when she was told she was hypotensive she was ambulatory.  She denies chest pain but endorses mild shortness of breath, some lightheadedness, and palpitations.  She reports multiple episodes of similar symptoms and has had multiple cardioversions.  She endorses a recent ablation in March.  Past Medical History:  Diagnosis Date   Anxiety    B12 deficiency    Difficult intubation    "SMALL TRACHEA"   GERD (gastroesophageal reflux disease)    History of kidney stones    H/O   OSA (obstructive sleep apnea)    USES CPAP ( rarely)   Paroxysmal atrial fibrillation (HCC)     Past Surgical History:  Procedure Laterality Date   ATRIAL FIBRILLATION ABLATION N/A 03/25/2020   Procedure: ATRIAL FIBRILLATION ABLATION;  Surgeon: Hillis Range, MD;  [facility]: [facility];  Service: Cardiovascular;  Laterality: N/A;   ATRIAL FIBRILLATION ABLATION N/A 04/23/2022   Procedure: ATRIAL FIBRILLATION ABLATION;  Surgeon: Lanier Prude, MD;  [facility]: [facility];  Service: Cardiovascular;  Laterality: N/A;   AUGMENTATION MAMMAPLASTY  2003   BREAST BIOPSY Right 2005   benign   COLONOSCOPY     DILATATION &  CURETTAGE/HYSTEROSCOPY WITH MYOSURE N/A 01/02/2018   Procedure: DILATATION & CURETTAGE/HYSTEROSCOPY WITH MYOSURE, polypectomy;  Surgeon: Christeen Douglas, MD;  Location: [facility];  Service: Gynecology;  Laterality: N/A;   ESOPHAGOGASTRODUODENOSCOPY     KNEE ARTHROSCOPY Left 12/02/2017   Procedure: ARTHROSCOPY KNEE WITH PARTIAL MEDIAL MENISECTOMY, REMOVAL OF LOOSE BODY;  Surgeon: Signa Kell, MD;  Location: [facility];  Service: Orthopedics;  Laterality: Left;   REDUCTION MAMMAPLASTY  2007   TUMMY TUCK  2006     Social History   Tobacco Use   Smoking status: Never   Smokeless tobacco: Never   Tobacco comments:    Never smoke 12/22/21  Vaping Use   Vaping Use: Never used  Substance Use Topics   Alcohol use: Yes    Alcohol/week: 1.0 standard drink of alcohol    Types: 1 Glasses of wine per week   Drug use: Never      Review of Systems  A complete ROS was performed with pertinent positives/negatives noted in the HPI.   Vitals:   06/28/22 1625 06/28/22 1630  BP: 127/84 114/71  Pulse: 76 74  Resp: 12 17  Temp:    SpO2: 100% 100%    Physical Exam Vitals and nursing note reviewed.  Constitutional:      General: She is not in acute distress.    Appearance: She is well-developed.  HENT:     Head: Normocephalic and atraumatic.  Eyes:     Conjunctiva/sclera: Conjunctivae normal.  Cardiovascular:     Rate and Rhythm: Tachycardia present. Rhythm irregular.     Pulses: Normal pulses.     Heart sounds: Normal heart sounds. No murmur heard.    No friction rub. No gallop.  Pulmonary:     Effort: Pulmonary effort is normal. No respiratory distress.     Breath sounds: Normal breath sounds. No stridor. No wheezing, rhonchi or rales.  Chest:     Chest wall: No tenderness.  Abdominal:     General: Abdomen is flat. There is no distension.     Palpations: Abdomen is soft. There is no mass.     Tenderness: There is no abdominal tenderness. There is no right CVA tenderness or guarding.   Musculoskeletal:        General: No swelling.  Skin:    General: Skin is warm and dry.     Capillary Refill: Capillary refill takes less than 2 seconds.  Neurological:     Mental Status: She is alert.  Psychiatric:        Mood and Affect: Mood normal.     Procedures  MDM:  EKG results: ECG on my interpretation is A-fib with RVR, without anatomical ischemia representing STEMI or ischemic equivalent.   Lab results:  Results for orders placed or performed during the hospital encounter of 06/28/22 (from the past 24 hour(s))  Basic metabolic panel   Collection Time: 06/28/22  2:43 PM  Result Value Ref Range   Sodium 139 135 - 145 mmol/L   Potassium 4.6 3.5 - 5.1 mmol/L   Chloride 105 98 - 111 mmol/L   CO2 24 22 - 32 mmol/L   Glucose, Bld 109 (H) 70 - 99 mg/dL   BUN 17 6 - 20 mg/dL   Creatinine, Ser 4.54 0.44 - 1.00 mg/dL   Calcium 9.7 8.9 - 09.8 mg/dL   GFR, Estimated >11 >91 mL/min   Anion gap 10 5 - 15  CBC   Collection Time: 06/28/22  2:43 PM  Result Value Ref Range   WBC 9.3 4.0 - 10.5 K/uL   RBC 4.74 3.87 - 5.11 MIL/uL   Hemoglobin 13.9 12.0 - 15.0 g/dL   HCT 47.8 29.5 - 62.1 %   MCV 90.1 80.0 - 100.0 fL   MCH 29.3 26.0 - 34.0 pg   MCHC 32.6 30.0 - 36.0 g/dL   RDW 30.8 65.7 - 84.6 %   Platelets 422 (H) 150 - 400 K/uL   nRBC 0.0 0.0 - 0.2 %    Key medications administered in the ER:  Medications  etomidate (AMIDATE) injection 10 mg (8 mg Intravenous Given 06/28/22 1620)   Medical decision making: -Vital signs show initial tachycardia with hypotension that improved after fluids.  Patient mentating appropriately.  Monitor review demonstrates heart rates ranging from 140s to 160s. -Patient's presentation is most consistent with acute presentation with potential threat to life or bodily function. -Tracey Lin is a 62 y.o. female presenting to the emergency department with A-fib with RVR.  -Additional history obtained from *** -Per chart review, ***    The  plan for this patient was discussed with Dr. Rhunette Croft, who voiced agreement and who oversaw evaluation and treatment of this patient.  Marta Lamas, MD Emergency Medicine, PGY-3  Note: Dragon medical dictation software was used in the creation of this note.   Clinical Impression: No diagnosis found.  Data Unavailable

## 2022-06-28 NOTE — ED Notes (Signed)
Pt ate and drank well 

## 2022-06-30 ENCOUNTER — Telehealth: Payer: Self-pay | Admitting: Cardiology

## 2022-06-30 MED ORDER — METOPROLOL TARTRATE 25 MG PO TABS: 12.5000 mg | ORAL_TABLET | Freq: Two times a day (BID) | ORAL | 2 refills | Status: DC

## 2022-06-30 NOTE — Telephone Encounter (Signed)
*  STAT* If patient is at the pharmacy, call can be transferred to refill team.   1. Which medications need to be refilled? (please list name of each medication and dose if known)   metoprolol tartrate (LOPRESSOR) 25 MG tablet    2. Which pharmacy/location (including street and city if local pharmacy) is medication to be sent to?[id number] - [place], [place] - [address]   3. Do they need a 30 day or 90 day supply? 90 day

## 2022-06-30 NOTE — Telephone Encounter (Signed)
Medication has been refilled.

## 2022-06-30 NOTE — Telephone Encounter (Signed)
Please advise if ok to refill Metoprolol Tartrate last filled by   Date: 03/18/2022 Department: [facility] Ordering: Shona Simpson, RN Authorizing: Newman Nip, NP

## 2022-07-21 ENCOUNTER — Ambulatory Visit: Payer: 59 | Admitting: Cardiology

## 2022-07-22 ENCOUNTER — Encounter: Payer: Self-pay | Admitting: Cardiology

## 2022-07-22 ENCOUNTER — Ambulatory Visit: Payer: 59 | Attending: Cardiology | Admitting: Cardiology

## 2022-07-22 VITALS — BP 120/98 | HR 74 | Ht 65.0 in | Wt 211.0 lb

## 2022-07-22 DIAGNOSIS — I480 Paroxysmal atrial fibrillation: Secondary | ICD-10-CM | POA: Diagnosis not present

## 2022-07-22 DIAGNOSIS — I4719 Other supraventricular tachycardia: Secondary | ICD-10-CM

## 2022-07-22 MED ORDER — METOPROLOL SUCCINATE ER 25 MG PO TB24: 25.0000 mg | ORAL_TABLET | Freq: Every evening | ORAL | 3 refills | Status: DC

## 2022-07-22 NOTE — Progress Notes (Signed)
  Electrophysiology Office Follow up Visit Note:    Date:  07/22/2022   ID:  Tracey Lin, DOB 1960/12/21, MRN [id number]  PCP:  Patrice Paradise, MD  [facility] Cardiologist:  Rudi Coco, NP (Inactive)  [facility] Electrophysiologist:  Lanier Prude, MD    Interval History:    Tracey Lin is a 62 y.o. female who presents for a follow up visit.   She had a prior ablation March 25, 2020 with a redo catheter ablation April 23, 2022. During the redo ablation the left superior pulmonary vein was reisolated.  There was an atrial tachycardia also targeted originating from the tricuspid annulus in the right atrium.  She has had 1 episode of atrial fibrillation on June 28, 2022 that lasted for hours.  She was symptomatic.  Since leaving the emergency department she has not had another episode.      Past medical, surgical, social and family history were reviewed.  ROS:   Please see the history of present illness.    All other systems reviewed and are negative.  EKGs/Labs/Other Studies Reviewed:    The following studies were reviewed today:    EKG Interpretation Date/Time:  Thursday July 22 2022 16:09:16 EDT Ventricular Rate:  74 PR Interval:  140 QRS Duration:  78 QT Interval:  410 QTC Calculation: 455 R Axis:   -3  Text Interpretation: Normal sinus rhythm Normal ECG Confirmed by Steffanie Dunn [phone]) on 07/22/2022 7:54:48 PM    Physical Exam:    VS:  BP (!) 120/98   Pulse 74   Ht 5\' 5"  (1.651 m)   Wt 211 lb (95.7 kg)   LMP 08/09/2011 (Approximate) Comment: PT STILL SPOTS AS OF 08-08-17  SpO2 98%   BMI 35.11 kg/m     Wt Readings from Last 3 Encounters:  07/22/22 211 lb (95.7 kg)  06/28/22 210 lb (95.3 kg)  06/28/22 210 lb 3.2 oz (95.3 kg)     GEN:  Well nourished, well developed in no acute distress CARDIAC: RRR, no murmurs, rubs, gallops RESPIRATORY:  Clear to auscultation without rales, wheezing or rhonchi       ASSESSMENT:     1. Paroxysmal atrial fibrillation (HCC)    PLAN:    In order of problems listed above:  #Atrial fibrillation #Atrial tachycardia Symptomatic.  Had 1 sustained episode since her redo catheter ablation.  We discussed treatment options today including continuing with the current strategy and watching/waiting for recurrence.  We discussed adding an antiarrhythmic drug such as dofetilide.  We also discussed redo catheter ablation.  If she were to have sustained recurrence moving forward, would favor initiating dofetilide.  I did discuss that dofetilide loading process in detail with the patient including the need for inpatient hospitalization and the associated risks.  If she were to have an episode and contact the office I would get Tikosyn admission arranged.  I have encouraged her to continue taking Eliquis twice daily for stroke prophylaxis.       Signed, Steffanie Dunn, MD, [facility], [facility] 07/22/2022 7:55 PM    Electrophysiology [facility]

## 2022-07-22 NOTE — Progress Notes (Signed)
12  

## 2022-07-22 NOTE — Patient Instructions (Signed)
Medication Instructions:  Your physician has recommended you make the following change in your medication:  1) STOP taking metoprolol tartrate 2) START taking metoprolol succinate (Toprol XL) 25 mg every night *If you need a refill on your cardiac medications before your next appointment, please call your pharmacy*   Follow-Up: At [facility], you and your health needs are our priority.  As part of our continuing mission to provide you with exceptional heart care, we have created designated Provider Care Teams.  These Care Teams include your primary Cardiologist (physician) and Advanced Practice Providers (APPs -  Physician Assistants and Nurse Practitioners) who all work together to provide you with the care you need, when you need it.  Your next appointment:   6 months  Provider:   Sherie Don, NP

## 2022-08-24 ENCOUNTER — Telehealth: Payer: Self-pay | Admitting: Cardiology

## 2022-08-24 ENCOUNTER — Other Ambulatory Visit: Payer: Self-pay | Admitting: *Deleted

## 2022-08-24 ENCOUNTER — Other Ambulatory Visit: Payer: Self-pay | Admitting: Cardiology

## 2022-08-24 MED ORDER — METOPROLOL SUCCINATE ER 25 MG PO TB24: 25.0000 mg | ORAL_TABLET | Freq: Every evening | ORAL | 3 refills | Status: DC

## 2022-08-24 NOTE — Telephone Encounter (Signed)
Pt c/o medication issue:  1. Name of Medication:   metoprolol succinate (TOPROL XL) 25 MG 24 hr tablet    2. How are you currently taking this medication (dosage and times per day)?  Take 1 tablet (25 mg total) by mouth at bedtime.       3. Are you having a reaction (difficulty breathing--STAT)? No  4. What is your medication issue? Pt is requesting a callback regarding a mix up happening at the pharmacy and her not being able to get her medication. Please advise

## 2022-08-24 NOTE — Telephone Encounter (Signed)
Left message for patient to call back  

## 2022-08-24 NOTE — Telephone Encounter (Signed)
*  STAT* If patient is at the pharmacy, call can be transferred to refill team.   1. Which medications need to be refilled? (please list name of each medication and dose if known) metoprolol succinate (TOPROL XL) 25 MG 24 hr tablet    2. Would you like to learn more about the convenience, safety, & potential cost savings by using the [facility]?   3. Are you open to using the [facility] (Type [facility]   4. Which pharmacy/location (including street and city if local pharmacy) is medication to be sent to?  [id number] - [place], [place] - [address]     5. Do they need a 30 day or 90 day supply? [id number]  Pharmacy states they never received script back in June, patient is out of medication.

## 2022-08-24 NOTE — Telephone Encounter (Signed)
Spoke with [facility] at [facility], confirmed they received Rx for Toprol XL 25mg  every night. No further needs voiced.

## 2022-08-25 NOTE — Telephone Encounter (Signed)
See above phone note. Medication issue has been resolved.

## 2022-09-27 ENCOUNTER — Emergency Department (HOSPITAL_COMMUNITY)
Admission: EM | Admit: 2022-09-27 | Discharge: 2022-09-27 | Disposition: A | Discharge status: Home / Self Care | Payer: 59 | Attending: Emergency Medicine | Admitting: Emergency Medicine

## 2022-09-27 ENCOUNTER — Encounter (HOSPITAL_COMMUNITY): Payer: Self-pay

## 2022-09-27 ENCOUNTER — Other Ambulatory Visit: Payer: Self-pay

## 2022-09-27 ENCOUNTER — Emergency Department (HOSPITAL_COMMUNITY): Payer: 59

## 2022-09-27 DIAGNOSIS — I480 Paroxysmal atrial fibrillation: Secondary | ICD-10-CM | POA: Diagnosis not present

## 2022-09-27 DIAGNOSIS — R0602 Shortness of breath: Secondary | ICD-10-CM | POA: Diagnosis not present

## 2022-09-27 DIAGNOSIS — Z7901 Long term (current) use of anticoagulants: Secondary | ICD-10-CM | POA: Diagnosis not present

## 2022-09-27 LAB — CBC WITH DIFFERENTIAL/PLATELET
Abs Immature Granulocytes: 0.03 10*3/uL (ref 0.00–0.07)
Basophils Absolute: 0 10*3/uL (ref 0.0–0.1)
Basophils Relative: 0 %
Eosinophils Absolute: 0.1 10*3/uL (ref 0.0–0.5)
Eosinophils Relative: 2 %
HCT: 44.9 % (ref 36.0–46.0)
Hemoglobin: 14.3 g/dL (ref 12.0–15.0)
Immature Granulocytes: 0 %
Lymphocytes Relative: 35 %
Lymphs Abs: 3.3 10*3/uL (ref 0.7–4.0)
MCH: 28.9 pg (ref 26.0–34.0)
MCHC: 31.8 g/dL (ref 30.0–36.0)
MCV: 90.7 fL (ref 80.0–100.0)
Monocytes Absolute: 0.7 10*3/uL (ref 0.1–1.0)
Monocytes Relative: 7 %
Neutro Abs: 5.2 10*3/uL (ref 1.7–7.7)
Neutrophils Relative %: 56 %
Platelets: 374 10*3/uL (ref 150–400)
RBC: 4.95 MIL/uL (ref 3.87–5.11)
RDW: 12.8 % (ref 11.5–15.5)
WBC: 9.3 10*3/uL (ref 4.0–10.5)
nRBC: 0 % (ref 0.0–0.2)

## 2022-09-27 LAB — BASIC METABOLIC PANEL
Anion gap: 12 (ref 5–15)
BUN: 12 mg/dL (ref 6–20)
CO2: 21 mmol/L — ABNORMAL LOW (ref 22–32)
Calcium: 8.8 mg/dL — ABNORMAL LOW (ref 8.9–10.3)
Chloride: 103 mmol/L (ref 98–111)
Creatinine, Ser: 0.83 mg/dL (ref 0.44–1.00)
GFR, Estimated: >60 mL/min (ref >60)
Glucose, Bld: 110 mg/dL — ABNORMAL HIGH (ref 70–99)
Potassium: 4.3 mmol/L (ref 3.5–5.1)
Sodium: 136 mmol/L (ref 135–145)

## 2022-09-27 LAB — BRAIN NATRIURETIC PEPTIDE: B Natriuretic Peptide: 479 pg/mL — ABNORMAL HIGH (ref 0.0–100.0)

## 2022-09-27 MED ORDER — ADENOSINE 6 MG/2ML IV SOLN
6.0000 mg | Freq: Once | INTRAVENOUS | Status: DC
  Filled 2022-09-27: qty 2

## 2022-09-27 MED ORDER — SODIUM CHLORIDE 0.9 % IV BOLUS
1000.0000 mL | Freq: Once | INTRAVENOUS | Status: AC
  Administered 2022-09-27: 1000 mL via INTRAVENOUS

## 2022-09-27 MED ORDER — ADENOSINE 6 MG/2ML IV SOLN
INTRAVENOUS | Status: AC | PRN
  Administered 2022-09-27: 6 mg via INTRAVENOUS

## 2022-09-27 MED ORDER — ETOMIDATE 2 MG/ML IV SOLN
INTRAVENOUS | Status: AC | PRN
  Administered 2022-09-27: 10 mg via INTRAVENOUS

## 2022-09-27 MED ORDER — ETOMIDATE 2 MG/ML IV SOLN
10.0000 mg | Freq: Once | INTRAVENOUS | Status: DC
  Filled 2022-09-27: qty 10

## 2022-09-27 NOTE — ED Provider Notes (Signed)
[place] EMERGENCY DEPARTMENT AT [facility] Provider Note   CSN: [id number] Arrival date & time: 09/27/22  1147     History PAF, GERD, GERD Chief Complaint  Patient presents with   Shortness of Breath   Chest Pain   Dizziness    Tracey Lin is a 62 y.o. female.  62 y.o female with a PMH of PAF, GERD presents to the ED with a chief complaint of shortness of breath, lightheaded which began around 3 AM this morning.  Patient reports she noted her heart racing, feeling lightheaded and somewhat weak.  She has had a prior history of A-fib and has multiple rounds of cardioversions, also had an ablation last in March 2024.  She does not have any prior history of MIs.  She is also describing a tingling sensation to her left hand, feeling somewhat numb but not weak.  She has taken her morning medication including her Eliquis.  She is followed closely by her cardiology team.  Denies any fever, chest pain or shortness of breath.   The history is provided by the patient and medical records.  Shortness of Breath Associated symptoms: chest pain   Associated symptoms: no abdominal pain, no fever and no vomiting   Chest Pain Associated symptoms: dizziness, palpitations and shortness of breath   Associated symptoms: no abdominal pain, no fever, no nausea and no vomiting   Dizziness Associated symptoms: chest pain, palpitations and shortness of breath   Associated symptoms: no nausea and no vomiting        Home Medications Prior to Admission medications   Medication Sig Start Date End Date Taking? Authorizing Provider  acetaminophen (TYLENOL) 500 MG tablet Take 500 mg by mouth as needed for mild pain, moderate pain or headache.    [provider]  ALPRAZolam Prudy Feeler) 0.5 MG tablet Take 0.5-1 tablets (0.25-0.5 mg total) by mouth at bedtime as needed for anxiety. 08/15/18   Dohmeier, Porfirio Mylar, MD  apixaban (ELIQUIS) 5 MG TABS tablet Take 1 tablet (5 mg total) by mouth 2 (two)  times daily. 02/24/22   Newman Nip, NP  diphenhydramine-acetaminophen (TYLENOL PM) 25-500 MG TABS tablet Take 1 tablet by mouth at bedtime as needed.    [provider]  Melatonin 10 MG CHEW Chew 20 mg by mouth daily as needed (Sleep).    [provider]  metoprolol succinate (TOPROL XL) 25 MG 24 hr tablet Take 1 tablet (25 mg total) by mouth at bedtime. 08/24/22   Lanier Prude, MD  pantoprazole (PROTONIX) 40 MG tablet Take 40 mg by mouth as needed.    [provider]      Allergies    Codeine and Ivp dye [iodinated contrast media]    Review of Systems   Review of Systems  Constitutional:  Negative for chills and fever.  Respiratory:  Positive for shortness of breath.   Cardiovascular:  Positive for chest pain and palpitations. Negative for leg swelling.  Gastrointestinal:  Negative for abdominal pain, nausea and vomiting.  Neurological:  Positive for dizziness.  All other systems reviewed and are negative.   Physical Exam Updated Vital Signs BP 130/78   Pulse 67   Temp 97.8 F (36.6 C)   Resp 12   Ht 5\' 5"  (1.651 m)   Wt 90.7 kg   LMP 08/09/2011 (Approximate) Comment: PT STILL SPOTS AS OF 08-08-17  SpO2 100%   BMI 33.28 kg/m  Physical Exam Vitals and nursing note reviewed.  Constitutional:  Appearance: She is well-developed.  HENT:     Head: Normocephalic and atraumatic.  Cardiovascular:     Rate and Rhythm: Tachycardia present.  Pulmonary:     Effort: Pulmonary effort is normal.     Breath sounds: No decreased breath sounds or rales.  Chest:     Chest wall: No tenderness.  Abdominal:     Palpations: Abdomen is soft.  Musculoskeletal:     Cervical back: Normal range of motion and neck supple.     Right lower leg: No edema.     Left lower leg: No edema.  Skin:    General: Skin is warm and dry.  Neurological:     Mental Status: She is alert and oriented to person, place, and time.     ED Results / Procedures /  Treatments   Labs (all labs ordered are listed, but only abnormal results are displayed) Labs Reviewed  BASIC METABOLIC PANEL - Abnormal; Notable for the following components:      Result Value   CO2 21 (*)    Glucose, Bld 110 (*)    Calcium 8.8 (*)    All other components within normal limits  BRAIN NATRIURETIC PEPTIDE - Abnormal; Notable for the following components:   B Natriuretic Peptide 479.0 (*)    All other components within normal limits  CBC WITH DIFFERENTIAL/PLATELET    EKG EKG Interpretation Date/Time:  Monday September 27 2022 12:06:31 EDT Ventricular Rate:  166 PR Interval:  76 QRS Duration:  79 QT Interval:  296 QTC Calculation: 492 R Axis:   4  Text Interpretation: Supraventricular tachycardia Probable LVH with secondary repol abnrm Confirmed by Tanda Rockers ([id number]) on 09/27/2022 12:21:52 PM  Radiology DG Chest Port 1 View  Result Date: 09/27/2022 CLINICAL DATA:  62 year old female with history of shortness of breath. EXAM: PORTABLE CHEST 1 VIEW COMPARISON:  Chest x-ray 06/28/2022. FINDINGS: Lung volumes are normal. No consolidative airspace disease. No pleural effusions. No pneumothorax. No pulmonary nodule or mass noted. Pulmonary vasculature and the cardiomediastinal silhouette are within normal limits. IMPRESSION: No radiographic evidence of acute cardiopulmonary disease. Electronically Signed   By: Trudie Reed M.D.   On: 09/27/2022 13:13    Procedures Procedures    Medications Ordered in ED Medications  adenosine (ADENOCARD) 6 MG/2ML injection 6 mg (6 mg Intravenous Not Given 09/27/22 1311)  etomidate (AMIDATE) injection 10 mg (10 mg Intravenous Not Given 09/27/22 1324)  sodium chloride 0.9 % bolus 1,000 mL (0 mLs Intravenous Stopped 09/27/22 1454)  adenosine (ADENOCARD) 6 MG/2ML injection (6 mg Intravenous Given 09/27/22 1310)  etomidate (AMIDATE) injection (10 mg Intravenous Given 09/27/22 1319)    ED Course/ Medical Decision Making/ A&P Clinical Course as  of 09/27/22 1509  Mon Sep 27, 2022  1500 B Natriuretic Peptide(!): 479.0 Does not appear fluid overloaded on exam.  [JS]    Clinical Course User Index [JS] Claude Manges, PA-C                                 Medical Decision Making Amount and/or Complexity of Data Reviewed Labs: ordered. Radiology: ordered.  Risk Prescription drug management.   This patient presents to the ED for concern of palpitations, this involves a number of treatment options, and is a complaint that carries with it a high risk of complications and morbidity.  The differential diagnosis includes Afib, SVT versus CHF exacerbation.   Co morbidities: Discussed in HPI  Brief History:   See HPI.   EMR reviewed including pt PMHx, past surgical history and past visits to ER.   See HPI for more details   Lab Tests:  I ordered and independently interpreted labs.  The pertinent results include:    I personally reviewed all laboratory work and imaging. Metabolic panel without any acute abnormality specifically kidney function within normal limits and no significant electrolyte abnormalities. CBC without leukocytosis or significant anemia. BNP is elevated but does not appear fluid overloaded.   Imaging Studies:  NAD. I personally reviewed all imaging studies and no acute abnormality found. I agree with radiology interpretation.  Cardiac Monitoring:  The patient was maintained on a cardiac monitor.  I personally viewed and interpreted the cardiac monitored which showed an underlying rhythm of: Supraventricular tachycardia HR 166 EKG non-ischemic   Medicines ordered:  I ordered medication including bolus  for symptomatic treatment.  Reevaluation of the patient after these medicines showed that the patient improved I have reviewed the patients home medicines and have made adjustments as needed  Critical Interventions:   patient required cardioversion after unsuccessfully provided with adenosine 6  mg.  Reevaluation:  After the interventions noted above I re-evaluated patient and found that they have :improved   Social Determinants of Health:  The patient's social determinants of health were a factor in the care of this patient   Problem List / ED Course:  Patient presents to the ED with a chief complaint of palpitations which began around 3 AM this morning, underlying history of ablations, prior cardioversions.  Her last ablation was in March 2024, she is followed closely by cardiology and anticoagulated on Eliquis, she did have her Eliquis this morning.  Reports compliance with her anticoagulation medication.  Labs are within normal limits, BNP is slightly elevated she does have some shortness of breath, however does not appear volume overloaded on exam.  She did come into the emergency room with an EKG concerning for SVT, given adenosine 6 mg, this did not successfully break the adenosine, I think this is likely unmask the A-fib, she does have a history of paroxysmal A-fib anticoagulated on Eliquis.  We provided her with a new procedural sedation, provided with etomidate, had already a version performed ending Dr. Wallace Cullens, patient tolerated the procedure successfully.  She had her rate converted from A-fib to normal sinus rhythm with a heart rate in the 70s, has been observed in the emergency department for approximately 2 hours after the procedure.  She is accompanied by her son and daughter-in-law who reports she is hungry at this time.  She has maintained her vitals within normal limits.  We discussed appropriate follow-up with her cardiologist.  Patient is hemodynamically stable for discharge.  Dispostion:  After consideration of the diagnostic results and the patients response to treatment, I feel that the patent would benefit from follow up with your cardiologist.    Portions of this note were generated with Dragon dictation software. Dictation errors may occur despite best attempts  at proofreading.   Final Clinical Impression(s) / ED Diagnoses Final diagnoses:  Paroxysmal atrial fibrillation [facility])    Rx / DC Orders ED Discharge Orders     None         Claude Manges, PA-C 09/27/22 1509    Sloan Leiter, DO 09/27/22 1613    Sloan Leiter, DO 09/27/22 1621

## 2022-09-27 NOTE — ED Notes (Signed)
Informed consent signed by patient and this RN

## 2022-09-27 NOTE — ED Notes (Signed)
Patient placed on zoll pads 

## 2022-09-27 NOTE — ED Notes (Signed)
Patient signed consent for sedation and cardioversion along with this RN

## 2022-09-27 NOTE — Discharge Instructions (Addendum)
Please follow up with your cardiologist at your earliest convenience.

## 2022-09-27 NOTE — ED Provider Notes (Signed)
.Cardioversion  Date/Time: 09/27/2022 2:41 PM  Performed by: Sloan Leiter, DO Authorized by: Sloan Leiter, DO   Consent:    Consent obtained:  Verbal and written   Consent given by:  Patient   Risks discussed:  Cutaneous burn, death, induced arrhythmia and pain   Alternatives discussed:  No treatment and rate-control medication Pre-procedure details:    Cardioversion basis:  Elective   Rhythm:  Atrial fibrillation   Electrode placement:  Anterior-posterior Patient sedated: Yes. Refer to sedation procedure documentation for details of sedation.  Attempt one:    Cardioversion mode:  Synchronous   Shock (Joules):  150   Shock outcome:  Conversion to normal sinus rhythm Post-procedure details:    Patient status:  Awake   Patient tolerance of procedure:  Tolerated well, no immediate complications .Sedation  Date/Time: 09/27/2022 4:17 PM  Performed by: Sloan Leiter, DO Authorized by: Sloan Leiter, DO   Consent:    Consent obtained:  Verbal and written   Consent given by:  Patient   Risks discussed:  Allergic reaction, prolonged hypoxia resulting in organ damage, dysrhythmia and prolonged sedation necessitating reversal   Alternatives discussed:  Analgesia without sedation Universal protocol:    Procedure explained and questions answered to patient or proxy's satisfaction: yes     Immediately prior to procedure, a time out was called: yes     Patient identity confirmed:  Arm band and verbally with patient Indications:    Procedure performed:  Cardioversion   Procedure necessitating sedation performed by:  Physician performing sedation Pre-sedation assessment:    Time since last food or drink:  12 hours   ASA classification: class 1 - normal, healthy patient     Mouth opening:  3 or more finger widths   Mallampati score:  I - soft palate, uvula, fauces, pillars visible   Neck mobility: normal     Pre-sedation assessments completed and reviewed: airway patency,  cardiovascular function, hydration status, mental status, nausea/vomiting, pain level, respiratory function and temperature     History of difficult intubation: yes     Pre-sedation assessment completed:  09/27/2022 1:14 PM Immediate pre-procedure details:    Reassessment: Patient reassessed immediately prior to procedure     Reviewed: vital signs, relevant labs/tests and NPO status     Verified: bag valve mask available, emergency equipment available, intubation equipment available, IV patency confirmed, oxygen available, reversal medications available and suction available   Procedure details (see MAR for exact dosages):    Preoxygenation:  Nasal cannula   Sedation:  Etomidate   Intended level of sedation: deep   Intra-procedure monitoring:  Blood pressure monitoring, continuous capnometry, frequent LOC assessments, frequent vital sign checks, continuous pulse oximetry and cardiac monitor   Intra-procedure events: respiratory depression     Intra-procedure management:  Airway repositioning   Total Provider sedation time (minutes):  13 Post-procedure details:    Post-sedation assessment completed:  09/27/2022 1:29 PM   Attendance: Constant attendance by certified staff until patient recovered     Recovery: Patient returned to pre-procedure baseline     Post-sedation assessments completed and reviewed: airway patency, cardiovascular function, mental status, nausea/vomiting, pain level, respiratory function and temperature     Post-sedation assessments completed and reviewed: post-procedure hydration status not reviewed     Patient is stable for discharge or admission: yes     Procedure completion:  Tolerated well, no immediate complications Comments:     Pt with brief period of respiratory depression, underlying OSA.  Head was repositioned and respiratory status normalized         Sloan Leiter, DO 09/27/22 1620

## 2022-09-27 NOTE — ED Triage Notes (Signed)
Pt reports sob, dizziness, chest tightness and left arm tingling since 3am. Hx of a fib with multiple cardioversions and 2 ablations.

## 2022-09-27 NOTE — ED Notes (Signed)
Pt reports she's had two ablasions.

## 2022-09-27 NOTE — ED Notes (Signed)
Patient placed on CO2 monitoring

## 2022-09-28 ENCOUNTER — Telehealth (HOSPITAL_COMMUNITY): Payer: Self-pay

## 2022-09-28 NOTE — Telephone Encounter (Signed)
Left message for patient to call back to schedule ED follow up appointment. ?

## 2022-10-06 DIAGNOSIS — G4733 Obstructive sleep apnea (adult) (pediatric): Secondary | ICD-10-CM | POA: Diagnosis not present

## 2022-10-06 DIAGNOSIS — M25561 Pain in right knee: Secondary | ICD-10-CM | POA: Diagnosis not present

## 2022-10-06 DIAGNOSIS — I480 Paroxysmal atrial fibrillation: Secondary | ICD-10-CM | POA: Diagnosis not present

## 2022-10-13 ENCOUNTER — Ambulatory Visit (HOSPITAL_COMMUNITY): Payer: 59 | Admitting: Internal Medicine

## 2022-10-19 ENCOUNTER — Ambulatory Visit (HOSPITAL_COMMUNITY): Payer: 59 | Admitting: Internal Medicine

## 2022-10-22 ENCOUNTER — Ambulatory Visit (HOSPITAL_COMMUNITY): Payer: 59 | Admitting: Internal Medicine

## 2022-11-10 ENCOUNTER — Ambulatory Visit (HOSPITAL_COMMUNITY)
Admission: RE | Admit: 2022-11-10 | Discharge: 2022-11-10 | Disposition: A | Discharge status: Home / Self Care | Payer: 59 | Source: Ambulatory Visit | Attending: Internal Medicine | Admitting: Internal Medicine

## 2022-11-10 VITALS — BP 150/96 | HR 73 | Ht 65.0 in | Wt 212.6 lb

## 2022-11-10 DIAGNOSIS — G4730 Sleep apnea, unspecified: Secondary | ICD-10-CM | POA: Insufficient documentation

## 2022-11-10 DIAGNOSIS — I4891 Unspecified atrial fibrillation: Secondary | ICD-10-CM | POA: Diagnosis not present

## 2022-11-10 DIAGNOSIS — I480 Paroxysmal atrial fibrillation: Secondary | ICD-10-CM | POA: Insufficient documentation

## 2022-11-10 DIAGNOSIS — R9431 Abnormal electrocardiogram [ECG] [EKG]: Secondary | ICD-10-CM | POA: Diagnosis not present

## 2022-11-10 NOTE — Patient Instructions (Addendum)
Stop Eliquis

## 2022-11-10 NOTE — Progress Notes (Addendum)
Primary Care Physician: Patrice Paradise, MD Referring Physician:  Dr. Leda Gauze is a 62 y.o. female with a h/o paroxysmal afib, sleep apnea, treated with CPAP that is in the afib clinic for f/u hospitalization for afib while in [place], [place] to attend a [place] rally in 2019. She was walking to her car and  was aware of increased HR, dizziness and lightheadedness and was taken to [facility].  She had been out in the sun for about 30 mins and her fluid intake had been less than usual  for the day. Her HR was 170 bpm. She was thought to have mild dehydration.She was admitted, placed on Cardizem drip for a couple of days then started on Multaq, ranexa, atenolol, eliquis and did convert to SR prior to discharge. The MD told pt that she would need quick f/u as multaq was for short term as it could cause liver failure. She is planning to see Dr. Johney Frame soon. She would like to discuss coming off drugs and a possible ablation down the line. CHA2DS2VASc score is 1 for female. She would like to get clearance to have her knee surgery as she wants this first so she can get back to exercising to obtain wight loss. Echo was done at [facility] and with normal results( all records can be found in Care Everywhere).   She has had afib for many years but it had been quiet, treated in the [place] area until she moved to [place] 6 years ago. She saw Dr. Darrold Junker a couple of times but has not seen him since 2015.  She was suppose to have knee surgery last week but it was cancelled.  She states that she has gained  40 lbs over the last 6 years. She does use cpap, but still sleeps very poorly. Feels that she does get at least 4 hours a night use.  F/u in afib clinic 9/9. When she saw Dr. Johney Frame, he advised her to start weaning off the ranexa, multaq and DOAC which she has successfully done. She  feels well. No further afib. She did have a SBO which self released right after I saw her last in  clinic. No further reoccurrence.   F/u in afib clinic 02/22/18, as she had a ER visit for afib and went to the ER for a cardioversion. She had multaq and eliquis on hand and took these but did not help to restore SR.  The cardioversion was successful and she is in SR today. She still would prefer to stay off daily meds . She has talked to Dr. Johney Frame about an ablation in the past but is still not quite ready for this either unless afib burden increases. She is seeing a weight loss specialist. She has not used her cpap for sleep apnea in some time, cannot tolerate mask. She thinks the trigger was lack of sleep and working long hours.   F/u in AF clinic 07/11/18. Patient reports that last night she began having feelings of SOB, weakness, and heart racing. In office today she is in afib with RVR and is very SOB, especially when walking. She has not passed out but has also been very lightheaded. She is not on Eliquis or Multaq at this time.  She was sent to the ER for urgent cardioversion.  F/u afib clinic, 07/21/18. She is s/p successful  CV and is staying in rhythm. She remains on Multaq and Eliquis but does not want to  stay on daily long term. She cannot tolerate the cpap and states that she sleeps poorly. She has stopped going to the weight loss MD as it was inconvenient for her in [place]. She has gained weight with Covid and working from home.   F/u in afib clinic, 03/30/19. She  had return of rapid afib last night with difficulty breathing and lightheadedness and asked to be seen today. She  in in afib at 138 bpm. BP soft around 100 systolic.  She is not on anticoagulation  for a CHA2DS2VASc score of 1. Last time I saw her in June she required an urgent cardioversion. Was on Multaq for a while but stopped in August 2020. She takes anticoagulation for the 4 weeks after cardioversion but then stops drug per protocol.  She did have an episode last week for a few hours of elevated HR but self converted. Discussed with  Dr. Johney Frame and he felt she should proceed to the ER for an urgent cardioversion as she is not on anticoagulation and is in the 48 hours of onset afib. Has not used cpap for OSA in past.  F/u in afib clinic, 04/13/19.  after successful cardioversion in the ER. She continues on eliquis 5 mg bid for post cardioversion protocol. Discussion today if she prefers to go back on Multaq bid to help prevent afib as she has had 2 cardioversions 6 months apart, or if she wants to discuss ablation with Dr. Johney Frame. She prefers to discuss with Dr. Johney Frame as she does not like to take meds. Will need updated echo.   F/u in afib clinic, 01/07/20. She had an appointment with Dr. Johney Frame 3 months ago but no change in approach to afib management was made. She is in SR today. No  further afib episodes to report. She is in SR today. She continues on atenolol,  CHA2DS2VASc score of 1, on daily asa and off eliquis after 4 weeks following last  cardioversion. She previously took Advertising copywriter, it worked well but she did not want to continue daily meds. She continues use of CPAP. She would prefer not to pursue ablation at this time.   F/u in afib clinic, 04/23/19. She is now one month s/p afib ablation and is very happy with the results. She has not noted any afib. No swallowing or groin issues. She is back to her usual activities. Remains on eliquis 5 mg bid for a CHA2DS2VASc score of 1.   F/u in afib clinic, 04/01/21, as she reported having 2-3 breakthrough afib episodes. The longest was 3 days. She was under some additional stress at that time. She is in SR today. Sheis not oon anticoagulation by guidelines with a CHA2DS2VASc  score of 1.   F/u in afib clinic, 12/22/21, after having to report to ED for a heart rate in afib around 180 bpm and palpable BP at 90 systolic and being symptomatic with presyncope. She was started on anticoagulation and converted with IV cardizem. She has not had any further afib. She is pending an appointment with  Dr. Lalla Brothers 12/20 to discuss second ablation. With a CHA2DS2VASc  score of 1, she wishes to stop anticoagulation since it has been one month since she chemically returned to SR, knowing if she has an ablation she will need to start it back 3 weeks prior.   On follow up 05/21/22, she is currently in SR. She is now s/p Afib ablation and ablation of an atrial tachycardia in the right atrium on 04/23/22 by  Dr. Lalla Brothers. She was on flecainide as a bridge prior to ablation and this was stopped after ablation. She went to the ED on 4/2 for acute left groin pain with no evidence of pseudoaneurysm or cellulitis and was begun on doxy and Keflex for empiric coverage. Eliquis held for 2 days due to new hematoma. Called 4/4 with pain under left breast and this was monitored. Seen by EP on 4/11 with improved groin pain and plan for no further antibiotic therapy.  Since OV on 4/11, she has been doing well. No episodes of Afib. Left groin has healed and she has no pain or fevers. Pain under left breast resolved. She is currently taking Lopressor 12.5 mg BID. No missed doses of Eliquis.   On follow up 06/28/22, she appears to be in atrial flutter with RVR. She took 1 dose of metoprolol about 2 hours prior to office visit. She can feel palpitations and short of breath with exertion. No missed doses of Eliquis.  On follow up 11/10/22, she is currently in NSR. She is now s/p 2 ED visits on 6/3 and 9/2 which required cardioversion. She was seen by Dr. Lalla Brothers on 6/27 after our last visit (which prompted first ED cardioversion) and he recommended dofetilide should recurrence occur. No missed doses of Eliquis. She is very hesitant to proceed with dofetilide because she does not want to take additional medication. She has very infrequent alcohol intake, only social, and has not used her CPAP in a while.   Today, she denies symptoms of palpitations, chest pain,+ shortness of breath/lightedness, no orthopnea, PND, lower extremity edema,  dizziness, presyncope, syncope, or neurologic sequela. The patient is tolerating medications without difficulties and is otherwise without complaint today.   Past Medical History:  Diagnosis Date   Anxiety    B12 deficiency    Difficult intubation    "SMALL TRACHEA"   GERD (gastroesophageal reflux disease)    History of kidney stones    H/O   OSA (obstructive sleep apnea)    USES CPAP ( rarely)   Paroxysmal atrial fibrillation (HCC)    Past Surgical History:  Procedure Laterality Date   ATRIAL FIBRILLATION ABLATION N/A 03/25/2020   Procedure: ATRIAL FIBRILLATION ABLATION;  Surgeon: Hillis Range, MD;  Location: [facility] INVASIVE CV LAB;  Service: Cardiovascular;  Laterality: N/A;   ATRIAL FIBRILLATION ABLATION N/A 04/23/2022   Procedure: ATRIAL FIBRILLATION ABLATION;  Surgeon: Lanier Prude, MD;  Location: [facility] INVASIVE CV LAB;  Service: Cardiovascular;  Laterality: N/A;   AUGMENTATION MAMMAPLASTY  2003   BREAST BIOPSY Right 2005   benign   COLONOSCOPY     DILATATION & CURETTAGE/HYSTEROSCOPY WITH MYOSURE N/A 01/02/2018   Procedure: DILATATION & CURETTAGE/HYSTEROSCOPY WITH MYOSURE, polypectomy;  Surgeon: Christeen Douglas, MD;  Location: [facility] ORS;  Service: Gynecology;  Laterality: N/A;   ESOPHAGOGASTRODUODENOSCOPY     KNEE ARTHROSCOPY Left 12/02/2017   Procedure: ARTHROSCOPY KNEE WITH PARTIAL MEDIAL MENISECTOMY, REMOVAL OF LOOSE BODY;  Surgeon: Signa Kell, MD;  Location: [facility] ORS;  Service: Orthopedics;  Laterality: Left;   REDUCTION MAMMAPLASTY  2007   TUMMY TUCK  2006    Current Outpatient Medications  Medication Sig Dispense Refill   acetaminophen (TYLENOL) 500 MG tablet Take 500 mg by mouth as needed for mild pain, moderate pain or headache.     ALPRAZolam (XANAX) 0.5 MG tablet Take 0.5-1 tablets (0.25-0.5 mg total) by mouth at bedtime as needed for anxiety. 30 tablet 0   Melatonin 10 MG CHEW Chew 20 mg by  mouth at bedtime.     metoprolol succinate (TOPROL XL) 25 MG 24 hr tablet  Take 1 tablet (25 mg total) by mouth at bedtime. 90 tablet 3   pantoprazole (PROTONIX) 40 MG tablet Take 40 mg by mouth as needed.     No current facility-administered medications for this encounter.    Allergies  Allergen Reactions   Codeine Nausea And Vomiting   Ivp Dye [Iodinated Contrast Media] Itching    ROS- All systems are reviewed and negative except as per the HPI above  Physical Exam: Vitals:   11/10/22 1406  BP: (!) 150/96  Pulse: 73  Weight: 96.4 kg  Height: 5\' 5"  (1.651 m)     Wt Readings from Last 3 Encounters:  11/10/22 96.4 kg  09/27/22 90.7 kg  07/22/22 95.7 kg    Labs: Lab Results  Component Value Date   NA 136 09/27/2022   K 4.3 09/27/2022   CL 103 09/27/2022   CO2 21 (L) 09/27/2022   GLUCOSE 110 (H) 09/27/2022   BUN 12 09/27/2022   CREATININE 0.83 09/27/2022   CALCIUM 8.8 (L) 09/27/2022   MG 2.0 01/20/2022   Lab Results  Component Value Date   INR 1.13 08/17/2017   Lab Results  Component Value Date   CHOL 172 11/19/2021   HDL 38 (L) 11/19/2021   LDLCALC 115 (H) 11/19/2021   TRIG 94 11/19/2021    GEN- The patient is well appearing, alert and oriented x 3 today.   Neck - no JVD or carotid bruit noted Lungs- Clear to ausculation bilaterally, normal work of breathing Heart- Regular rate and rhythm, no murmurs, rubs or gallops, PMI not laterally displaced Extremities- no clubbing, cyanosis, or edema Skin - no rash or ecchymosis noted   EKG -  Vent. rate 73 BPM PR interval 130 ms QRS duration 82 ms QT/QTcB 404/445 ms P-R-T axes 61 -11 54 Normal sinus rhythm Minimal voltage criteria for LVH, may be normal variant ( R in aVL ) Cannot rule out Anterior infarct , age undetermined Abnormal ECG When compared with ECG of 27-Sep-2022 13:23, PREVIOUS ECG IS PRESENT  Echo 01/21/22: 1. Left ventricular ejection fraction, by estimation, is 55 to 60%. The  left ventricle has normal function. The left ventricle has no regional  wall  motion abnormalities. Left ventricular diastolic parameters were  normal.   2. Right ventricular systolic function is normal. The right ventricular  size is normal.   3. The mitral valve is normal in structure. Trivial mitral valve  regurgitation. No evidence of mitral stenosis.   4. The aortic valve is normal in structure. Aortic valve regurgitation is  not visualized. No aortic stenosis is present.   5. The inferior vena cava is normal in size with greater than 50%  respiratory variability, suggesting right atrial pressure of 3 mmHg.   Assessment and Plan: 1.Symptomatic paroxysmal afib with RVR S/p ablation on 04/23/22. S/p successful DCCV in the ED on 06/28/22. S/p successful DCCV in the ED on 09/27/22.  She is currently in NSR. Previous discussion with Dr. Lalla Brothers with patient on 6/27 recommended dofetilide if recurrence of Afib occurred. After discussion with patient, she wishes to continue with conservative observation. She is going to try and lose 10% weight and will contact her PCP to repeat sleep study. It has been long enough she has not used CPAP that she does not know what settings the machine should be on. She verbally stated to me if she has another episode of  Afib that required ED evaluation, then she will call back to start the process for Tikosyn admission. She is over 4 weeks from date of last cardioversion so will stop Eliquis at this time due to Chads score of 1.   Repeat 110/72  Qtc in NSR - 445 ms. CrCl 110 mL/min.   F/u 6 months Afib clinic.  Lake Bells, PA-C Afib Clinic [facility] [address] [place], [place] [address] [phone]

## 2022-12-18 ENCOUNTER — Telehealth: Payer: Self-pay | Admitting: Cardiology

## 2022-12-18 ENCOUNTER — Other Ambulatory Visit: Payer: Self-pay

## 2022-12-18 ENCOUNTER — Encounter (HOSPITAL_COMMUNITY): Payer: Self-pay

## 2022-12-18 ENCOUNTER — Observation Stay (HOSPITAL_COMMUNITY)
Admission: EM | Admit: 2022-12-18 | Discharge: 2022-12-19 | Disposition: A | Discharge status: Home / Self Care | Payer: 59 | Attending: Cardiovascular Disease | Admitting: Cardiovascular Disease

## 2022-12-18 ENCOUNTER — Emergency Department (HOSPITAL_COMMUNITY): Payer: 59

## 2022-12-18 DIAGNOSIS — R000 Tachycardia, unspecified: Secondary | ICD-10-CM | POA: Diagnosis not present

## 2022-12-18 DIAGNOSIS — F1090 Alcohol use, unspecified, uncomplicated: Secondary | ICD-10-CM | POA: Diagnosis not present

## 2022-12-18 DIAGNOSIS — R791 Abnormal coagulation profile: Secondary | ICD-10-CM | POA: Diagnosis not present

## 2022-12-18 DIAGNOSIS — I4891 Unspecified atrial fibrillation: Principal | ICD-10-CM | POA: Insufficient documentation

## 2022-12-18 DIAGNOSIS — F419 Anxiety disorder, unspecified: Secondary | ICD-10-CM | POA: Insufficient documentation

## 2022-12-18 DIAGNOSIS — G4733 Obstructive sleep apnea (adult) (pediatric): Secondary | ICD-10-CM | POA: Diagnosis not present

## 2022-12-18 DIAGNOSIS — I4819 Other persistent atrial fibrillation: Secondary | ICD-10-CM

## 2022-12-18 DIAGNOSIS — K219 Gastro-esophageal reflux disease without esophagitis: Secondary | ICD-10-CM | POA: Insufficient documentation

## 2022-12-18 DIAGNOSIS — Z87891 Personal history of nicotine dependence: Secondary | ICD-10-CM | POA: Insufficient documentation

## 2022-12-18 LAB — CBC
HCT: 43.3 % (ref 36.0–46.0)
Hemoglobin: 14 g/dL (ref 12.0–15.0)
MCH: 29.4 pg (ref 26.0–34.0)
MCHC: 32.3 g/dL (ref 30.0–36.0)
MCV: 91 fL (ref 80.0–100.0)
Platelets: 348 10*3/uL (ref 150–400)
RBC: 4.76 MIL/uL (ref 3.87–5.11)
RDW: 12.4 % (ref 11.5–15.5)
WBC: 7.9 10*3/uL (ref 4.0–10.5)
nRBC: 0 % (ref 0.0–0.2)

## 2022-12-18 LAB — BASIC METABOLIC PANEL
Anion gap: 8 (ref 5–15)
BUN: 13 mg/dL (ref 6–20)
CO2: 26 mmol/L (ref 22–32)
Calcium: 8.9 mg/dL (ref 8.9–10.3)
Chloride: 106 mmol/L (ref 98–111)
Creatinine, Ser: 0.7 mg/dL (ref 0.44–1.00)
GFR, Estimated: >60 mL/min (ref >60)
Glucose, Bld: 110 mg/dL — ABNORMAL HIGH (ref 70–99)
Potassium: 4.3 mmol/L (ref 3.5–5.1)
Sodium: 140 mmol/L (ref 135–145)

## 2022-12-18 LAB — TROPONIN I (HIGH SENSITIVITY): Troponin I (High Sensitivity): 6 ng/L (ref <18)

## 2022-12-18 LAB — MAGNESIUM: Magnesium: 2 mg/dL (ref 1.7–2.4)

## 2022-12-18 MED ORDER — ACETAMINOPHEN 325 MG PO TABS: 650.0000 mg | ORAL_TABLET | ORAL | Status: DC | PRN

## 2022-12-18 MED ORDER — ALPRAZOLAM 0.5 MG PO TABS
0.5000 mg | ORAL_TABLET | Freq: Every evening | ORAL | Status: DC | PRN
  Administered 2022-12-18: 0.5 mg via ORAL
  Filled 2022-12-18: qty 1

## 2022-12-18 MED ORDER — DILTIAZEM LOAD VIA INFUSION
20.0000 mg | Freq: Once | INTRAVENOUS | Status: AC
  Administered 2022-12-18: 20 mg via INTRAVENOUS
  Filled 2022-12-18: qty 20

## 2022-12-18 MED ORDER — ONDANSETRON HCL 4 MG/2ML IJ SOLN: 4.0000 mg | Freq: Four times a day (QID) | INTRAMUSCULAR | Status: DC | PRN

## 2022-12-18 MED ORDER — APIXABAN 5 MG PO TABS
5.0000 mg | ORAL_TABLET | Freq: Two times a day (BID) | ORAL | Status: DC
  Administered 2022-12-18 – 2022-12-19 (×2): 5 mg via ORAL
  Filled 2022-12-18 (×2): qty 1

## 2022-12-18 MED ORDER — DILTIAZEM HCL-DEXTROSE 125-5 MG/125ML-% IV SOLN (PREMIX)
5.0000 mg/h | INTRAVENOUS | Status: DC
  Administered 2022-12-18: 5 mg/h via INTRAVENOUS
  Administered 2022-12-18: 15 mg/h via INTRAVENOUS
  Filled 2022-12-18 (×2): qty 125

## 2022-12-18 NOTE — ED Provider Notes (Signed)
[place] EMERGENCY DEPARTMENT AT [facility] Provider Note   CSN: [id number] Arrival date & time: 12/18/22  4098     History  Chief Complaint  Patient presents with   Shortness of Breath   Chest Pain   Arm Pain   Neck Pain   Atrial Fibrillation    Tracey Lin is a 62 y.o. female.    Patient has a history of anxiety acid reflux, paroxysmal atrial fibrillation obstructive sleep apnea.  States she has had atrial fibrillation for a long period of time.  Patient's had ablations in the past most recently this year.  Patient unfortunately still has had episodes of atrial fibrillation.  Patient states she has not been taking her anticoagulant.  This morning when she woke up she noticed her heart racing.  She was feeling lightheaded she had some neck discomfort.  She was tingling in her upper extremities.  Patient felt like she was back in atrial fibrillation.  She called cardiologist and took a dose of her anticoagulant.  Patient also took a dose of metoprolol.  She came to the ED for continued treatment.  Home Medications Prior to Admission medications   Medication Sig Start Date End Date Taking? Authorizing Provider  acetaminophen (TYLENOL) 500 MG tablet Take 500 mg by mouth as needed for mild pain, moderate pain or headache.    [provider]  ALPRAZolam Prudy Feeler) 0.5 MG tablet Take 0.5-1 tablets (0.25-0.5 mg total) by mouth at bedtime as needed for anxiety. 08/15/18   Dohmeier, Porfirio Mylar, MD  Melatonin 10 MG CHEW Chew 20 mg by mouth at bedtime.    [provider]  metoprolol succinate (TOPROL XL) 25 MG 24 hr tablet Take 1 tablet (25 mg total) by mouth at bedtime. 08/24/22   Lanier Prude, MD  pantoprazole (PROTONIX) 40 MG tablet Take 40 mg by mouth as needed.    [provider]      Allergies    Codeine and Ivp dye [iodinated contrast media]    Review of Systems   Review of Systems  Respiratory:  Positive for shortness of breath.    Cardiovascular:  Positive for chest pain.  Musculoskeletal:  Positive for neck pain.    Physical Exam Updated Vital Signs BP 104/78 (BP Location: Right Arm)   Pulse 74   Temp 98 F (36.7 C)   Resp 16   Ht 1.651 m (5\' 5" )   Wt 93 kg   LMP 08/09/2011 (Approximate) Comment: PT STILL SPOTS AS OF 08-08-17  SpO2 99%   BMI 34.11 kg/m  Physical Exam Vitals and nursing note reviewed.  Constitutional:      Appearance: She is well-developed. She is not diaphoretic.  HENT:     Head: Normocephalic and atraumatic.     Right Ear: External ear normal.     Left Ear: External ear normal.  Eyes:     General: No scleral icterus.       Right eye: No discharge.        Left eye: No discharge.     Conjunctiva/sclera: Conjunctivae normal.  Neck:     Trachea: No tracheal deviation.  Cardiovascular:     Rate and Rhythm: Tachycardia present. Rhythm irregular.  Pulmonary:     Effort: Pulmonary effort is normal. No tachypnea or respiratory distress.     Breath sounds: Normal breath sounds. No stridor. No wheezing or rales.  Abdominal:     General: Bowel sounds are normal. There is no distension.  Palpations: Abdomen is soft.     Tenderness: There is no abdominal tenderness. There is no guarding or rebound.  Musculoskeletal:        General: No tenderness or deformity.     Cervical back: Neck supple.  Skin:    General: Skin is warm and dry.     Findings: No rash.  Neurological:     General: No focal deficit present.     Mental Status: She is alert.     Cranial Nerves: No cranial nerve deficit, dysarthria or facial asymmetry.     Sensory: No sensory deficit.     Motor: No abnormal muscle tone or seizure activity.     Coordination: Coordination normal.  Psychiatric:        Mood and Affect: Mood normal.     ED Results / Procedures / Treatments   Labs (all labs ordered are listed, but only abnormal results are displayed) Labs Reviewed  BASIC METABOLIC PANEL - Abnormal; Notable for the  following components:      Result Value   Glucose, Bld 110 (*)    All other components within normal limits  CBC  MAGNESIUM  TROPONIN I (HIGH SENSITIVITY)    EKG EKG Interpretation Date/Time:  Saturday December 18 2022 09:53:25 EST Ventricular Rate:  163 PR Interval:    QRS Duration:  82 QT Interval:  250 QTC Calculation: 411 R Axis:   9  Text Interpretation: Atrial fibrillation with rapid ventricular response Minimal voltage criteria for LVH, may be normal variant ( R in aVL ) Nonspecific ST and T wave abnormality Abnormal ECG When compared with ECG of 10-Nov-2022 14:14, a fib is new since last tracing Confirmed by Linwood Dibbles [phone]) on 12/18/2022 10:10:02 AM  Radiology DG Chest 1 View  Result Date: 12/18/2022 CLINICAL DATA:  Tachycardia. EXAM: CHEST  1 VIEW COMPARISON:  09/27/2022 FINDINGS: Normal sized heart. Clear lungs with normal vascularity. Unremarkable bones. IMPRESSION: No active disease. Electronically Signed   By: Beckie Salts M.D.   On: 12/18/2022 10:40    Procedures .Critical Care  Performed by: Linwood Dibbles, MD Authorized by: Linwood Dibbles, MD   Critical care provider statement:    Critical care time (minutes):  30   Critical care was time spent personally by me on the following activities:  Development of treatment plan with patient or surrogate, discussions with consultants, evaluation of patient's response to treatment, examination of patient, ordering and review of laboratory studies, ordering and review of radiographic studies, ordering and performing treatments and interventions, pulse oximetry, re-evaluation of patient's condition and review of old charts     Medications Ordered in ED Medications  diltiazem (CARDIZEM) 1 mg/mL load via infusion 20 mg (20 mg Intravenous Bolus from Bag 12/18/22 1221)    And  diltiazem (CARDIZEM) 125 mg in dextrose 5% 125 mL (1 mg/mL) infusion (5 mg/hr Intravenous New Bag/Given 12/18/22 1221)    ED Course/ Medical Decision  Making/ A&P Clinical Course as of 12/18/22 1342  Sat Dec 18, 2022  1158 CBC metabolic panel normal.  Troponin normal [JK]  1159 X-ray without acute findings [JK]  1217 Rechecked patient.  Had not been started on Cardizem yet.  Nursing staff administering it now [JK]  1328 Heart rate down into the 80s now after Cardizem infusion.  Patient still is in A-fib [JK]  1341 Case discussed with Dr Jenene Slicker [JK]    Clinical Course User Index [JK] Linwood Dibbles, MD         CHA2DS2-VASc Score:  1                        Medical Decision Making Amount and/or Complexity of Data Reviewed Labs: ordered. Radiology: ordered.  Risk Prescription drug management. Decision regarding hospitalization.   Patient presented to ED for evaluation of recurrent atrial fibrillation.  Patient has had recurrent episodes.  She has had prior ablations.  Patient unfortunately presented back in acute atrial fibrillation with rapid ventricular rate.  Patient has not been on anticoagulation.  She is CHA2DS2-VASc 1.  She did take a dose of Eliquis before coming to the ED today.  Patient was started on Cardizem infusion with improvement of her heart rate.  No evidence of ischemia or acute electrolyte abnormalities.  Patient continues to be in atrial fibrillation despite the Cardizem infusion.  I will consult with cardiology to discuss admission for further treatment        Final Clinical Impression(s) / ED Diagnoses Final diagnoses:  Atrial fibrillation with rapid ventricular response [facility])    Rx / DC Orders ED Discharge Orders     None         Linwood Dibbles, MD 12/18/22 1342

## 2022-12-18 NOTE — ED Triage Notes (Addendum)
Pt. Stated, Im in A- Fib, it started this morning going to Bathroom and I was really SOB, arm pain, and neck pain. Pt off Elaquis for 2 months and took one this morning.

## 2022-12-18 NOTE — H&P (Signed)
Cardiology Admission History and Physical   Patient ID: Tracey Lin MRN: [id number]; DOB: [birth year]   Admission date: 12/18/2022  PCP:  Patrice Paradise, MD   [facility] Providers Cardiologist:  Rudi Coco, NP (Inactive)  Electrophysiologist:  Lanier Prude, MD  {   Chief Complaint:  Atrial fibrillation with RVR  Patient Profile:   Tracey Lin is a 62 y.o. female with past medical history of persistent atrial fibrillation, OSA s/p CPap, GERD who is being seen 12/18/2022 for the evaluation of atrial fibrillation with RVR.  History of Present Illness:   Tracey Lin is a 62 year old female with past medical history noted above.  She has been previously followed by Dr. Johney Frame as well as the A-fib clinic.  Transitioned to Dr. Lalla Brothers.  She has a history of multiple cardioversions and 2 atrial fibrillation ablations.  Most recent ablation was done 03/2022 with Dr. Lalla Brothers.  She was on a flecainide bridge prior to ablation and this was stopped afterward.  After her procedure she presented to the ED 3 days later with complaints of left groin pain with no evidence of pseudoaneurysm or cellulitis but was treated with doxycycline and Keflex.  Seen back in the clinic 6/3 and found to be back in atrial flutter with RVR.  She was seen in the ED 6/3 and 9/2 with cardioversions done.  Her last A-fib clinic visit it was recommended that she consider dofetilide but she was hesitant to proceed with any additional medication.  Given that she was over 4 weeks out from her last cardioversion she was cleared to stop her Eliquis given a CHA2DS2-VASc score of 1.  She called into the on-call line 11/23 with complaints of recurrent atrial fibrillation with RVR.  States the day prior she "felt off", and today noticed increasing shortness of breath, tingling in her arms and some chest pressure.  While on the phone she noted her heart rate to be in the 150s.  She resumed her Eliquis and  attempted to take metoprolol 25 mg with no improvement in her heart rate, therefore presented to the ED for further evaluation.  Labs in the ED showed sodium 140, potassium 4.3, creatinine 0.7, high-sensitivity troponin 6, WBC 7.9, hemoglobin 14.  Chest x-ray negative.  EKG atrial fibrillation with RVR, 163 bpm.  She was started on IV Cardizem in the ED with rates improved.  Cardiology now asked to evaluate.  Past Medical History:  Diagnosis Date   Anxiety    B12 deficiency    Difficult intubation    "SMALL TRACHEA"   GERD (gastroesophageal reflux disease)    History of kidney stones    H/O   OSA (obstructive sleep apnea)    USES CPAP ( rarely)   Paroxysmal atrial fibrillation (HCC)     Past Surgical History:  Procedure Laterality Date   ATRIAL FIBRILLATION ABLATION N/A 03/25/2020   Procedure: ATRIAL FIBRILLATION ABLATION;  Surgeon: Hillis Range, MD;  Location: [facility];  Service: Cardiovascular;  Laterality: N/A;   ATRIAL FIBRILLATION ABLATION N/A 04/23/2022   Procedure: ATRIAL FIBRILLATION ABLATION;  Surgeon: Lanier Prude, MD;  Location: [facility];  Service: Cardiovascular;  Laterality: N/A;   AUGMENTATION MAMMAPLASTY  2003   BREAST BIOPSY Right 2005   benign   COLONOSCOPY     DILATATION & CURETTAGE/HYSTEROSCOPY WITH MYOSURE N/A 01/02/2018   Procedure: DILATATION & CURETTAGE/HYSTEROSCOPY WITH MYOSURE, polypectomy;  Surgeon: Christeen Douglas, MD;  Location: [facility] ORS;  Service: Gynecology;  Laterality: N/A;   ESOPHAGOGASTRODUODENOSCOPY     KNEE ARTHROSCOPY Left 12/02/2017   Procedure: ARTHROSCOPY KNEE WITH PARTIAL MEDIAL MENISECTOMY, REMOVAL OF LOOSE BODY;  Surgeon: Signa Kell, MD;  Location: [facility] ORS;  Service: Orthopedics;  Laterality: Left;   REDUCTION MAMMAPLASTY  2007   TUMMY TUCK  2006     Medications Prior to Admission: Prior to Admission medications   Medication Sig Start Date End Date Taking? Authorizing Provider  acetaminophen (TYLENOL) 500 MG  tablet Take 500 mg by mouth as needed for mild pain, moderate pain or headache.    [provider]  ALPRAZolam Prudy Feeler) 0.5 MG tablet Take 0.5-1 tablets (0.25-0.5 mg total) by mouth at bedtime as needed for anxiety. 08/15/18   Dohmeier, Porfirio Mylar, MD  Melatonin 10 MG CHEW Chew 20 mg by mouth at bedtime.    [provider]  metoprolol succinate (TOPROL XL) 25 MG 24 hr tablet Take 1 tablet (25 mg total) by mouth at bedtime. 08/24/22   Lanier Prude, MD  pantoprazole (PROTONIX) 40 MG tablet Take 40 mg by mouth as needed.    [provider]     Allergies:    Allergies  Allergen Reactions   Codeine Nausea And Vomiting   Ivp Dye [Iodinated Contrast Media] Itching    Social History:   Social History   Socioeconomic History   Marital status: Divorced    Spouse name: Not on file   Number of children: Not on file   Years of education: Not on file   Highest education level: Not on file  Occupational History   Occupation: realtor  Tobacco Use   Smoking status: Never   Smokeless tobacco: Never   Tobacco comments:    Never smoke 12/22/21  Vaping Use   Vaping status: Never Used  Substance and Sexual Activity   Alcohol use: Yes    Alcohol/week: 1.0 standard drink of alcohol    Types: 1 Glasses of wine per week   Drug use: Never   Sexual activity: Not on file  Other Topics Concern   Not on file  Social History Narrative   Lives in [place]    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   Social Determinants of Health   Financial Resource Strain: Not on file  Food Insecurity: No Food Insecurity (11/18/2021)   Hunger Vital Sign    Worried About Running Out of Food in the Last Year: Never true    Ran Out of Food in the Last Year: Never true  Transportation Needs: No Transportation Needs (11/18/2021)   PRAPARE - Administrator, Civil Service (Medical): No    Lack of Transportation (Non-Medical): No  Physical Activity: Not on  file  Stress: Not on file  Social Connections: Not on file  Intimate Partner Violence: Not At Risk (11/18/2021)   Humiliation, Afraid, Rape, and Kick questionnaire    Fear of Current or Ex-Partner: No    Emotionally Abused: No    Physically Abused: No    Sexually Abused: No    Family History:   The patient's family history includes CAD in her mother; Heart disease in her mother; Pancreatic cancer in her father.    ROS:  Please see the history of present illness.  All other ROS reviewed and negative.     Physical Exam/Data:   Vitals:   12/18/22 0952 12/18/22 0957 12/18/22 1415  BP: 104/78  119/65  Pulse: 74  77  Resp: 16  (!)  23  Temp: 98 F (36.7 C)    SpO2: 99%  99%  Weight:  93 kg   Height:  5\' 5"  (1.651 m)    No intake or output data in the 24 hours ending 12/18/22 1449    12/18/2022    9:57 AM 11/10/2022    2:06 PM 09/27/2022   11:59 AM  Last 3 Weights  Weight (lbs) 205 lb 212 lb 9.6 oz 200 lb  Weight (kg) 92.987 kg 96.435 kg 90.719 kg     Body mass index is 34.11 kg/m.  General:  Well nourished, well developed, in no acute distress HEENT: normal Neck: no JVD Vascular: No carotid bruits; Distal pulses 2+ bilaterally   Cardiac:  normal S1, S2; Irreg Irreg; no murmur  Lungs:  clear to auscultation bilaterally, no wheezing, rhonchi or rales  Abd: soft, nontender, no hepatomegaly  Ext: no edema Musculoskeletal:  No deformities, BUE and BLE strength normal and equal Skin: warm and dry  Neuro:  CNs 2-12 intact, no focal abnormalities noted Psych:  Normal affect   EKG:  The ECG that was done 11/23 was personally reviewed and demonstrates Atrial fibrillation with RVR, 163 bpm  Relevant CV Studies:  Echo: 12/2021  IMPRESSIONS     1. Left ventricular ejection fraction, by estimation, is 55 to 60%. The  left ventricle has normal function. The left ventricle has no regional  wall motion abnormalities. Left ventricular diastolic parameters were  normal.   2.  Right ventricular systolic function is normal. The right ventricular  size is normal.   3. The mitral valve is normal in structure. Trivial mitral valve  regurgitation. No evidence of mitral stenosis.   4. The aortic valve is normal in structure. Aortic valve regurgitation is  not visualized. No aortic stenosis is present.   5. The inferior vena cava is normal in size with greater than 50%  respiratory variability, suggesting right atrial pressure of 3 mmHg.   FINDINGS   Left Ventricle: Left ventricular ejection fraction, by estimation, is 55  to 60%. The left ventricle has normal function. The left ventricle has no  regional wall motion abnormalities. The left ventricular internal cavity  size was normal in size. There is   no left ventricular hypertrophy. Left ventricular diastolic parameters  were normal.   Right Ventricle: The right ventricular size is normal. No increase in  right ventricular wall thickness. Right ventricular systolic function is  normal.   Left Atrium: Left atrial size was normal in size.   Right Atrium: Right atrial size was normal in size.   Pericardium: There is no evidence of pericardial effusion.   Mitral Valve: The mitral valve is normal in structure. Trivial mitral  valve regurgitation. No evidence of mitral valve stenosis.   Tricuspid Valve: The tricuspid valve is normal in structure. Tricuspid  valve regurgitation is not demonstrated. No evidence of tricuspid  stenosis.   Aortic Valve: The aortic valve is normal in structure. Aortic valve  regurgitation is not visualized. No aortic stenosis is present.   Pulmonic Valve: The pulmonic valve was normal in structure. Pulmonic valve  regurgitation is trivial. No evidence of pulmonic stenosis.   Aorta: The aortic root is normal in size and structure.   Venous: The inferior vena cava is normal in size with greater than 50%  respiratory variability, suggesting right atrial pressure of 3 mmHg.    IAS/Shunts: No atrial level shunt detected by color flow Doppler.   Laboratory Data:  High Sensitivity  Troponin:   Recent Labs  Lab 12/18/22 1017  TROPONINIHS 6      Chemistry Recent Labs  Lab 12/18/22 1017  NA 140  K 4.3  CL 106  CO2 26  GLUCOSE 110*  BUN 13  CREATININE 0.70  CALCIUM 8.9  MG 2.0  GFRNONAA >60  ANIONGAP 8    No results for input(s): "PROT", "ALBUMIN", "AST", "ALT", "ALKPHOS", "BILITOT" in the last 168 hours. Lipids No results for input(s): "CHOL", "TRIG", "HDL", "LABVLDL", "LDLCALC", "CHOLHDL" in the last 168 hours. Hematology Recent Labs  Lab 12/18/22 1017  WBC 7.9  RBC 4.76  HGB 14.0  HCT 43.3  MCV 91.0  MCH 29.4  MCHC 32.3  RDW 12.4  PLT 348   Thyroid No results for input(s): "TSH", "FREET4" in the last 168 hours. BNPNo results for input(s): "BNP", "PROBNP" in the last 168 hours.  DDimer No results for input(s): "DDIMER" in the last 168 hours.   Radiology/Studies:  DG Chest 1 View  Result Date: 12/18/2022 CLINICAL DATA:  Tachycardia. EXAM: CHEST  1 VIEW COMPARISON:  09/27/2022 FINDINGS: Normal sized heart. Clear lungs with normal vascularity. Unremarkable bones. IMPRESSION: No active disease. Electronically Signed   By: Beckie Salts M.D.   On: 12/18/2022 10:40     Assessment and Plan:   DESERA DUMMER is a 62 y.o. female with past medical history of persistent atrial fibrillation, OSA s/p CPap, GERD who is being seen 12/18/2022 for the evaluation of atrial fibrillation with RVR.  Atrial fibrillation with RVR -- prior ablation x2, along with several DCCV and previously on multaq -- Symptoms began yesterday afternoon and worsened this morning.  She resumed her Eliquis as well as attempted to take metoprolol at home without improvement in her heart rate. -- Started on diltiazem drip 5mg /hr in the ED with significant improvement in heart rate -- Continue/resume Eliquis 5 mg twice daily -- Review with MD regarding further plans as  previously discussed Tikosyn  OSA -- reports she has not been able to tolerate Cpap in the past -- will need updated outpatient sleep study   Risk Assessment/Risk Scores:    CHA2DS2-VASc Score = 1   This indicates a 0.6% annual risk of stroke. The patient's score is based upon: CHF History: 0 HTN History: 0 Diabetes History: 0 Stroke History: 0 Vascular Disease History: 0 Age Score: 0 Gender Score: 1   Code Status: Full Code  Severity of Illness: The appropriate patient status for this patient is OBSERVATION. Observation status is judged to be reasonable and necessary in order to provide the required intensity of service to ensure the patient's safety. The patient's presenting symptoms, physical exam findings, and initial radiographic and laboratory data in the context of their medical condition is felt to place them at decreased risk for further clinical deterioration. Furthermore, it is anticipated that the patient will be medically stable for discharge from the hospital within 2 midnights of admission.    For questions or updates, please contact [facility] Please consult [email or web address] for contact info under     Signed, Laverda Page, NP  12/18/2022 2:49 PM

## 2022-12-18 NOTE — Telephone Encounter (Signed)
Patient called in regarding concern for being back in Atrial fibrillation with rapid HRs. She has a hx of Afib s/p DCCV and ablations. Most recently she was doing well and cleared to stop her Eliquis with ChadsVasc of 1. Reports feeling "off" yesterday, and this morning noted her usual symptoms of afib with pain in her arm, dizziness and pressure. HR noted in the 150s when checked. She has Eliquis, I advised ok to resume. She would like to take her metoprolol 25mg  and see if this helps. I advised this may not have enough affect on her HR. She has a friend who will take her to the ED. Given she has not bee no Eliquis, would not anticipate DCCV, rather treating with medications. She voiced understanding and thanked me for callback.

## 2022-12-19 DIAGNOSIS — F1090 Alcohol use, unspecified, uncomplicated: Secondary | ICD-10-CM | POA: Diagnosis not present

## 2022-12-19 DIAGNOSIS — K219 Gastro-esophageal reflux disease without esophagitis: Secondary | ICD-10-CM | POA: Diagnosis not present

## 2022-12-19 DIAGNOSIS — I4891 Unspecified atrial fibrillation: Secondary | ICD-10-CM | POA: Diagnosis not present

## 2022-12-19 DIAGNOSIS — R791 Abnormal coagulation profile: Secondary | ICD-10-CM | POA: Diagnosis not present

## 2022-12-19 DIAGNOSIS — I4819 Other persistent atrial fibrillation: Secondary | ICD-10-CM | POA: Diagnosis not present

## 2022-12-19 DIAGNOSIS — Z87891 Personal history of nicotine dependence: Secondary | ICD-10-CM | POA: Diagnosis not present

## 2022-12-19 DIAGNOSIS — F419 Anxiety disorder, unspecified: Secondary | ICD-10-CM | POA: Diagnosis not present

## 2022-12-19 LAB — CBC
HCT: 37.9 % (ref 36.0–46.0)
Hemoglobin: 12.1 g/dL (ref 12.0–15.0)
MCH: 29.1 pg (ref 26.0–34.0)
MCHC: 31.9 g/dL (ref 30.0–36.0)
MCV: 91.1 fL (ref 80.0–100.0)
Platelets: 287 10*3/uL (ref 150–400)
RBC: 4.16 MIL/uL (ref 3.87–5.11)
RDW: 12.7 % (ref 11.5–15.5)
WBC: 8.3 10*3/uL (ref 4.0–10.5)
nRBC: 0 % (ref 0.0–0.2)

## 2022-12-19 LAB — BASIC METABOLIC PANEL
Anion gap: 8 (ref 5–15)
BUN: 14 mg/dL (ref 6–20)
CO2: 25 mmol/L (ref 22–32)
Calcium: 8.4 mg/dL — ABNORMAL LOW (ref 8.9–10.3)
Chloride: 105 mmol/L (ref 98–111)
Creatinine, Ser: 0.97 mg/dL (ref 0.44–1.00)
GFR, Estimated: >60 mL/min (ref >60)
Glucose, Bld: 102 mg/dL — ABNORMAL HIGH (ref 70–99)
Potassium: 3.8 mmol/L (ref 3.5–5.1)
Sodium: 138 mmol/L (ref 135–145)

## 2022-12-19 MED ORDER — METOPROLOL SUCCINATE ER 25 MG PO TB24
25.0000 mg | ORAL_TABLET | Freq: Every day | ORAL | Status: DC
  Administered 2022-12-19: 25 mg via ORAL
  Filled 2022-12-19: qty 1

## 2022-12-19 MED ORDER — FLECAINIDE ACETATE 50 MG PO TABS
50.0000 mg | ORAL_TABLET | Freq: Two times a day (BID) | ORAL | Status: DC
  Administered 2022-12-19: 50 mg via ORAL
  Filled 2022-12-19: qty 1

## 2022-12-19 MED ORDER — METOPROLOL SUCCINATE ER 25 MG PO TB24: 25.0000 mg | ORAL_TABLET | Freq: Every evening | ORAL | 3 refills | Status: DC

## 2022-12-19 MED ORDER — FLECAINIDE ACETATE 50 MG PO TABS: 50.0000 mg | ORAL_TABLET | Freq: Two times a day (BID) | ORAL | 5 refills | Status: DC

## 2022-12-19 MED ORDER — APIXABAN 5 MG PO TABS: 5.0000 mg | ORAL_TABLET | Freq: Two times a day (BID) | ORAL | 5 refills | Status: DC

## 2022-12-19 NOTE — Discharge Summary (Signed)
Discharge Summary    Patient ID: Tracey Lin MRN: [id number]; DOB: [birth year]  Admit date: 12/18/2022 Discharge date: 12/19/2022  PCP:  Patrice Paradise, MD   [facility] Providers Cardiologist:  None  Electrophysiologist:  Lanier Prude, MD  {  Discharge Diagnoses    Principal Problem:   Atrial fibrillation [facility])  Diagnostic Studies/Procedures    None this admission.    History of Present Illness     Tracey Lin is a 62 y.o. female with past medical history of persistent atrial fibrillation (s/p ablation in 03/2020 and 03/2022), OSA (on CPAP) and GERD who presented to [facility] ED on 12/18/2022 for evaluation of palpitations.  She called the after-hours line reporting feeling "off" with associated pain in her arms, dizziness and chest pressure. Had checked her heart rate at home and it was elevated to the 150's. She had taken extra Lopressor with no improvement, therefore ED evaluation was recommended.  Upon arrival, she was in atrial fibrillation with RVR with heart rate in the 160's. Hs troponin values were negative and electrolytes WNL.   She was started in IV Cardizem and Eliquis and admitted for further management.   Hospital Course     Consultants: None   She was evaluated by Dr. Nelly Laurence at the time of admission with recommendations to start Tikosyn but she was reluctant to do this. Was continued on IV Cardizem overnight and she did convert back to normal sinus rhythm. She decided that she did not wish to proceed with Tikosyn initiation during admission and wished to have this arranged as an outpatient. She was willing to try Flecainide in the interim and she had failed it in the past but had another ablation after this. Was recommended to start Flecainide 50 mg twice daily while continuing Eliquis 5 mg twice daily and Toprol-XL 25 mg daily at discharge with follow-up in the Atrial Fibrillation Clinic to arrange for Tikosyn loading. She was  examined by Dr. Nelly Laurence and deemed stable for discharge. A staff message has been sent to the Atrial Fibrillation Clinic to arrange for follow-up. _____________  Discharge Vitals Blood pressure 99/70, pulse 72, temperature 97.6 F (36.4 C), temperature source Oral, resp. rate 18, height 5\' 5"  (1.651 m), weight 95.2 kg, last menstrual period 08/09/2011, SpO2 97%.  Filed Weights   12/18/22 0957 12/18/22 1708  Weight: 93 kg 95.2 kg    Labs & Radiologic Studies    CBC Recent Labs    12/18/22 1017 12/19/22 0340  WBC 7.9 8.3  HGB 14.0 12.1  HCT 43.3 37.9  MCV 91.0 91.1  PLT 348 287   Basic Metabolic Panel Recent Labs    40/98/11 1017 12/19/22 0340  NA 140 138  K 4.3 3.8  CL 106 105  CO2 26 25  GLUCOSE 110* 102*  BUN 13 14  CREATININE 0.70 0.97  CALCIUM 8.9 8.4*  MG 2.0  --    Liver Function Tests No results for input(s): "AST", "ALT", "ALKPHOS", "BILITOT", "PROT", "ALBUMIN" in the last 72 hours. No results for input(s): "LIPASE", "AMYLASE" in the last 72 hours. High Sensitivity Troponin:   Recent Labs  Lab 12/18/22 1017  TROPONINIHS 6    BNP Invalid input(s): "POCBNP" D-Dimer No results for input(s): "DDIMER" in the last 72 hours. Hemoglobin A1C No results for input(s): "HGBA1C" in the last 72 hours. Fasting Lipid Panel No results for input(s): "CHOL", "HDL", "LDLCALC", "TRIG", "CHOLHDL", "LDLDIRECT" in the last 72 hours. Thyroid Function Tests No  results for input(s): "TSH", "T4TOTAL", "T3FREE", "THYROIDAB" in the last 72 hours.  Invalid input(s): "FREET3" _____________  DG Chest 1 View  Result Date: 12/18/2022 CLINICAL DATA:  Tachycardia. EXAM: CHEST  1 VIEW COMPARISON:  09/27/2022 FINDINGS: Normal sized heart. Clear lungs with normal vascularity. Unremarkable bones. IMPRESSION: No active disease. Electronically Signed   By: Beckie Salts M.D.   On: 12/18/2022 10:40   Disposition   Pt is being discharged home today in good condition.  Follow-up Plans &  Appointments     Follow-up Information     [facility] Follow up.   Specialty: Cardiology Why: The office will contact you within 2-3 business days to arrange for follow-up. If you do not hear from them within this timeframe, please contact the office. Contact information: [address] [place] [address] [phone]               Discharge Instructions     Increase activity slowly   Complete by: As directed         Discharge Medications   Allergies as of 12/19/2022       Reactions   Codeine Nausea And Vomiting   Ivp Dye [iodinated Contrast Media] Itching        Medication List     TAKE these medications    acetaminophen 500 MG tablet Commonly known as: TYLENOL Take 500 mg by mouth daily as needed for moderate pain (pain score 4-6), headache or fever.   ALPRAZolam 0.5 MG tablet Commonly known as: XANAX Take 0.5-1 tablets (0.25-0.5 mg total) by mouth at bedtime as needed for anxiety.   apixaban 5 MG Tabs tablet Commonly known as: Eliquis Take 1 tablet (5 mg total) by mouth 2 (two) times daily.   flecainide 50 MG tablet Commonly known as: TAMBOCOR Take 1 tablet (50 mg total) by mouth 2 (two) times daily.   MAGNESIUM PO Take 1 tablet by mouth at bedtime.   metoprolol succinate 25 MG 24 hr tablet Commonly known as: Toprol XL Take 1 tablet (25 mg total) by mouth at bedtime.           Outstanding Labs/Studies   None  Duration of Discharge Encounter   Greater than 30 minutes including physician time.  Signed, Ellsworth Lennox, PA-C 12/19/2022, 9:45 AM

## 2022-12-19 NOTE — Progress Notes (Addendum)
At beginning of shift IV diltiazem was at 15 mg/hr with controlled heart rate.  IV diltiazem has been titrated down due to the SBP decreasing, currently stopped due to SBP < 90.  Patient is asymptomatic and heart rate is in the 80s.  Notified Dr. Laurelyn Sickle  via Amion per orders to do so and will resume drip per orders as blood pressure allows.

## 2022-12-19 NOTE — Progress Notes (Signed)
Electrophysiology Progress Note  Patient Name: Tracey Lin Date of Encounter: 12/19/2022   Electrophysiologist: Dr. Lalla Brothers   Subjective   Feeling much better -- back in sinus rhythm today  Inpatient Medications    Scheduled Meds:  apixaban  5 mg Oral BID   Continuous Infusions:  diltiazem (CARDIZEM) infusion 5 mg/hr (12/19/22 0201)   PRN Meds: acetaminophen, ALPRAZolam, ondansetron (ZOFRAN) IV   Vital Signs    Vitals:   12/19/22 0100 12/19/22 0200 12/19/22 0300 12/19/22 0728  BP: (!) 87/51 100/62 99/70   Pulse: 85 (!) 55 72   Resp:   20 18  Temp:   97.6 F (36.4 C) 97.6 F (36.4 C)  TempSrc:   Oral Oral  SpO2: 94% 98% 97%   Weight:      Height:        Intake/Output Summary (Last 24 hours) at 12/19/2022 0820 Last data filed at 12/18/2022 2200 Gross per 24 hour  Intake 382.37 ml  Output --  Net 382.37 ml   Filed Weights   12/18/22 0957 12/18/22 1708  Weight: 93 kg 95.2 kg    Physical Exam    GEN- The patient is well appearing, alert and oriented x 3 today.   Lungs- Clear to ausculation bilaterally, normal work of breathing Heart- Regular rate and rhythm, no murmurs, rubs or gallops Extremities- no clubbing, cyanosis, or edema Neuro- strength and sensation are intact  Labs      Telemetry    Sinus rhythm (personally reviewed)  ECG shows sinus rhythm, Qtc (by computer) is 452  Radiology    DG Chest 1 View  Result Date: 12/18/2022 CLINICAL DATA:  Tachycardia. EXAM: CHEST  1 VIEW COMPARISON:  09/27/2022 FINDINGS: Normal sized heart. Clear lungs with normal vascularity. Unremarkable bones. IMPRESSION: No active disease. Electronically Signed   By: Beckie Salts M.D.   On: 12/18/2022 10:40     Patient Profile     Tracey Lin is a 62 y.o. female with a past medical history significant for paroxysmal atrial fibrillation, atrial tachycardia, untreated sleep apnea admitted for recurrent atrial fibrillation.    She converted to sinus  rhythm this a.m. on diltiazem drip  Assessment & Plan     Persistent atrial fibrillation S/p ablation x2, most recently March 2024 -- only a small amount of reconnection of the left superior pulmonary vein Has been on flecainide in the past - this was discontinued prior to most recent ablation Very symptomatic with A-fib Tikosyn has been recommended as next by her primary electrophysiologist, Dr. Lalla Brothers She is very resistant to the idea of taking an antiarrhythmic drug, thus she has had 3 recurrences and has not committed to Tikosyn She has decided she does not want to proceed with Tikosyn load this admission.  She wants to return to have it done on a future schedule date.  She concedes that she is very anxious about this whole process. She is willing to try flecainide again in the interim since she has taken it before. Though she has failed it in the past, she has since had another ablation for AF and AT and may have more success now Because her CHA2DS2-VASc score, and she resumed Eliquis immediately with recurrence of atrial fibrillation this AM, I think her risk of LAA thrombus is very low -- equivalent to or less than if she had been on eliquis for 3 months while in AF. Discharge today on flecainide 50 BID and Eliquis 5 BID. Continue metoprolol XL 25  Will arrange follow-up in AF clinic to arrange Tikosyn load   Atrial tachycardia Status post ablation of a focus adjacent to the tricuspid valve   Secondary hypercoagulable state She has been off anticoagulation due to her CHA2DS2-VASc score of 1 She resumed Eliquis within minutes to hours after recurrence of A-fib this morning   Obstructive sleep apnea She has not been able to tolerate CPAP We discussed the importance of sleep apnea management for maintenance of sinus rhythm   York Pellant MD 12/19/2022 8:20 AM    For questions or updates, please contact [facility] Please consult [email or web address] for contact info under  Cardiology/STEMI.  Signed, Maurice Small, MD  12/19/2022, 8:20 AM

## 2022-12-19 NOTE — Progress Notes (Addendum)
Pt ambulated to bathroom with heart rate increasing to 130s with ambulation.  SBP 102 when returning to bed, IV diltiazem restarted at 5 mg/ hour,  heart rate low 100s upon settling back in bed.  Pt denies any c/o at present.  Will continue to monitor closely.

## 2023-01-07 ENCOUNTER — Inpatient Hospital Stay (HOSPITAL_COMMUNITY): Admission: RE | Admit: 2023-01-07 | Payer: 59 | Source: Ambulatory Visit | Admitting: Internal Medicine

## 2023-01-10 DIAGNOSIS — Z13220 Encounter for screening for lipoid disorders: Secondary | ICD-10-CM | POA: Diagnosis not present

## 2023-01-10 DIAGNOSIS — I480 Paroxysmal atrial fibrillation: Secondary | ICD-10-CM | POA: Diagnosis not present

## 2023-01-10 DIAGNOSIS — Z6839 Body mass index (BMI) 39.0-39.9, adult: Secondary | ICD-10-CM | POA: Diagnosis not present

## 2023-01-10 DIAGNOSIS — Z1231 Encounter for screening mammogram for malignant neoplasm of breast: Secondary | ICD-10-CM | POA: Diagnosis not present

## 2023-01-10 DIAGNOSIS — E6609 Other obesity due to excess calories: Secondary | ICD-10-CM | POA: Diagnosis not present

## 2023-01-10 DIAGNOSIS — E559 Vitamin D deficiency, unspecified: Secondary | ICD-10-CM | POA: Diagnosis not present

## 2023-01-10 DIAGNOSIS — Z0000 Encounter for general adult medical examination without abnormal findings: Secondary | ICD-10-CM | POA: Diagnosis not present

## 2023-01-10 DIAGNOSIS — E66812 Obesity, class 2: Secondary | ICD-10-CM | POA: Diagnosis not present

## 2023-01-10 DIAGNOSIS — Z131 Encounter for screening for diabetes mellitus: Secondary | ICD-10-CM | POA: Diagnosis not present

## 2023-01-10 DIAGNOSIS — Z1211 Encounter for screening for malignant neoplasm of colon: Secondary | ICD-10-CM | POA: Diagnosis not present

## 2023-01-10 DIAGNOSIS — E538 Deficiency of other specified B group vitamins: Secondary | ICD-10-CM | POA: Diagnosis not present

## 2023-01-10 DIAGNOSIS — G4733 Obstructive sleep apnea (adult) (pediatric): Secondary | ICD-10-CM | POA: Diagnosis not present

## 2023-01-10 DIAGNOSIS — D649 Anemia, unspecified: Secondary | ICD-10-CM | POA: Diagnosis not present

## 2023-01-11 ENCOUNTER — Other Ambulatory Visit: Payer: Self-pay | Admitting: Physician Assistant

## 2023-01-11 DIAGNOSIS — Z1231 Encounter for screening mammogram for malignant neoplasm of breast: Secondary | ICD-10-CM

## 2023-01-17 ENCOUNTER — Ambulatory Visit (HOSPITAL_COMMUNITY)
Admission: RE | Admit: 2023-01-17 | Discharge: 2023-01-17 | Disposition: A | Discharge status: Home / Self Care | Payer: 59 | Source: Ambulatory Visit | Attending: Internal Medicine | Admitting: Internal Medicine

## 2023-01-17 VITALS — BP 124/70 | HR 64 | Ht 65.0 in | Wt 214.2 lb

## 2023-01-17 DIAGNOSIS — I4891 Unspecified atrial fibrillation: Secondary | ICD-10-CM | POA: Diagnosis not present

## 2023-01-17 DIAGNOSIS — Z79899 Other long term (current) drug therapy: Secondary | ICD-10-CM | POA: Diagnosis not present

## 2023-01-17 DIAGNOSIS — G4733 Obstructive sleep apnea (adult) (pediatric): Secondary | ICD-10-CM | POA: Diagnosis not present

## 2023-01-17 DIAGNOSIS — Z5181 Encounter for therapeutic drug level monitoring: Secondary | ICD-10-CM | POA: Diagnosis not present

## 2023-01-17 DIAGNOSIS — I480 Paroxysmal atrial fibrillation: Secondary | ICD-10-CM | POA: Diagnosis not present

## 2023-01-17 NOTE — Progress Notes (Signed)
Primary Care Physician: Patrice Paradise, MD Referring Physician:  Dr. Leda Gauze is a 62 y.o. female with a h/o paroxysmal afib, sleep apnea, treated with CPAP that is in the afib clinic for f/u hospitalization for afib while in [place], [place] to attend a [place] rally in 2019. She was walking to her car and  was aware of increased HR, dizziness and lightheadedness and was taken to [facility].  She had been out in the sun for about 30 mins and her fluid intake had been less than usual  for the day. Her HR was 170 bpm. She was thought to have mild dehydration.She was admitted, placed on Cardizem drip for a couple of days then started on Multaq, ranexa, atenolol, eliquis and did convert to SR prior to discharge. The MD told pt that she would need quick f/u as multaq was for short term as it could cause liver failure. She is planning to see Dr. Johney Frame soon. She would like to discuss coming off drugs and a possible ablation down the line. CHA2DS2VASc score is 1 for female. She would like to get clearance to have her knee surgery as she wants this first so she can get back to exercising to obtain wight loss. Echo was done at [facility] and with normal results( all records can be found in Care Everywhere).   She has had afib for many years but it had been quiet, treated in the [place] area until she moved to [place] 6 years ago. She saw Dr. Darrold Junker a couple of times but has not seen him since 2015.  She was suppose to have knee surgery last week but it was cancelled.  She states that she has gained  40 lbs over the last 6 years. She does use cpap, but still sleeps very poorly. Feels that she does get at least 4 hours a night use.  F/u in afib clinic 9/9. When she saw Dr. Johney Frame, he advised her to start weaning off the ranexa, multaq and DOAC which she has successfully done. She  feels well. No further afib. She did have a SBO which self released right after I saw her last in  clinic. No further reoccurrence.   F/u in afib clinic 02/22/18, as she had a ER visit for afib and went to the ER for a cardioversion. She had multaq and eliquis on hand and took these but did not help to restore SR.  The cardioversion was successful and she is in SR today. She still would prefer to stay off daily meds . She has talked to Dr. Johney Frame about an ablation in the past but is still not quite ready for this either unless afib burden increases. She is seeing a weight loss specialist. She has not used her cpap for sleep apnea in some time, cannot tolerate mask. She thinks the trigger was lack of sleep and working long hours.   F/u in AF clinic 07/11/18. Patient reports that last night she began having feelings of SOB, weakness, and heart racing. In office today she is in afib with RVR and is very SOB, especially when walking. She has not passed out but has also been very lightheaded. She is not on Eliquis or Multaq at this time.  She was sent to the ER for urgent cardioversion.  F/u afib clinic, 07/21/18. She is s/p successful  CV and is staying in rhythm. She remains on Multaq and Eliquis but does not want to  stay on daily long term. She cannot tolerate the cpap and states that she sleeps poorly. She has stopped going to the weight loss MD as it was inconvenient for her in [place]. She has gained weight with Covid and working from home.   F/u in afib clinic, 03/30/19. She  had return of rapid afib last night with difficulty breathing and lightheadedness and asked to be seen today. She  in in afib at 138 bpm. BP soft around 100 systolic.  She is not on anticoagulation  for a CHA2DS2VASc score of 1. Last time I saw her in June she required an urgent cardioversion. Was on Multaq for a while but stopped in August 2020. She takes anticoagulation for the 4 weeks after cardioversion but then stops drug per protocol.  She did have an episode last week for a few hours of elevated HR but self converted. Discussed with  Dr. Johney Frame and he felt she should proceed to the ER for an urgent cardioversion as she is not on anticoagulation and is in the 48 hours of onset afib. Has not used cpap for OSA in past.  F/u in afib clinic, 04/13/19.  after successful cardioversion in the ER. She continues on eliquis 5 mg bid for post cardioversion protocol. Discussion today if she prefers to go back on Multaq bid to help prevent afib as she has had 2 cardioversions 6 months apart, or if she wants to discuss ablation with Dr. Johney Frame. She prefers to discuss with Dr. Johney Frame as she does not like to take meds. Will need updated echo.   F/u in afib clinic, 01/07/20. She had an appointment with Dr. Johney Frame 3 months ago but no change in approach to afib management was made. She is in SR today. No  further afib episodes to report. She is in SR today. She continues on atenolol,  CHA2DS2VASc score of 1, on daily asa and off eliquis after 4 weeks following last  cardioversion. She previously took Advertising copywriter, it worked well but she did not want to continue daily meds. She continues use of CPAP. She would prefer not to pursue ablation at this time.   F/u in afib clinic, 04/23/19. She is now one month s/p afib ablation and is very happy with the results. She has not noted any afib. No swallowing or groin issues. She is back to her usual activities. Remains on eliquis 5 mg bid for a CHA2DS2VASc score of 1.   F/u in afib clinic, 04/01/21, as she reported having 2-3 breakthrough afib episodes. The longest was 3 days. She was under some additional stress at that time. She is in SR today. Sheis not oon anticoagulation by guidelines with a CHA2DS2VASc  score of 1.   F/u in afib clinic, 12/22/21, after having to report to ED for a heart rate in afib around 180 bpm and palpable BP at 90 systolic and being symptomatic with presyncope. She was started on anticoagulation and converted with IV cardizem. She has not had any further afib. She is pending an appointment with  Dr. Lalla Brothers 12/20 to discuss second ablation. With a CHA2DS2VASc  score of 1, she wishes to stop anticoagulation since it has been one month since she chemically returned to SR, knowing if she has an ablation she will need to start it back 3 weeks prior.   On follow up 05/21/22, she is currently in SR. She is now s/p Afib ablation and ablation of an atrial tachycardia in the right atrium on 04/23/22 by  Dr. Lalla Brothers. She was on flecainide as a bridge prior to ablation and this was stopped after ablation. She went to the ED on 4/2 for acute left groin pain with no evidence of pseudoaneurysm or cellulitis and was begun on doxy and Keflex for empiric coverage. Eliquis held for 2 days due to new hematoma. Called 4/4 with pain under left breast and this was monitored. Seen by EP on 4/11 with improved groin pain and plan for no further antibiotic therapy.  Since OV on 4/11, she has been doing well. No episodes of Afib. Left groin has healed and she has no pain or fevers. Pain under left breast resolved. She is currently taking Lopressor 12.5 mg BID. No missed doses of Eliquis.   On follow up 06/28/22, she appears to be in atrial flutter with RVR. She took 1 dose of metoprolol about 2 hours prior to office visit. She can feel palpitations and short of breath with exertion. No missed doses of Eliquis.  On follow up 11/10/22, she is currently in NSR. She is now s/p 2 ED visits on 6/3 and 9/2 which required cardioversion. She was seen by Dr. Lalla Brothers on 6/27 after our last visit (which prompted first ED cardioversion) and he recommended dofetilide should recurrence occur. No missed doses of Eliquis. She is very hesitant to proceed with dofetilide because she does not want to take additional medication. She has very infrequent alcohol intake, only social, and has not used her CPAP in a while.   On follow up 01/17/23, she is currently in NSR. Recent hospital admission 11/23-24/24 for Afib with RVR. She declined Tikosyn load  while in hospital and preferred to be restarted on flecainide; notes show she preferred to schedule Tikosyn load as outpatient. She converted to NSR on IV diltiazem. She was discharged on flecainide 50 mg BID and Toprol 25 mg daily. She restarted Eliquis 5 mg BID immediately after noting she went into Afib. She has had zero episodes of Afib since hospital discharge. No missed doses of Eliquis.   Today, she denies symptoms of palpitations, chest pain,+ shortness of breath/lightedness, no orthopnea, PND, lower extremity edema, dizziness, presyncope, syncope, or neurologic sequela. The patient is tolerating medications without difficulties and is otherwise without complaint today.   Past Medical History:  Diagnosis Date   Anxiety    B12 deficiency    Difficult intubation    "SMALL TRACHEA"   GERD (gastroesophageal reflux disease)    History of kidney stones    H/O   OSA (obstructive sleep apnea)    USES CPAP ( rarely)   Paroxysmal atrial fibrillation (HCC)    Past Surgical History:  Procedure Laterality Date   ATRIAL FIBRILLATION ABLATION N/A 03/25/2020   Procedure: ATRIAL FIBRILLATION ABLATION;  Surgeon: Hillis Range, MD;  Location: [facility];  Service: Cardiovascular;  Laterality: N/A;   ATRIAL FIBRILLATION ABLATION N/A 04/23/2022   Procedure: ATRIAL FIBRILLATION ABLATION;  Surgeon: Lanier Prude, MD;  Location: [facility];  Service: Cardiovascular;  Laterality: N/A;   AUGMENTATION MAMMAPLASTY  2003   BREAST BIOPSY Right 2005   benign   COLONOSCOPY     DILATATION & CURETTAGE/HYSTEROSCOPY WITH MYOSURE N/A 01/02/2018   Procedure: DILATATION & CURETTAGE/HYSTEROSCOPY WITH MYOSURE, polypectomy;  Surgeon: Christeen Douglas, MD;  Location: [facility] ORS;  Service: Gynecology;  Laterality: N/A;   ESOPHAGOGASTRODUODENOSCOPY     KNEE ARTHROSCOPY Left 12/02/2017   Procedure: ARTHROSCOPY KNEE WITH PARTIAL MEDIAL MENISECTOMY, REMOVAL OF LOOSE BODY;  Surgeon: Signa Kell,  MD;  Location:  [facility] OR[facility];  Service: Orthopedics;  Laterality: Left;   REDUCTION MAMMAPLASTY  2007   TUMMY TUCK  2006    Current Outpatient Medications  Medication Sig Dispense Refill   acetaminophen (TYLENOL) 500 MG tablet Take 500 mg by mouth daily as needed for moderate pain (pain score 4-6), headache or fever.     ALPRAZolam (XANAX) 0.5 MG tablet Take 0.5-1 tablets (0.25-0.5 mg total) by mouth at bedtime as needed for anxiety. 30 tablet 0   apixaban (ELIQUIS) 5 MG TABS tablet Take 1 tablet (5 mg total) by mouth 2 (two) times daily. 60 tablet 5   flecainide (TAMBOCOR) 50 MG tablet Take 1 tablet (50 mg total) by mouth 2 (two) times daily. 60 tablet 5   MAGNESIUM PO Take 1 tablet by mouth at bedtime.     metoprolol succinate (TOPROL XL) 25 MG 24 hr tablet Take 1 tablet (25 mg total) by mouth at bedtime. 90 tablet 3   No current facility-administered medications for this encounter.    Allergies  Allergen Reactions   Codeine Nausea And Vomiting   Ivp Dye [Iodinated Contrast Media] Itching    ROS- All systems are reviewed and negative except as per the HPI above  Physical Exam: Vitals:   01/17/23 1027  BP: 124/70  Pulse: 64  Weight: 97.2 kg  Height: 5\' 5"  (1.651 m)     Wt Readings from Last 3 Encounters:  01/17/23 97.2 kg  12/18/22 95.2 kg  11/10/22 96.4 kg    Labs: Lab Results  Component Value Date   NA 138 12/19/2022   K 3.8 12/19/2022   CL 105 12/19/2022   CO2 25 12/19/2022   GLUCOSE 102 (H) 12/19/2022   BUN 14 12/19/2022   CREATININE 0.97 12/19/2022   CALCIUM 8.4 (L) 12/19/2022   MG 2.0 12/18/2022   Lab Results  Component Value Date   INR 1.13 08/17/2017   Lab Results  Component Value Date   CHOL 172 11/19/2021   HDL 38 (L) 11/19/2021   LDLCALC 115 (H) 11/19/2021   TRIG 94 11/19/2021   GEN- The patient is well appearing, alert and oriented x 3 today.   Neck - no JVD or carotid bruit noted Lungs- Clear to ausculation bilaterally, normal work of breathing Heart-  Regular rate and rhythm, no murmurs, rubs or gallops, PMI not laterally displaced Extremities- no clubbing, cyanosis, or edema Skin - no rash or ecchymosis noted   EKG -  Vent. rate 64 BPM PR interval 150 ms QRS duration 92 ms QT/QTcB 432/445 ms P-R-T axes 60 -7 56 Normal sinus rhythm Normal ECG When compared with ECG of 19-Dec-2022 07:38, PREVIOUS ECG IS PRESENT  Echo 01/21/22: 1. Left ventricular ejection fraction, by estimation, is 55 to 60%. The  left ventricle has normal function. The left ventricle has no regional  wall motion abnormalities. Left ventricular diastolic parameters were  normal.   2. Right ventricular systolic function is normal. The right ventricular  size is normal.   3. The mitral valve is normal in structure. Trivial mitral valve  regurgitation. No evidence of mitral stenosis.   4. The aortic valve is normal in structure. Aortic valve regurgitation is  not visualized. No aortic stenosis is present.   5. The inferior vena cava is normal in size with greater than 50%  respiratory variability, suggesting right atrial pressure of 3 mmHg.   Assessment and Plan: 1.Symptomatic paroxysmal afib with RVR S/p ablation on 04/23/22. S/p successful DCCV in  the ED on 06/28/22. S/p successful DCCV in the ED on 09/27/22. S/p hospital admission 11/23-24/24 for Afib with RVR.  She is currently in NSR. She preferred to be discharged on flecainide and to schedule Tikosyn load as outpatient. After discussion, she would like to remain on flecainide and does not wish to pursue Tikosyn load.   Intervals are stable. Continue flecainide 50 mg BID. Continue Toprol 25 mg daily. Okay to stop Eliquis due to low risk score.    Follow up 6 months for flecainide surveillance.   Lake Bells, PA-C [facility] [address] [place], [place] [address] [phone]

## 2023-01-22 DIAGNOSIS — G4733 Obstructive sleep apnea (adult) (pediatric): Secondary | ICD-10-CM | POA: Diagnosis not present

## 2023-02-11 ENCOUNTER — Ambulatory Visit
Admission: RE | Admit: 2023-02-11 | Discharge: 2023-02-11 | Disposition: A | Discharge status: Home / Self Care | Payer: 59 | Source: Ambulatory Visit | Attending: Physician Assistant | Admitting: Physician Assistant

## 2023-02-11 DIAGNOSIS — Z1231 Encounter for screening mammogram for malignant neoplasm of breast: Secondary | ICD-10-CM | POA: Diagnosis not present

## 2023-04-20 DIAGNOSIS — R5383 Other fatigue: Secondary | ICD-10-CM | POA: Diagnosis not present

## 2023-04-20 DIAGNOSIS — G4733 Obstructive sleep apnea (adult) (pediatric): Secondary | ICD-10-CM | POA: Diagnosis not present

## 2023-04-20 DIAGNOSIS — E538 Deficiency of other specified B group vitamins: Secondary | ICD-10-CM | POA: Diagnosis not present

## 2023-05-11 ENCOUNTER — Ambulatory Visit (HOSPITAL_COMMUNITY): Payer: 59 | Admitting: Internal Medicine

## 2023-06-18 DIAGNOSIS — H6692 Otitis media, unspecified, left ear: Secondary | ICD-10-CM | POA: Diagnosis not present

## 2023-07-14 ENCOUNTER — Ambulatory Visit (HOSPITAL_COMMUNITY): Payer: 59 | Attending: Internal Medicine | Admitting: Internal Medicine

## 2023-07-14 ENCOUNTER — Encounter (HOSPITAL_COMMUNITY): Payer: Self-pay

## 2023-07-14 NOTE — Progress Notes (Incomplete)
 Primary Care Physician: Delmus Ferri, PA Referring Physician:  Dr. Mariam Shingles is a 63 y.o. female with a h/o paroxysmal afib, sleep apnea, treated with CPAP that is in the afib clinic for f/u hospitalization for afib while in [place], [place] to attend a [place] rally in 2019. She was walking to her car and  was aware of increased HR, dizziness and lightheadedness and was taken to [facility].  She had been out in the sun for about 30 mins and her fluid intake had been less than usual  for the day. Her HR was 170 bpm. She was thought to have mild dehydration.She was admitted, placed on Cardizem  drip for a couple of days then started on Multaq , ranexa , atenolol , eliquis  and did convert to SR prior to discharge. The MD told pt that she would need quick f/u as multaq  was for short term as it could cause liver failure. She is planning to see Dr. Nunzio Belch soon. She would like to discuss coming off drugs and a possible ablation down the line. CHA2DS2VASc score is 1 for female. She would like to get clearance to have her knee surgery as she wants this first so she can get back to exercising to obtain wight loss. Echo was done at [facility] and with normal results( all records can be found in Care Everywhere).   She has had afib for many years but it had been quiet, treated in the [place] area until she moved to [place] 6 years ago. She saw Dr. Parks Bollman a couple of times but has not seen him since 2015.  She was suppose to have knee surgery last week but it was cancelled.  She states that she has gained  40 lbs over the last 6 years. She does use cpap, but still sleeps very poorly. Feels that she does get at least 4 hours a night use.  F/u in afib clinic 9/9. When she saw Dr. Nunzio Belch, he advised her to start weaning off the ranexa , multaq  and DOAC which she has successfully done. She  feels well. No further afib. She did have a SBO which self released right after I saw her last in  clinic. No further reoccurrence.   F/u in afib clinic 02/22/18, as she had a ER visit for afib and went to the ER for a cardioversion. She had multaq  and eliquis  on hand and took these but did not help to restore SR.  The cardioversion was successful and she is in SR today. She still would prefer to stay off daily meds . She has talked to Dr. Nunzio Belch about an ablation in the past but is still not quite ready for this either unless afib burden increases. She is seeing a weight loss specialist. She has not used her cpap for sleep apnea in some time, cannot tolerate mask. She thinks the trigger was lack of sleep and working long hours.   F/u in AF clinic 07/11/18. Patient reports that last night she began having feelings of SOB, weakness, and heart racing. In office today she is in afib with RVR and is very SOB, especially when walking. She has not passed out but has also been very lightheaded. She is not on Eliquis  or Multaq  at this time.  She was sent to the ER for urgent cardioversion.  F/u afib clinic, 07/21/18. She is s/p successful  CV and is staying in rhythm. She remains on Multaq  and Eliquis  but does not want to  stay on daily long term. She cannot tolerate the cpap and states that she sleeps poorly. She has stopped going to the weight loss MD as it was inconvenient for her in [place]. She has gained weight with Covid and working from home.   F/u in afib clinic, 03/30/19. She  had return of rapid afib last night with difficulty breathing and lightheadedness and asked to be seen today. She  in in afib at 138 bpm. BP soft around 100 systolic.  She is not on anticoagulation  for a CHA2DS2VASc score of 1. Last time I saw her in June she required an urgent cardioversion. Was on Multaq  for a while but stopped in August 2020. She takes anticoagulation for the 4 weeks after cardioversion but then stops drug per protocol.  She did have an episode last week for a few hours of elevated HR but self converted. Discussed with  Dr. Nunzio Belch and he felt she should proceed to the ER for an urgent cardioversion as she is not on anticoagulation and is in the 48 hours of onset afib. Has not used cpap for OSA in past.  F/u in afib clinic, 04/13/19.  after successful cardioversion in the ER. She continues on eliquis  5 mg bid for post cardioversion protocol. Discussion today if she prefers to go back on Multaq  bid to help prevent afib as she has had 2 cardioversions 6 months apart, or if she wants to discuss ablation with Dr. Nunzio Belch. She prefers to discuss with Dr. Nunzio Belch as she does not like to take meds. Will need updated echo.   F/u in afib clinic, 01/07/20. She had an appointment with Dr. Nunzio Belch 3 months ago but no change in approach to afib management was made. She is in SR today. No  further afib episodes to report. She is in SR today. She continues on atenolol ,  CHA2DS2VASc score of 1, on daily asa and off eliquis  after 4 weeks following last  cardioversion. She previously took Multaq , it worked well but she did not want to continue daily meds. She continues use of CPAP. She would prefer not to pursue ablation at this time.   F/u in afib clinic, 04/23/19. She is now one month s/p afib ablation and is very happy with the results. She has not noted any afib. No swallowing or groin issues. She is back to her usual activities. Remains on eliquis  5 mg bid for a CHA2DS2VASc score of 1.   F/u in afib clinic, 04/01/21, as she reported having 2-3 breakthrough afib episodes. The longest was 3 days. She was under some additional stress at that time. She is in SR today. Sheis not oon anticoagulation by guidelines with a CHA2DS2VASc  score of 1.   F/u in afib clinic, 12/22/21, after having to report to ED for a heart rate in afib around 180 bpm and palpable BP at 90 systolic and being symptomatic with presyncope. She was started on anticoagulation and converted with IV cardizem . She has not had any further afib. She is pending an appointment with  Dr. Marven Slimmer 12/20 to discuss second ablation. With a CHA2DS2VASc  score of 1, she wishes to stop anticoagulation since it has been one month since she chemically returned to SR, knowing if she has an ablation she will need to start it back 3 weeks prior.   On follow up 05/21/22, she is currently in SR. She is now s/p Afib ablation and ablation of an atrial tachycardia in the right atrium on 04/23/22 by  Dr. Marven Slimmer. She was on flecainide  as a bridge prior to ablation and this was stopped after ablation. She went to the ED on 4/2 for acute left groin pain with no evidence of pseudoaneurysm or cellulitis and was begun on doxy and Keflex  for empiric coverage. Eliquis  held for 2 days due to new hematoma. Called 4/4 with pain under left breast and this was monitored. Seen by EP on 4/11 with improved groin pain and plan for no further antibiotic therapy.  Since OV on 4/11, she has been doing well. No episodes of Afib. Left groin has healed and she has no pain or fevers. Pain under left breast resolved. She is currently taking Lopressor  12.5 mg BID. No missed doses of Eliquis .   On follow up 06/28/22, she appears to be in atrial flutter with RVR. She took 1 dose of metoprolol  about 2 hours prior to office visit. She can feel palpitations and short of breath with exertion. No missed doses of Eliquis .  On follow up 11/10/22, she is currently in NSR. She is now s/p 2 ED visits on 6/3 and 9/2 which required cardioversion. She was seen by Dr. Marven Slimmer on 6/27 after our last visit (which prompted first ED cardioversion) and he recommended dofetilide should recurrence occur. No missed doses of Eliquis . She is very hesitant to proceed with dofetilide because she does not want to take additional medication. She has very infrequent alcohol intake, only social, and has not used her CPAP in a while.   On follow up 01/17/23, she is currently in NSR. Recent hospital admission 11/23-24/24 for Afib with RVR. She declined Tikosyn load  while in hospital and preferred to be restarted on flecainide ; notes show she preferred to schedule Tikosyn load as outpatient. She converted to NSR on IV diltiazem . She was discharged on flecainide  50 mg BID and Toprol  25 mg daily. She restarted Eliquis  5 mg BID immediately after noting she went into Afib. She has had zero episodes of Afib since hospital discharge. No missed doses of Eliquis .   On follow up 07/14/23, patient is here for flecainide  surveillance. She is currently in ***. She has had *** Afib burden since last office visit. She is using CPAP regularly. No missed doses of flecainide .  Today, she denies symptoms of palpitations, chest pain,+ shortness of breath/lightedness, no orthopnea, PND, lower extremity edema, dizziness, presyncope, syncope, or neurologic sequela. The patient is tolerating medications without difficulties and is otherwise without complaint today.   Past Medical History:  Diagnosis Date   Anxiety    B12 deficiency    Difficult intubation    SMALL TRACHEA   GERD (gastroesophageal reflux disease)    History of kidney stones    H/O   OSA (obstructive sleep apnea)    USES CPAP ( rarely)   Paroxysmal atrial fibrillation (HCC)    Past Surgical History:  Procedure Laterality Date   ATRIAL FIBRILLATION ABLATION N/A 03/25/2020   Procedure: ATRIAL FIBRILLATION ABLATION;  Surgeon: Jolly Needle, MD;  Location: [facility];  Service: Cardiovascular;  Laterality: N/A;   ATRIAL FIBRILLATION ABLATION N/A 04/23/2022   Procedure: ATRIAL FIBRILLATION ABLATION;  Surgeon: Boyce Byes, MD;  Location: [facility] CV [facility];  Service: Cardiovascular;  Laterality: N/A;   AUGMENTATION MAMMAPLASTY  2003   BREAST BIOPSY Right 2005   benign   COLONOSCOPY     DILATATION & CURETTAGE/HYSTEROSCOPY WITH MYOSURE N/A 01/02/2018   Procedure: DILATATION & CURETTAGE/HYSTEROSCOPY WITH MYOSURE, polypectomy;  Surgeon: Prescilla Brod, MD;  Location:  [facility] ORS;  Service: Gynecology;   Laterality: N/A;   ESOPHAGOGASTRODUODENOSCOPY     KNEE ARTHROSCOPY Left 12/02/2017   Procedure: ARTHROSCOPY KNEE WITH PARTIAL MEDIAL MENISECTOMY, REMOVAL OF LOOSE BODY;  Surgeon: Lorri Rota, MD;  Location: [facility] ORS;  Service: Orthopedics;  Laterality: Left;   REDUCTION MAMMAPLASTY  2007   TUMMY TUCK  2006    Current Outpatient Medications  Medication Sig Dispense Refill   acetaminophen  (TYLENOL ) 500 MG tablet Take 500 mg by mouth daily as needed for moderate pain (pain score 4-6), headache or fever.     ALPRAZolam  (XANAX ) 0.5 MG tablet Take 0.5-1 tablets (0.25-0.5 mg total) by mouth at bedtime as needed for anxiety. 30 tablet 0   apixaban  (ELIQUIS ) 5 MG TABS tablet Take 1 tablet (5 mg total) by mouth 2 (two) times daily. 60 tablet 5   flecainide  (TAMBOCOR ) 50 MG tablet Take 1 tablet (50 mg total) by mouth 2 (two) times daily. 60 tablet 5   MAGNESIUM  PO Take 1 tablet by mouth at bedtime.     metoprolol  succinate (TOPROL  XL) 25 MG 24 hr tablet Take 1 tablet (25 mg total) by mouth at bedtime. 90 tablet 3   No current facility-administered medications for this visit.    Allergies  Allergen Reactions   Codeine Nausea And Vomiting   Ivp Dye [Iodinated Contrast Media] Itching    ROS- All systems are reviewed and negative except as per the HPI above  Physical Exam: There were no vitals filed for this visit.    Wt Readings from Last 3 Encounters:  01/17/23 97.2 kg  12/18/22 95.2 kg  11/10/22 96.4 kg    Labs: Lab Results  Component Value Date   NA 138 12/19/2022   K 3.8 12/19/2022   CL 105 12/19/2022   CO2 25 12/19/2022   GLUCOSE 102 (H) 12/19/2022   BUN 14 12/19/2022   CREATININE 0.97 12/19/2022   CALCIUM 8.4 (L) 12/19/2022   MG 2.0 12/18/2022   Lab Results  Component Value Date   INR 1.13 08/17/2017   Lab Results  Component Value Date   CHOL 172 11/19/2021   HDL 38 (L) 11/19/2021   LDLCALC 115 (H) 11/19/2021   TRIG 94 11/19/2021   GEN- The patient is well  appearing, alert and oriented x 3 today.   Neck - no JVD or carotid bruit noted Lungs- Clear to ausculation bilaterally, normal work of breathing Heart- ***Regular rate and rhythm, no murmurs, rubs or gallops, PMI not laterally displaced Extremities- no clubbing, cyanosis, or edema Skin - no rash or ecchymosis noted   EKG -  ***  Echo 01/21/22: 1. Left ventricular ejection fraction, by estimation, is 55 to 60%. The  left ventricle has normal function. The left ventricle has no regional  wall motion abnormalities. Left ventricular diastolic parameters were  normal.   2. Right ventricular systolic function is normal. The right ventricular  size is normal.   3. The mitral valve is normal in structure. Trivial mitral valve  regurgitation. No evidence of mitral stenosis.   4. The aortic valve is normal in structure. Aortic valve regurgitation is  not visualized. No aortic stenosis is present.   5. The inferior vena cava is normal in size with greater than 50%  respiratory variability, suggesting right atrial pressure of 3 mmHg.   CHA2DS2-VASc Score = 1  The patient's score is based upon: CHF History: 0 HTN History: 0 Diabetes History: 0 Stroke History: 0 Vascular Disease History: 0 Age Score:  0 Gender Score: 1   {Confirm score is correct.  If not, click here to update score.  REFRESH note.  :1}    Assessment and Plan: 1.Symptomatic paroxysmal afib with RVR S/p ablation on 04/23/22. S/p successful DCCV in the ED on 06/28/22. S/p successful DCCV in the ED on 09/27/22. S/p hospital admission 11/23-24/24 for Afib with RVR.  She is currently in ***. She is not on OAC due to low risk score.   High risk medication monitoring (ICD10: [id number]) Patient requires ongoing monitoring for anti-arrhythmic medication which has the potential to cause life threatening arrhythmias or AV block. ECG intervals are stable. Continue flecainide  50 mg BID.  Continue Toprol  25 mg daily.     Follow  up 6 months for flecainide  surveillance.    Minnie Amber, PA-C [facility] [address] [place], [place] [address] [phone]

## 2023-08-11 ENCOUNTER — Telehealth: Payer: Self-pay | Admitting: Cardiology

## 2023-08-11 NOTE — Telephone Encounter (Signed)
 *  STAT* If patient is at the pharmacy, call can be transferred to refill team.   1. Which medications need to be refilled? (please list name of each medication and dose if known)   flecainide  (TAMBOCOR ) 50 MG tablet   2. Which pharmacy/location (including street and city if local pharmacy) is medication to be sent to?  [facility], [address] [place] [phone]   3. Do they need a 30 day or 90 day supply?   Patient is in [place] with family and forgot her pills. Could she get a emergency script for 8 pills. Patient is completely out and she does not have one for tonight.

## 2023-08-12 MED ORDER — FLECAINIDE ACETATE 50 MG PO TABS: 50.0000 mg | ORAL_TABLET | Freq: Two times a day (BID) | ORAL | 5 refills | Status: DC

## 2023-08-12 NOTE — Telephone Encounter (Signed)
This is a A-Fib clinic pt 

## 2023-08-12 NOTE — Telephone Encounter (Signed)
 Rx sent.

## 2023-08-12 NOTE — Telephone Encounter (Signed)
Pt calling for a update

## 2023-09-22 ENCOUNTER — Emergency Department: Admission: EM | Admit: 2023-09-22 | Discharge: 2023-09-22 | Disposition: A | Discharge status: Home / Self Care

## 2023-09-22 ENCOUNTER — Emergency Department

## 2023-09-22 ENCOUNTER — Other Ambulatory Visit: Payer: Self-pay

## 2023-09-22 DIAGNOSIS — R109 Unspecified abdominal pain: Secondary | ICD-10-CM | POA: Diagnosis not present

## 2023-09-22 DIAGNOSIS — R1084 Generalized abdominal pain: Secondary | ICD-10-CM | POA: Diagnosis not present

## 2023-09-22 DIAGNOSIS — R21 Rash and other nonspecific skin eruption: Secondary | ICD-10-CM | POA: Diagnosis not present

## 2023-09-22 DIAGNOSIS — K5730 Diverticulosis of large intestine without perforation or abscess without bleeding: Secondary | ICD-10-CM | POA: Diagnosis not present

## 2023-09-22 DIAGNOSIS — N200 Calculus of kidney: Secondary | ICD-10-CM

## 2023-09-22 DIAGNOSIS — K8020 Calculus of gallbladder without cholecystitis without obstruction: Secondary | ICD-10-CM | POA: Diagnosis not present

## 2023-09-22 DIAGNOSIS — N134 Hydroureter: Secondary | ICD-10-CM | POA: Diagnosis not present

## 2023-09-22 DIAGNOSIS — N132 Hydronephrosis with renal and ureteral calculous obstruction: Secondary | ICD-10-CM | POA: Diagnosis not present

## 2023-09-22 DIAGNOSIS — R9431 Abnormal electrocardiogram [ECG] [EKG]: Secondary | ICD-10-CM | POA: Diagnosis not present

## 2023-09-22 LAB — COMPREHENSIVE METABOLIC PANEL WITH GFR
ALT: 15 U/L (ref 0–44)
AST: 19 U/L (ref 15–41)
Albumin: 3.5 g/dL (ref 3.5–5.0)
Alkaline Phosphatase: 60 U/L (ref 38–126)
Anion gap: 12 (ref 5–15)
BUN: 22 mg/dL (ref 8–23)
CO2: 21 mmol/L — ABNORMAL LOW (ref 22–32)
Calcium: 8.4 mg/dL — ABNORMAL LOW (ref 8.9–10.3)
Chloride: 106 mmol/L (ref 98–111)
Creatinine, Ser: 0.95 mg/dL (ref 0.44–1.00)
GFR, Estimated: >60 mL/min (ref >60)
Glucose, Bld: 123 mg/dL — ABNORMAL HIGH (ref 70–99)
Potassium: 4.2 mmol/L (ref 3.5–5.1)
Sodium: 139 mmol/L (ref 135–145)
Total Bilirubin: 0.5 mg/dL (ref 0.0–1.2)
Total Protein: 6.1 g/dL — ABNORMAL LOW (ref 6.5–8.1)

## 2023-09-22 LAB — URINALYSIS, ROUTINE W REFLEX MICROSCOPIC
Bacteria, UA: NONE SEEN
Bilirubin Urine: NEGATIVE
Glucose, UA: NEGATIVE mg/dL
Ketones, ur: 20 mg/dL — AB
Leukocytes,Ua: NEGATIVE
Nitrite: NEGATIVE
Protein, ur: NEGATIVE mg/dL
Specific Gravity, Urine: 1.017 (ref 1.005–1.030)
pH: 6 (ref 5.0–8.0)

## 2023-09-22 LAB — CBC WITH DIFFERENTIAL/PLATELET
Abs Immature Granulocytes: 0.05 K/uL (ref 0.00–0.07)
Basophils Absolute: 0 K/uL (ref 0.0–0.1)
Basophils Relative: 0 %
Eosinophils Absolute: 0 K/uL (ref 0.0–0.5)
Eosinophils Relative: 0 %
HCT: 35.7 % — ABNORMAL LOW (ref 36.0–46.0)
Hemoglobin: 11.5 g/dL — ABNORMAL LOW (ref 12.0–15.0)
Immature Granulocytes: 0 %
Lymphocytes Relative: 13 %
Lymphs Abs: 1.5 K/uL (ref 0.7–4.0)
MCH: 29.5 pg (ref 26.0–34.0)
MCHC: 32.2 g/dL (ref 30.0–36.0)
MCV: 91.5 fL (ref 80.0–100.0)
Monocytes Absolute: 0.4 K/uL (ref 0.1–1.0)
Monocytes Relative: 4 %
Neutro Abs: 9.4 K/uL — ABNORMAL HIGH (ref 1.7–7.7)
Neutrophils Relative %: 83 %
Platelets: 255 K/uL (ref 150–400)
RBC: 3.9 MIL/uL (ref 3.87–5.11)
RDW: 12.4 % (ref 11.5–15.5)
WBC: 11.4 K/uL — ABNORMAL HIGH (ref 4.0–10.5)
nRBC: 0 % (ref 0.0–0.2)

## 2023-09-22 LAB — LIPASE, BLOOD: Lipase: 29 U/L (ref 11–51)

## 2023-09-22 MED ORDER — ONDANSETRON HCL 4 MG/2ML IJ SOLN
INTRAMUSCULAR | Status: DC
Start: 2023-09-22 — End: 2023-09-23
  Filled 2023-09-22: qty 2

## 2023-09-22 MED ORDER — SODIUM CHLORIDE 0.9 % IV BOLUS
500.0000 mL | Freq: Once | INTRAVENOUS | Status: AC
  Administered 2023-09-22: 500 mL via INTRAVENOUS

## 2023-09-22 MED ORDER — KETOROLAC TROMETHAMINE 15 MG/ML IJ SOLN
15.0000 mg | Freq: Once | INTRAMUSCULAR | Status: AC
  Administered 2023-09-22: 15 mg via INTRAVENOUS
  Filled 2023-09-22: qty 1

## 2023-09-22 MED ORDER — ONDANSETRON HCL 4 MG/2ML IJ SOLN
4.0000 mg | Freq: Once | INTRAMUSCULAR | Status: AC
  Administered 2023-09-22: 4 mg via INTRAVENOUS

## 2023-09-22 MED ORDER — OXYCODONE-ACETAMINOPHEN 5-325 MG PO TABS
1.0000 | ORAL_TABLET | Freq: Once | ORAL | Status: AC
  Administered 2023-09-22: 1 via ORAL
  Filled 2023-09-22: qty 1

## 2023-09-22 MED ORDER — SODIUM CHLORIDE 0.9 % IV BOLUS
1000.0000 mL | Freq: Once | INTRAVENOUS | Status: AC
  Administered 2023-09-22: 1000 mL via INTRAVENOUS

## 2023-09-22 MED ORDER — FENTANYL CITRATE PF 50 MCG/ML IJ SOSY
50.0000 ug | PREFILLED_SYRINGE | Freq: Once | INTRAMUSCULAR | Status: AC
  Administered 2023-09-22: 50 ug via INTRAVENOUS
  Filled 2023-09-22: qty 1

## 2023-09-22 MED ORDER — HYDROMORPHONE HCL 1 MG/ML IJ SOLN
0.5000 mg | Freq: Once | INTRAMUSCULAR | Status: AC
  Administered 2023-09-22: 0.5 mg via INTRAVENOUS
  Filled 2023-09-22: qty 0.5

## 2023-09-22 MED ORDER — OXYCODONE-ACETAMINOPHEN 5-325 MG PO TABS
1.0000 | ORAL_TABLET | ORAL | 0 refills | Status: AC | PRN
Start: 2023-09-22 — End: 2023-09-25

## 2023-09-22 MED ORDER — TAMSULOSIN HCL 0.4 MG PO CAPS
0.4000 mg | ORAL_CAPSULE | Freq: Once | ORAL | Status: AC
  Administered 2023-09-22: 0.4 mg via ORAL
  Filled 2023-09-22: qty 1

## 2023-09-22 NOTE — ED Notes (Signed)
 Pt to radiology at this time.

## 2023-09-22 NOTE — ED Provider Notes (Signed)
 [facility] Provider Note    Event Date/Time   First MD Initiated Contact with Patient 09/22/23 1952     (approximate)   History   Flank Pain   HPI  Tracey Lin is a 63 y.o. female  o. female with past medical history of persistent atrial fibrillation (s/p ablation in 03/2020 and 03/2022), OSA (on CPAP) who presents with 24 hours of right flank pain in the setting of several days of constipation and feeling unwell.  Patient states that she was traveling earlier this week and felt constipated.  Her daughter who is a Engineer, civil (consulting) gave all several enemas and magnesium .  This morning she traveled back and awoke with right-sided flank pain that has progressively worsened.  There has been associated nausea and vomiting and she has noticed that her face has become slightly red.  Denies any neck pain but tells me that she is intermittently had heartburn symptoms for the past week.  Denies any dysuria or hematuria.  Has no prior history of abdominal surgeries.  She presents with her friend who contributes to history      Physical Exam   Triage Vital Signs: ED Triage Vitals  Encounter Vitals Group     BP 09/22/23 1944 (!) 181/135     Girls Systolic BP Percentile --      Girls Diastolic BP Percentile --      Boys Systolic BP Percentile --      Boys Diastolic BP Percentile --      Pulse Rate 09/22/23 1944 66     Resp 09/22/23 1944 (!) 22     Temp 09/22/23 1944 98.4 F (36.9 C)     Temp src --      SpO2 09/22/23 1944 100 %     Weight 09/22/23 1943 210 lb (95.3 kg)     Height 09/22/23 1943 5' 5 (1.651 m)     Head Circumference --      Peak Flow --      Pain Score 09/22/23 1943 10     Pain Loc --      Pain Education --      Exclude from Growth Chart --     Most recent vital signs: Vitals:   09/22/23 2230 09/22/23 2300  BP: (!) 161/84 (!) 162/89  Pulse: 61 66  Resp: 13 12  Temp:    SpO2: 100% 100%    Nursing Triage Note reviewed. Vital signs reviewed  and patients oxygen saturation is normoxic  General: Patient is well nourished, well developed, awake and alert, crying out, yelling, holding abdomin  Head: Normocephalic and atraumatic Eyes: Normal inspection, extraocular muscles intact, no conjunctival pallor Ear, nose, throat: Normal external exam Neck: Normal range of motion, full range of motion without pain no crepitus Respiratory: Patient is in no respiratory distress, lungs CTAB Cardiovascular: Patient is not tachycardic, RRR without murmur appreciated GI: Abdomen soft, tender to palpation on the right side and right flank Back: Normal inspection of the back with good strength and range of motion throughout all ext Extremities: pulses intact with good cap refills, no LE pitting edema or calf tenderness Neuro: The patient is alert and oriented to person, place, and time, appropriately conversive, with 5/5 bilat UE/LE strength, no gross motor or sensory defects noted. Coordination appears to be adequate. Skin: Face with telangecta, but no crepitus Psych: normal mood and affect, no SI or HI  ED Results / Procedures / Treatments   Labs (all labs ordered  are listed, but only abnormal results are displayed) Labs Reviewed  CBC WITH DIFFERENTIAL/PLATELET - Abnormal; Notable for the following components:      Result Value   WBC 11.4 (*)    Hemoglobin 11.5 (*)    HCT 35.7 (*)    Neutro Abs 9.4 (*)    All other components within normal limits  COMPREHENSIVE METABOLIC PANEL WITH GFR - Abnormal; Notable for the following components:   CO2 21 (*)    Glucose, Bld 123 (*)    Calcium 8.4 (*)    Total Protein 6.1 (*)    All other components within normal limits  URINALYSIS, ROUTINE W REFLEX MICROSCOPIC - Abnormal; Notable for the following components:   Color, Urine YELLOW (*)    APPearance HAZY (*)    Hgb urine dipstick LARGE (*)    Ketones, ur 20 (*)    All other components within normal limits  LIPASE, BLOOD     EKG EKG and  rhythm strip are interpreted by myself:   EKG: [Normal sinus rhythm] at heart rate of 56, normal QRS duration, QTc 447, nonspecific ST segments and T waves no ectopy EKG not consistent with Acute STEMI Rhythm strip: NSR in lead II   RADIOLOGY CT abd and pelvis without contrast: Right distal ureteral kidney stone radiologist agrees, this does appear obstructive without significant hydronephrosis Chest x-ray; patient declined after explaining the indications benefits risks and alternatives of such    PROCEDURES:  Critical Care performed: No  Procedures   MEDICATIONS ORDERED IN ED: Medications  HYDROmorphone  (DILAUDID ) injection 0.5 mg (0.5 mg Intravenous Given 09/22/23 2017)  sodium chloride  0.9 % bolus 1,000 mL (0 mLs Intravenous Stopped 09/22/23 2215)  ondansetron  (ZOFRAN ) injection 4 mg (4 mg Intravenous Given 09/22/23 2017)  fentaNYL  (SUBLIMAZE ) injection 50 mcg (50 mcg Intravenous Given 09/22/23 2043)  ketorolac  (TORADOL ) 15 MG/ML injection 15 mg (15 mg Intravenous Given 09/22/23 2106)  tamsulosin  (FLOMAX ) capsule 0.4 mg (0.4 mg Oral Given 09/22/23 2108)  sodium chloride  0.9 % bolus 500 mL (0 mLs Intravenous Stopped 09/22/23 2215)  oxyCODONE -acetaminophen  (PERCOCET/ROXICET) 5-325 MG per tablet 1 tablet (1 tablet Oral Given 09/22/23 2255)     IMPRESSION / MDM / ASSESSMENT AND PLAN / ED COURSE                                Differential diagnosis includes, but is not limited to, nephrolithiasis, pyelonephritis, SBO, colitis, electrolyte derangement, anemia, low-lying pneumonia  ED course: Patient presents screaming in pain and yelling.  Abdomen demonstrated no evidence of peritonitis.  Multimodal pain control initiated with Dilaudid  fentanyl  IV fluids.  CT abdomen pelvis completed without contrast given patient allergy which demonstrated no evidence of small bowel obstruction but was consistent with obstructive stone in the distal ureter.  Likely she had no renal insufficiency or  evidence of infection.  Patient felt improved on repeat exam abdomen is completely benign.  I considered sending the patient home with Flomax  however given patient's flecainide  I do not want to start him on this.  She was counseled to follow-up with primary care physician and possible dermatologist given the new facial rash.  Will send the patient home with a small course of Percocet should she have mild recurrence of the symptoms.  Return precautions reviewed and patient and friend voiced understanding and requested discharge   Clinical Course as of 09/22/23 2314  Thu Sep 22, 2023  2034 Patient back  from CT and still endorsing pain will order a dose of fentanyl  [HD]  2040 Chest x-ray was ordered for low-lying pneumonia.  Patient declines this and states that she does not want a chest x-ray despite stating over notable that she has some shortness of breath as well.  I explained reasoning however I do think the patient has capacity to make this decision [HD]  2224 Creatinine: 0.95 No acute renal insufficiency [HD]  2225 WBC(!): 11.4 Only a mild leukocytosis [HD]  2225 CT ABDOMEN PELVIS WO CONTRAST  5 mm stone in the distal right ureter with moderate proximal obstruction.    [HD]  2237 Urinalysis, Routine w reflex microscopic -Urine, Clean Catch(!) Not consistent with UTI will reevaluate the patient [HD]  2252 Patient feels improved.  We reviewed all the return precautions.  At this time I do not have a cause for the facial discoloration however patient has no neck pain or crepitus denies any chest pain.  We reviewed return precautions.  Will send a small prescription of Percocet to her pharmacy of choice. [HD]    Clinical Course User Index [HD] Nicholaus Rolland BRAVO, MD   At time of discharge there is no evidence of acute life, limb, vision, or fertility threat. Patient has stable vital signs, pain is well controlled, patient is ambulatory and p.o. tolerant.  Discharge instructions were completed  using the [employer] system. I would refer you to those at this time. All warnings prescriptions follow-up etc. were discussed in detail with the patient. Patient indicates understanding and is agreeable with this plan. All questions answered.  Patient is made aware that they may return to the emergency department for any worsening or new condition or for any other emergency. -- Risk: 5 This patient has a high risk of morbidity due to further diagnostic testing or treatment. Rationale: This patient's evaluation and management involve a high risk of morbidity due to the potential severity of presenting symptoms, need for diagnostic testing, and/or initiation of treatment that may require close monitoring. The differential includes conditions with potential for significant deterioration or requiring escalation of care. Treatment decisions in the ED, including medication administration, procedural interventions, or disposition planning, reflect this level of risk. Additional Support: -- Drug therapy requiring intensive monitoring for toxicity [ ]  -- Decision regarding elective major surgery with idenitified patient or procedure risk factors [ ]  -- Decision regarding hospitalization or escalation of hospital-level care [ ]  -- Decision not to resuscitate or to de-escalate care because of poor prognosis [ ]  -- Parental controlled substances X Dilaudid  and IV fentanyl   COPA: 5 The patient has a severe exacerbation, progression, or side effect of treatment of the following illness/illnesses: []  OR  The patient has the following acute or chronic illness/injury that poses a possible threat to life or bodily function: [X] : The patient has a potentially serious acute condition or an acute exacerbation of a chronic illness requiring urgent evaluation and management in the Emergency Department. The clinical presentation necessitates immediate consideration of life-threatening or function-threatening diagnoses, even  if they are ultimately ruled out.  Data(2/3 categories following were performed): 5 I reviewed or ordered at least three unique tests, external notes, and/or the history required an independent historian as one of the three requirements as following: CBC, CMP, urinalysis AND  I independently interpreted the following test: CT abdomen pelvis without contrast OR  I discussed the management of the patient with the following external physician or qualified healthcare provider: []    Suggested E/M Coding  Level: 5, 99285, This has been selected based on the 2021-10-07 CPT guidelines for E/M codes in the Emergency Department based on 2/3 of the CoPA, Data, and Risk.   FINAL CLINICAL IMPRESSION(S) / ED DIAGNOSES   Final diagnoses:  Nephrolithiasis  Abdominal pain, unspecified abdominal location  Facial rash     Rx / DC Orders   ED Discharge Orders          Ordered    oxyCODONE -acetaminophen  (PERCOCET) 5-325 MG tablet  Every 4 hours PRN        09/22/23 10/07/52             Note:  This document was prepared using Dragon voice recognition software and may include unintentional dictation errors.   Nicholaus Rolland BRAVO, MD 09/22/23 [phone]

## 2023-09-22 NOTE — Discharge Instructions (Signed)
 You were seen in the emergency department for right-sided flank pain.  Workup demonstrated a small kidney stone.  You had no evidence of infection and you had no compromise of your kidney function.  This has likely passed but you may experience some spasm of the ureter for the next couple of days.  Please ensure adequate hydration.  You should not drive or operate machinery for the next 24 hours and you should not drive or operate machinery if you are taking your Percocet.  You declined a chest x-ray today so please follow-up with your primary care physician regarding your heartburn.  At this time I do not have a cause for your rash and I think you should follow-up with a dermatologist.  Return with any acutely worsening symptoms or any other emergency.  Ensure adequate fluid hydration -- RETURN PRECAUTIONS & AFTERCARE: (ENGLISH) RETURN PRECAUTIONS: Return immediately to the emergency department or see/call your doctor if you feel worse, weak or have changes in speech or vision, are short of breath, have fever, vomiting, pain, bleeding or dark stool, trouble urinating or any new issues. Return here or see/call your doctor if not improving as expected for your suspected condition. FOLLOW-UP CARE: Call your doctor and/or any doctors we referred you to for more advice and to make an appointment. Do this today, tomorrow or after the weekend. Some doctors only take PPO insurance so if you have HMO insurance you may want to contact your HMO or your regular doctor for referral to a specialist within your plan. Either way tell the doctor's office that it was a referral from the emergency department so you get the soonest possible appointment.  YOUR TEST RESULTS: Take result reports of any blood or urine tests, imaging tests and EKG's to your doctor and any referral doctor. Have any abnormal tests repeated. Your doctor or a referral doctor can let you know when this should be done. Also make sure your doctor contacts  this hospital to get any test results that are not currently available such as cultures or special tests for infection and final imaging reports, which are often not available at the time you leave the ER but which may list additional important findings that are not documented on the preliminary report. BLOOD PRESSURE: If your blood pressure was greater than 120/80 have your blood pressure rechecked within 1 to 2 weeks. MEDICATION SIDE EFFECTS: Do not drive, walk, bike, take the bus, etc. if you have received or are being prescribed any sedating medications such as those for pain or anxiety or certain antihistamines like Benadryl . If you have been give one of these here get a taxi home or have a friend drive you home. Ask your pharmacist to counsel you on potential side effects of any new medication

## 2023-09-22 NOTE — ED Triage Notes (Signed)
 Pt reports right flank pain that began earlier this morning, pt denies dysuria. Pt reports over past few days she has been constipated, had a BM yesterday after an enema. Pt also reports 1 episode of vomiting earlier today.

## 2023-09-22 NOTE — ED Notes (Signed)
 VO from [place], MD for zofran  4mg  IV... see MAR

## 2023-10-04 DIAGNOSIS — M722 Plantar fascial fibromatosis: Secondary | ICD-10-CM | POA: Diagnosis not present

## 2023-10-04 DIAGNOSIS — N200 Calculus of kidney: Secondary | ICD-10-CM | POA: Diagnosis not present

## 2023-10-04 DIAGNOSIS — M79671 Pain in right foot: Secondary | ICD-10-CM | POA: Diagnosis not present

## 2023-10-04 DIAGNOSIS — I480 Paroxysmal atrial fibrillation: Secondary | ICD-10-CM | POA: Diagnosis not present

## 2023-10-04 DIAGNOSIS — R5383 Other fatigue: Secondary | ICD-10-CM | POA: Diagnosis not present

## 2023-10-04 DIAGNOSIS — E538 Deficiency of other specified B group vitamins: Secondary | ICD-10-CM | POA: Diagnosis not present

## 2023-10-18 ENCOUNTER — Ambulatory Visit (HOSPITAL_COMMUNITY)
Admission: RE | Admit: 2023-10-18 | Discharge: 2023-10-18 | Disposition: A | Discharge status: Home / Self Care | Source: Ambulatory Visit | Attending: Internal Medicine | Admitting: Internal Medicine

## 2023-10-18 VITALS — BP 130/78 | HR 59 | Ht 65.0 in | Wt 221.2 lb

## 2023-10-18 DIAGNOSIS — I480 Paroxysmal atrial fibrillation: Secondary | ICD-10-CM

## 2023-10-18 DIAGNOSIS — Z79899 Other long term (current) drug therapy: Secondary | ICD-10-CM | POA: Diagnosis not present

## 2023-10-18 DIAGNOSIS — Z5181 Encounter for therapeutic drug level monitoring: Secondary | ICD-10-CM | POA: Diagnosis not present

## 2023-10-18 DIAGNOSIS — I4891 Unspecified atrial fibrillation: Secondary | ICD-10-CM | POA: Diagnosis not present

## 2023-10-18 MED ORDER — ATENOLOL 25 MG PO TABS: 25.0000 mg | ORAL_TABLET | Freq: Every day | ORAL | 6 refills | Status: DC

## 2023-10-18 NOTE — Progress Notes (Signed)
 Primary Care Physician: Marikay Eva POUR, PA Referring Physician:  Dr. Kelsie Heron DELENA Tracey Lin is a 63 y.o. female with a h/o paroxysmal afib, sleep apnea, treated with CPAP that is in the afib clinic for f/u hospitalization for afib while in [place], [place] to attend a [place] rally in 2019. She was walking to her car and  was aware of increased HR, dizziness and lightheadedness and was taken to [facility].  She had been out in the sun for about 30 mins and her fluid intake had been less than usual  for the day. Her HR was 170 bpm. She was thought to have mild dehydration.She was admitted, placed on Cardizem  drip for a couple of days then started on Multaq , ranexa , atenolol , eliquis  and did convert to SR prior to discharge. The MD told pt that she would need quick f/u as multaq  was for short term as it could cause liver failure. She is planning to see Dr. Kelsie soon. She would like to discuss coming off drugs and a possible ablation down the line. CHA2DS2VASc score is 1 for female. She would like to get clearance to have her knee surgery as she wants this first so she can get back to exercising to obtain wight loss. Echo was done at [facility] and with normal results( all records can be found in Care Everywhere).   She has had afib for many years but it had been quiet, treated in the [place] area until she moved to [place] 6 years ago. She saw Dr. Ammon a couple of times but has not seen him since 2015.  She was suppose to have knee surgery last week but it was cancelled.  She states that she has gained  40 lbs over the last 6 years. She does use cpap, but still sleeps very poorly. Feels that she does get at least 4 hours a night use.  F/u in afib clinic 9/9. When she saw Dr. Kelsie, he advised her to start weaning off the ranexa , multaq  and DOAC which she has successfully done. She  feels well. No further afib. She did have a SBO which self released right after I saw her last in  clinic. No further reoccurrence.   F/u in afib clinic 02/22/18, as she had a ER visit for afib and went to the ER for a cardioversion. She had multaq  and eliquis  on hand and took these but did not help to restore SR.  The cardioversion was successful and she is in SR today. She still would prefer to stay off daily meds . She has talked to Dr. Kelsie about an ablation in the past but is still not quite ready for this either unless afib burden increases. She is seeing a weight loss specialist. She has not used her cpap for sleep apnea in some time, cannot tolerate mask. She thinks the trigger was lack of sleep and working long hours.   F/u in AF clinic 07/11/18. Patient reports that last night she began having feelings of SOB, weakness, and heart racing. In office today she is in afib with RVR and is very SOB, especially when walking. She has not passed out but has also been very lightheaded. She is not on Eliquis  or Multaq  at this time.  She was sent to the ER for urgent cardioversion.  F/u afib clinic, 07/21/18. She is s/p successful  CV and is staying in rhythm. She remains on Multaq  and Eliquis  but does not want to  stay on daily long term. She cannot tolerate the cpap and states that she sleeps poorly. She has stopped going to the weight loss MD as it was inconvenient for her in [place]. She has gained weight with Covid and working from home.   F/u in afib clinic, 03/30/19. She  had return of rapid afib last night with difficulty breathing and lightheadedness and asked to be seen today. She  in in afib at 138 bpm. BP soft around 100 systolic.  She is not on anticoagulation  for a CHA2DS2VASc score of 1. Last time I saw her in June she required an urgent cardioversion. Was on Multaq  for a while but stopped in August 2020. She takes anticoagulation for the 4 weeks after cardioversion but then stops drug per protocol.  She did have an episode last week for a few hours of elevated HR but self converted. Discussed with  Dr. Kelsie and he felt she should proceed to the ER for an urgent cardioversion as she is not on anticoagulation and is in the 48 hours of onset afib. Has not used cpap for OSA in past.  F/u in afib clinic, 04/13/19.  after successful cardioversion in the ER. She continues on eliquis  5 mg bid for post cardioversion protocol. Discussion today if she prefers to go back on Multaq  bid to help prevent afib as she has had 2 cardioversions 6 months apart, or if she wants to discuss ablation with Dr. Kelsie. She prefers to discuss with Dr. Kelsie as she does not like to take meds. Will need updated echo.   F/u in afib clinic, 01/07/20. She had an appointment with Dr. Kelsie 3 months ago but no change in approach to afib management was made. She is in SR today. No  further afib episodes to report. She is in SR today. She continues on atenolol ,  CHA2DS2VASc score of 1, on daily asa and off eliquis  after 4 weeks following last  cardioversion. She previously took Multaq , it worked well but she did not want to continue daily meds. She continues use of CPAP. She would prefer not to pursue ablation at this time.   F/u in afib clinic, 04/23/19. She is now one month s/p afib ablation and is very happy with the results. She has not noted any afib. No swallowing or groin issues. She is back to her usual activities. Remains on eliquis  5 mg bid for a CHA2DS2VASc score of 1.   F/u in afib clinic, 04/01/21, as she reported having 2-3 breakthrough afib episodes. The longest was 3 days. She was under some additional stress at that time. She is in SR today. Sheis not oon anticoagulation by guidelines with a CHA2DS2VASc  score of 1.   F/u in afib clinic, 12/22/21, after having to report to ED for a heart rate in afib around 180 bpm and palpable BP at 90 systolic and being symptomatic with presyncope. She was started on anticoagulation and converted with IV cardizem . She has not had any further afib. She is pending an appointment with  Dr. Cindie 12/20 to discuss second ablation. With a CHA2DS2VASc  score of 1, she wishes to stop anticoagulation since it has been one month since she chemically returned to SR, knowing if she has an ablation she will need to start it back 3 weeks prior.   On follow up 05/21/22, she is currently in SR. She is now s/p Afib ablation and ablation of an atrial tachycardia in the right atrium on 04/23/22 by  Dr. Cindie. She was on flecainide  as a bridge prior to ablation and this was stopped after ablation. She went to the ED on 4/2 for acute left groin pain with no evidence of pseudoaneurysm or cellulitis and was begun on doxy and Keflex  for empiric coverage. Eliquis  held for 2 days due to new hematoma. Called 4/4 with pain under left breast and this was monitored. Seen by EP on 4/11 with improved groin pain and plan for no further antibiotic therapy.  Since OV on 4/11, she has been doing well. No episodes of Afib. Left groin has healed and she has no pain or fevers. Pain under left breast resolved. She is currently taking Lopressor  12.5 mg BID. No missed doses of Eliquis .   On follow up 06/28/22, she appears to be in atrial flutter with RVR. She took 1 dose of metoprolol  about 2 hours prior to office visit. She can feel palpitations and short of breath with exertion. No missed doses of Eliquis .  On follow up 11/10/22, she is currently in NSR. She is now s/p 2 ED visits on 6/3 and 9/2 which required cardioversion. She was seen by Dr. Cindie on 6/27 after our last visit (which prompted first ED cardioversion) and he recommended dofetilide should recurrence occur. No missed doses of Eliquis . She is very hesitant to proceed with dofetilide because she does not want to take additional medication. She has very infrequent alcohol intake, only social, and has not used her CPAP in a while.   On follow up 01/17/23, she is currently in NSR. Recent hospital admission 11/23-24/24 for Afib with RVR. She declined Tikosyn load  while in hospital and preferred to be restarted on flecainide ; notes show she preferred to schedule Tikosyn load as outpatient. She converted to NSR on IV diltiazem . She was discharged on flecainide  50 mg BID and Toprol  25 mg daily. She restarted Eliquis  5 mg BID immediately after noting she went into Afib. She has had zero episodes of Afib since hospital discharge. No missed doses of Eliquis .   On follow up 10/18/23, patient is here for flecainide  surveillance. She notes overall no Afib burden since last office visit. No missed doses of flecainide . Patient notes fatigue on metoprolol .  Today, she denies symptoms of palpitations, chest pain,+ shortness of breath/lightedness, no orthopnea, PND, lower extremity edema, dizziness, presyncope, syncope, or neurologic sequela. The patient is tolerating medications without difficulties and is otherwise without complaint today.   Past Medical History:  Diagnosis Date   Anxiety    B12 deficiency    Difficult intubation    SMALL TRACHEA   GERD (gastroesophageal reflux disease)    History of kidney stones    H/O   OSA (obstructive sleep apnea)    USES CPAP ( rarely)   Paroxysmal atrial fibrillation (HCC)    Past Surgical History:  Procedure Laterality Date   ATRIAL FIBRILLATION ABLATION N/A 03/25/2020   Procedure: ATRIAL FIBRILLATION ABLATION;  Surgeon: Kelsie Agent, MD;  Location: [facility];  Service: Cardiovascular;  Laterality: N/A;   ATRIAL FIBRILLATION ABLATION N/A 04/23/2022   Procedure: ATRIAL FIBRILLATION ABLATION;  Surgeon: Cindie Ole DASEN, MD;  Location: [facility] IN[facility] CV [facility];  Service: Cardiovascular;  Laterality: N/A;   AUGMENTATION MAMMAPLASTY  2003   BREAST BIOPSY Right 2005   benign   COLONOSCOPY     DILATATION & CURETTAGE/HYSTEROSCOPY WITH MYOSURE N/A 01/02/2018   Procedure: DILATATION & CURETTAGE/HYSTEROSCOPY WITH MYOSURE, polypectomy;  Surgeon: Verdon Keen, MD;  Location: [facility] ORS;  Service: Gynecology;  Laterality:  N/A;   ESOPHAGOGASTRODUODENOSCOPY     KNEE ARTHROSCOPY Left 12/02/2017   Procedure: ARTHROSCOPY KNEE WITH PARTIAL MEDIAL MENISECTOMY, REMOVAL OF LOOSE BODY;  Surgeon: Tobie Priest, MD;  Location: [facility] ORS;  Service: Orthopedics;  Laterality: Left;   REDUCTION MAMMAPLASTY  2007   TUMMY TUCK  2006    Current Outpatient Medications  Medication Sig Dispense Refill   ALPRAZolam  (XANAX ) 0.5 MG tablet Take 0.5-1 tablets (0.25-0.5 mg total) by mouth at bedtime as needed for anxiety. 30 tablet 0   apixaban  (ELIQUIS ) 5 MG TABS tablet Take 1 tablet (5 mg total) by mouth 2 (two) times daily. 60 tablet 5   flecainide  (TAMBOCOR ) 50 MG tablet Take 1 tablet (50 mg total) by mouth 2 (two) times daily. 60 tablet 5   MAGNESIUM  PO Take 1 tablet by mouth at bedtime.     metoprolol  succinate (TOPROL  XL) 25 MG 24 hr tablet Take 1 tablet (25 mg total) by mouth at bedtime. 90 tablet 3   No current facility-administered medications for this visit.    Allergies  Allergen Reactions   Codeine Nausea And Vomiting   Ivp Dye [Iodinated Contrast Media] Itching    ROS- All systems are reviewed and negative except as per the HPI above  Physical Exam: There were no vitals filed for this visit.    Wt Readings from Last 3 Encounters:  09/22/23 95.3 kg  01/17/23 97.2 kg  12/18/22 95.2 kg    Labs: Lab Results  Component Value Date   NA 139 09/22/2023   K 4.2 09/22/2023   CL 106 09/22/2023   CO2 21 (L) 09/22/2023   GLUCOSE 123 (H) 09/22/2023   BUN 22 09/22/2023   CREATININE 0.95 09/22/2023   CALCIUM 8.4 (L) 09/22/2023   MG 2.0 12/18/2022   Lab Results  Component Value Date   INR 1.13 08/17/2017   Lab Results  Component Value Date   CHOL 172 11/19/2021   HDL 38 (L) 11/19/2021   LDLCALC 115 (H) 11/19/2021   TRIG 94 11/19/2021   GEN- The patient is well appearing, alert and oriented x 3 today.   Neck - no JVD or carotid bruit noted Lungs- Clear to ausculation bilaterally, normal work of  breathing Heart- Regular rate and rhythm, no murmurs, rubs or gallops, PMI not laterally displaced Extremities- no clubbing, cyanosis, or edema Skin - no rash or ecchymosis noted   EKG -  Vent. rate 59 BPM PR interval 140 ms QRS duration 90 ms QT/QTcB 432/427 ms P-R-T axes 61 -3 42 Sinus bradycardia Otherwise normal ECG When compared with ECG of 22-Sep-2023 21:41, PREVIOUS ECG IS PRESENT  Echo 01/21/22: 1. Left ventricular ejection fraction, by estimation, is 55 to 60%. The  left ventricle has normal function. The left ventricle has no regional  wall motion abnormalities. Left ventricular diastolic parameters were  normal.   2. Right ventricular systolic function is normal. The right ventricular  size is normal.   3. The mitral valve is normal in structure. Trivial mitral valve  regurgitation. No evidence of mitral stenosis.   4. The aortic valve is normal in structure. Aortic valve regurgitation is  not visualized. No aortic stenosis is present.   5. The inferior vena cava is normal in size with greater than 50%  respiratory variability, suggesting right atrial pressure of 3 mmHg.   Assessment and Plan: 1.Symptomatic paroxysmal afib with RVR S/p ablation on 04/23/22. S/p successful DCCV in the ED on 06/28/22. S/p successful DCCV in the  ED on 09/27/22. S/p hospital admission 11/23-24/24 for Afib with RVR.  Patient is currently in NSR. Per patient preference, would like to transition away from metoprolol  and feels like historically atenolol  helped her without side effects. Stop metoprolol  and begin atenolol  25 mg daily.  High risk medication monitoring (ICD10: [id number]) Patient requires ongoing monitoring for anti-arrhythmic medication which has the potential to cause life threatening arrhythmias or AV block. ECG intervals are stable. Continue flecainide  50 mg BID. Take atenolol  25 mg daily.     Follow up 6 months for flecainide  surveillance.   Fairy Heinrich, PA-C Afib  Clinic [facility] [address] [place], [place] [address] [phone]

## 2023-10-18 NOTE — Patient Instructions (Signed)
 Stop metorprolol Stop Eliquis   Start atenolol  25 mg once a day

## 2023-12-12 DIAGNOSIS — M79671 Pain in right foot: Secondary | ICD-10-CM | POA: Diagnosis not present

## 2023-12-12 DIAGNOSIS — M722 Plantar fascial fibromatosis: Secondary | ICD-10-CM | POA: Diagnosis not present

## 2023-12-14 ENCOUNTER — Other Ambulatory Visit: Payer: Self-pay

## 2023-12-14 DIAGNOSIS — M722 Plantar fascial fibromatosis: Secondary | ICD-10-CM

## 2023-12-19 ENCOUNTER — Other Ambulatory Visit (HOSPITAL_COMMUNITY): Payer: Self-pay | Admitting: *Deleted

## 2023-12-19 ENCOUNTER — Other Ambulatory Visit: Payer: Self-pay

## 2023-12-19 ENCOUNTER — Ambulatory Visit
Admission: RE | Admit: 2023-12-19 | Discharge: 2023-12-19 | Disposition: A | Discharge status: Home / Self Care | Source: Ambulatory Visit

## 2023-12-19 DIAGNOSIS — M722 Plantar fascial fibromatosis: Secondary | ICD-10-CM

## 2023-12-19 MED ORDER — FLECAINIDE ACETATE 50 MG PO TABS: 50.0000 mg | ORAL_TABLET | Freq: Two times a day (BID) | ORAL | 3 refills | Status: AC

## 2023-12-19 MED ORDER — ATENOLOL 25 MG PO TABS: 25.0000 mg | ORAL_TABLET | Freq: Every day | ORAL | 3 refills | Status: AC

## 2023-12-29 DIAGNOSIS — M25571 Pain in right ankle and joints of right foot: Secondary | ICD-10-CM | POA: Diagnosis not present

## 2023-12-29 DIAGNOSIS — M6281 Muscle weakness (generalized): Secondary | ICD-10-CM | POA: Diagnosis not present

## 2024-01-05 DIAGNOSIS — M25571 Pain in right ankle and joints of right foot: Secondary | ICD-10-CM | POA: Diagnosis not present

## 2024-01-05 DIAGNOSIS — M6281 Muscle weakness (generalized): Secondary | ICD-10-CM | POA: Diagnosis not present

## 2024-01-09 DIAGNOSIS — M6281 Muscle weakness (generalized): Secondary | ICD-10-CM | POA: Diagnosis not present

## 2024-01-09 DIAGNOSIS — D649 Anemia, unspecified: Secondary | ICD-10-CM | POA: Diagnosis not present

## 2024-01-09 DIAGNOSIS — Z131 Encounter for screening for diabetes mellitus: Secondary | ICD-10-CM | POA: Diagnosis not present

## 2024-01-09 DIAGNOSIS — M25571 Pain in right ankle and joints of right foot: Secondary | ICD-10-CM | POA: Diagnosis not present

## 2024-01-09 DIAGNOSIS — Z0000 Encounter for general adult medical examination without abnormal findings: Secondary | ICD-10-CM | POA: Diagnosis not present

## 2024-01-09 DIAGNOSIS — E538 Deficiency of other specified B group vitamins: Secondary | ICD-10-CM | POA: Diagnosis not present

## 2024-01-11 DIAGNOSIS — M25571 Pain in right ankle and joints of right foot: Secondary | ICD-10-CM | POA: Diagnosis not present

## 2024-01-11 DIAGNOSIS — M6281 Muscle weakness (generalized): Secondary | ICD-10-CM | POA: Diagnosis not present

## 2024-01-16 ENCOUNTER — Other Ambulatory Visit: Payer: Self-pay | Admitting: Physician Assistant

## 2024-01-16 DIAGNOSIS — E559 Vitamin D deficiency, unspecified: Secondary | ICD-10-CM | POA: Diagnosis not present

## 2024-01-16 DIAGNOSIS — Z1231 Encounter for screening mammogram for malignant neoplasm of breast: Secondary | ICD-10-CM

## 2024-03-23 ENCOUNTER — Ambulatory Visit: Admit: 2024-03-23

## 2024-03-23 SURGERY — COLONOSCOPY
Anesthesia: General

## 2024-04-18 ENCOUNTER — Ambulatory Visit (HOSPITAL_COMMUNITY): Admitting: Internal Medicine
